# Patient Record
Sex: Female | Born: 1980 | Race: White | Hispanic: No | Marital: Married | Smoking: Former smoker
Health system: Southern US, Community
[De-identification: ages and names within clinical notes are randomized; demographics above are authoritative.]

## PROBLEM LIST (undated history)

## (undated) ENCOUNTER — Inpatient Hospital Stay (HOSPITAL_COMMUNITY): Payer: Self-pay

## (undated) DIAGNOSIS — R079 Chest pain, unspecified: Secondary | ICD-10-CM

## (undated) DIAGNOSIS — G8929 Other chronic pain: Secondary | ICD-10-CM

## (undated) DIAGNOSIS — F909 Attention-deficit hyperactivity disorder, unspecified type: Secondary | ICD-10-CM

## (undated) DIAGNOSIS — B019 Varicella without complication: Secondary | ICD-10-CM

## (undated) DIAGNOSIS — Z62810 Personal history of physical and sexual abuse in childhood: Secondary | ICD-10-CM

## (undated) DIAGNOSIS — D649 Anemia, unspecified: Secondary | ICD-10-CM

## (undated) DIAGNOSIS — R519 Headache, unspecified: Secondary | ICD-10-CM

## (undated) DIAGNOSIS — F419 Anxiety disorder, unspecified: Secondary | ICD-10-CM

## (undated) DIAGNOSIS — F411 Generalized anxiety disorder: Secondary | ICD-10-CM

## (undated) DIAGNOSIS — F329 Major depressive disorder, single episode, unspecified: Secondary | ICD-10-CM

## (undated) DIAGNOSIS — N390 Urinary tract infection, site not specified: Secondary | ICD-10-CM

## (undated) DIAGNOSIS — Z720 Tobacco use: Secondary | ICD-10-CM

## (undated) DIAGNOSIS — R202 Paresthesia of skin: Secondary | ICD-10-CM

## (undated) DIAGNOSIS — Z8489 Family history of other specified conditions: Secondary | ICD-10-CM

## (undated) HISTORY — DX: Headache, unspecified: R51.9

## (undated) HISTORY — DX: Attention-deficit hyperactivity disorder, unspecified type: F90.9

## (undated) HISTORY — PX: HERNIA REPAIR: SHX51

## (undated) HISTORY — DX: Personal history of physical and sexual abuse in childhood: Z62.810

## (undated) HISTORY — DX: Major depressive disorder, single episode, unspecified: F32.9

## (undated) HISTORY — DX: Urinary tract infection, site not specified: N39.0

## (undated) HISTORY — DX: Chest pain, unspecified: R07.9

## (undated) HISTORY — DX: Headache, unspecified: G89.29

## (undated) HISTORY — DX: Varicella without complication: B01.9

## (undated) HISTORY — DX: Generalized anxiety disorder: F41.1

## (undated) HISTORY — PX: WISDOM TOOTH EXTRACTION: SHX21

---
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
## 2002-02-08 ENCOUNTER — Encounter: Admission: RE | Admit: 2002-02-08 | Discharge: 2002-03-10 | Payer: Self-pay | Admitting: Obstetrics and Gynecology

## 2002-04-10 ENCOUNTER — Encounter: Admission: RE | Admit: 2002-04-10 | Discharge: 2002-05-10 | Payer: Self-pay | Admitting: Obstetrics and Gynecology

## 2002-05-11 ENCOUNTER — Encounter: Admission: RE | Admit: 2002-05-11 | Discharge: 2002-06-10 | Payer: Self-pay | Admitting: Obstetrics and Gynecology

## 2002-07-11 ENCOUNTER — Encounter: Admission: RE | Admit: 2002-07-11 | Discharge: 2002-08-10 | Payer: Self-pay | Admitting: Obstetrics and Gynecology

## 2003-05-06 ENCOUNTER — Emergency Department (HOSPITAL_COMMUNITY): Admission: EM | Admit: 2003-05-06 | Discharge: 2003-05-06 | Payer: Self-pay | Admitting: *Deleted

## 2003-06-05 ENCOUNTER — Emergency Department (HOSPITAL_COMMUNITY): Admission: EM | Admit: 2003-06-05 | Discharge: 2003-06-05 | Payer: Self-pay | Admitting: Emergency Medicine

## 2003-07-05 ENCOUNTER — Emergency Department (HOSPITAL_COMMUNITY): Admission: EM | Admit: 2003-07-05 | Discharge: 2003-07-05 | Payer: Self-pay

## 2003-10-22 ENCOUNTER — Emergency Department (HOSPITAL_COMMUNITY): Admission: EM | Admit: 2003-10-22 | Discharge: 2003-10-22 | Payer: Self-pay | Admitting: Family Medicine

## 2004-11-16 ENCOUNTER — Other Ambulatory Visit: Admission: RE | Admit: 2004-11-16 | Discharge: 2004-11-16 | Payer: Self-pay | Admitting: Obstetrics and Gynecology

## 2005-05-19 ENCOUNTER — Inpatient Hospital Stay (HOSPITAL_COMMUNITY): Admission: AD | Admit: 2005-05-19 | Discharge: 2005-05-20 | Payer: Self-pay | Admitting: Obstetrics and Gynecology

## 2005-06-02 ENCOUNTER — Inpatient Hospital Stay (HOSPITAL_COMMUNITY): Admission: AD | Admit: 2005-06-02 | Discharge: 2005-06-03 | Payer: Self-pay | Admitting: Obstetrics and Gynecology

## 2005-12-21 ENCOUNTER — Other Ambulatory Visit: Admission: RE | Admit: 2005-12-21 | Discharge: 2005-12-21 | Payer: Self-pay | Admitting: Obstetrics and Gynecology

## 2012-01-06 ENCOUNTER — Encounter: Payer: Self-pay | Admitting: Obstetrics and Gynecology

## 2012-01-06 ENCOUNTER — Ambulatory Visit (INDEPENDENT_AMBULATORY_CARE_PROVIDER_SITE_OTHER): Payer: Self-pay | Admitting: Obstetrics and Gynecology

## 2012-01-06 VITALS — BP 102/70 | Resp 16 | Ht 63.0 in | Wt 134.0 lb

## 2012-01-06 DIAGNOSIS — Z124 Encounter for screening for malignant neoplasm of cervix: Secondary | ICD-10-CM

## 2012-01-06 DIAGNOSIS — F341 Dysthymic disorder: Secondary | ICD-10-CM

## 2012-01-06 DIAGNOSIS — F329 Major depressive disorder, single episode, unspecified: Secondary | ICD-10-CM | POA: Insufficient documentation

## 2012-01-06 DIAGNOSIS — F418 Other specified anxiety disorders: Secondary | ICD-10-CM

## 2012-01-06 DIAGNOSIS — G43909 Migraine, unspecified, not intractable, without status migrainosus: Secondary | ICD-10-CM

## 2012-01-06 HISTORY — DX: Migraine, unspecified, not intractable, without status migrainosus: G43.909

## 2012-01-06 MED ORDER — NORETHIN ACE-ETH ESTRAD-FE 1-20 MG-MCG(24) PO TABS: 1.0000 | ORAL_TABLET | Freq: Every day | ORAL | Status: DC

## 2012-01-06 MED ORDER — IBUPROFEN 800 MG PO TABS: 800.0000 mg | ORAL_TABLET | Freq: Three times a day (TID) | ORAL | Status: AC | PRN

## 2012-01-06 MED ORDER — CLONAZEPAM 0.5 MG PO TABS: 0.5000 mg | ORAL_TABLET | Freq: Two times a day (BID) | ORAL | Status: DC | PRN

## 2012-01-06 NOTE — Progress Notes (Signed)
Subjective:    Heather [name] is a 31 y.o. female, No obstetric history on file., who presents for an annual exam.  No problems gynecologically, but did experience loss of father to sudden death 2 months ago.  Autopsy results are pending. Wants to restart OCPs--husband wants another baby, patient does not at this time.  Previously used LoEstrin 24 with good results. Menstrual migraines well-controlled with Ibuprophen 800 mg.  No current insurance--requests I refill Klonpin as well as Rx Loestrin and Ibuprophen, On OTC weight loss product "Oxylite".  Still smoking--working on quitting.   History   Social History  . Marital Status: Married    Spouse Name: N/A    Number of Children: N/A  . Years of Education: N/A   Social History Main Topics  . Smoking status: Current Everyday Smoker -- 1.0 packs/day    Types: Cigarettes  . Smokeless tobacco: Never Used  . Alcohol Use: No  . Drug Use: No  . Sexually Active: Yes    Birth Control/ Protection: None   Other Topics Concern  . None   Social History Narrative  . None    Menstrual cycle:   LMP: [name] last menstrual period was 12/19/2011.           Cycle: flow is moderate and Regular, monthly with normal flow and no severe dysmenorrha  The following portions of the patient's history were reviewed and updated as appropriate: allergies, current medications, past family history, past medical history, past social history, past surgical history and problem list.  Review of Systems Pertinent items are noted in HPI. Breast:Negative for breast lump,nipple discharge or nipple retraction Gastrointestinal: Negative for abdominal pain, change in bowel habits or rectal bleeding Urinary:negative   Objective:    BP 102/70  Resp 16  Ht 5\' 3"  (1.6 m)  Wt 134 lb (60.782 kg)  BMI 23.74 kg/m2  LMP 12/19/2011    Weight:  Wt Readings from Last 1 Encounters:  01/06/12 134 lb (60.782 kg)          BMI: Body mass index is 23.74 kg/(m^2).  General  Appearance: Alert, appropriate appearance for age. No acute distress HEENT: Grossly normal Neck / Thyroid: Supple, no masses, nodes or enlargement Lungs: clear to auscultation bilaterally Back: No CVA tenderness Breast Exam: No dimpling, nipple retraction or discharge. No masses or nodes. and No masses or nodes.No dimpling, nipple retraction or discharge. Cardiovascular: Regular rate and rhythm. S1, S2, no murmur Gastrointestinal: Soft, non-tender, no masses or organomegaly Pelvic Exam: Vulva and vagina appear normal. Bimanual exam reveals normal uterus and adnexa. Rectovaginal: not indicated and normal rectal, no masses Lymphatic Exam: Non-palpable nodes in neck, clavicular, axillary, or inguinal regions Skin: no rash or abnormalities Neurologic: Normal gait and speech, no tremor  Psychiatric: Alert and oriented, appropriate affect.   Wet Prep:not applicable Urinalysis:not applicable UPT: Not done   Assessment:    Normal gyn exam  Anxiety/depression--stable on Klonipin Menstrual migraines--stable on Ibuprophen Wants to restart OCP (Loestrin 24)   Plan:    pap smear return annually or prn STD screening: declined Contraception:oral contraceptives (estrogen/progesterone)--Rx Loestrin 24 x 1 year Rx Klonipin 0.05 mg po BID, #60, with 5 refills Rx Ibuprophen 800 mg po TID prn dysmenorrhea. Encouraged smoking cesation Support to patient for loss of father.      Doralyn Kirkes, VICKIMD

## 2012-01-06 NOTE — Progress Notes (Addendum)
Allergies: NKDA LMP: 12/19/2011 Last Pap: 2011/Normal Contraception: None Gravida: 2 Para: 2 Primary Doctor: None  ANNUAL EXAM  Regular Periods yes   Monthly Breast Ex. no Tetanus<10 years ? NI. Bladder Function yes Daily BM's no Healthy Diet no Calcium/Multi-vitamin no Mammogram no Date: Smoker yes How much? 1 pack a day Alcohol Abuse no How much? Substance Abuse no How much? Exercise yes How much? 3-4 x's weekly Seatbelts yes Abuse at Home no Stressful Work yes Sigmoid/Colonoscopy in past 5 years no Medications: Oxy-Elite diet supplement  Medical problems this year: None  PMH: None  FMH: Mother has neuropathy; Father deceased ( cause unknown)  ABDOMINAL/PELVIC PAIN no  VAGINAL DISCHARGE no  IRREGULAR BLEEDING no  MENOPAUSAL SYMPTOMS no

## 2012-01-10 LAB — PAP IG W/ RFLX HPV ASCU

## 2012-02-09 ENCOUNTER — Telehealth: Payer: Self-pay | Admitting: Obstetrics and Gynecology

## 2012-02-09 NOTE — Telephone Encounter (Signed)
TC to pt.   States just started OCP 2 weeks ago after having being off x 2 years.  Has had bleeding on and off using at most 2 tampons/day. Neg UPT x 2.   Explained may have irregular bleeding x 3 months. Has not used back up contraception. Advised to do so. Advised to continue pills.   May have full period during week of inactive pills.  To repeat UPT prior to starting new pack.  Pt verbalizes comprehension.

## 2012-02-09 NOTE — Telephone Encounter (Signed)
Triage/cht received 

## 2012-05-15 ENCOUNTER — Other Ambulatory Visit: Payer: Self-pay | Admitting: Obstetrics and Gynecology

## 2012-05-15 ENCOUNTER — Telehealth: Payer: Self-pay | Admitting: Obstetrics and Gynecology

## 2012-05-15 MED ORDER — NORETHIN ACE-ETH ESTRAD-FE 1-20 MG-MCG(24) PO TABS: 1.0000 | ORAL_TABLET | Freq: Every day | ORAL | Status: DC

## 2012-05-15 MED ORDER — NORETHIN ACE-ETH ESTRAD-FE 1-20 MG-MCG(24) PO CHEW: 1.0000 | CHEWABLE_TABLET | Freq: Every day | ORAL | Status: DC

## 2012-05-15 NOTE — Telephone Encounter (Signed)
Pt called regarding msg, informed Loestrin 24 has changed its name to Minastrin 24 Fe, pt states has missed 2 days of pills and says the pharmacy was not aware that Loestrin 24 Fe was discontinued.  Informed pt reordered rx as Minastrin and will send discount through the mail.  Pt also advised to use BUM while getting back on track, pt states she does not use BUMs, pt warned will be at risk of getting pregnant and she is aware of that.  Pt says if she gets pregnant,"she will sue the pharmacy b/c they did not tell her about the changes in the contraception.

## 2012-05-15 NOTE — Telephone Encounter (Signed)
TRIAGE/BC [facility]

## 2013-05-26 ENCOUNTER — Inpatient Hospital Stay (HOSPITAL_COMMUNITY)
Admission: AD | Admit: 2013-05-26 | Discharge: 2013-05-26 | Disposition: A | Discharge status: Home / Self Care | Payer: Self-pay | Source: Ambulatory Visit | Attending: Obstetrics and Gynecology | Admitting: Obstetrics and Gynecology

## 2013-05-26 ENCOUNTER — Encounter (HOSPITAL_COMMUNITY): Payer: Self-pay | Admitting: *Deleted

## 2013-05-26 DIAGNOSIS — O26852 Spotting complicating pregnancy, second trimester: Secondary | ICD-10-CM

## 2013-05-26 DIAGNOSIS — O26859 Spotting complicating pregnancy, unspecified trimester: Secondary | ICD-10-CM | POA: Insufficient documentation

## 2013-05-26 HISTORY — DX: Anxiety disorder, unspecified: F41.9

## 2013-05-26 LAB — WET PREP, GENITAL
Clue Cells Wet Prep HPF POC: NONE SEEN
Trich, Wet Prep: NONE SEEN
Yeast Wet Prep HPF POC: NONE SEEN

## 2013-05-26 NOTE — Discharge Instructions (Signed)
Vaginal Bleeding During Pregnancy, Second Trimester  A small amount of bleeding (spotting) is relatively common in pregnancy. It usually stops on its own. There are many causes for bleeding or spotting in pregnancy. Some bleeding may be related to the pregnancy and some may not. Cramping with the bleeding is more serious and concerning. Tell your caregiver if you have any vaginal bleeding.   CAUSES    Infection, inflammation or growths on the cervix.   The placenta may partially or completely be covering the opening of the cervix inside the uterus.   The placenta may have separated from the uterus.   You may be having early/preterm labor.   The cervix is not strong enough to keep a baby inside the uterus (cervical insufficiency).   Many tiny cysts in the uterus instead of pregnancy tissue (molar pregnancy)  SYMPTOMS    Vaginal spotting or bleeding with or without cramps.   Uterine contractions.   Abnormal vaginal discharge.   You may have spotting or spotting after having sexual intercourse.  DIAGNOSIS   To evaluate the pregnancy, your caregiver may:   Do a pelvic exam.   Take blood tests.   Do an ultrasound.  It is very important to follow your caregiver's instructions.   TREATMENT    Evaluation of the pregnancy with blood tests and ultrasound.   Bed rest (getting up to use the bathroom only).   Rho-gam immunization if the mother is Rh negative and the father is Rh positive.   If you are having uterine contractions, you may be given medication to stop the contractions.   If you have cervical insufficiency, you may have a suture placed in the cervix to close it.  HOME CARE INSTRUCTIONS    If your caregiver orders bed rest, you may need to make arrangements for the care of other children and for any other responsibilities. However, your caregiver may allow you to continue light activity.   Keep track of the number of pads you use each day and how soaked (saturated) they are. Write this down.   Do  not use tampons. Do not douche.   Do not have sexual intercourse or orgasms until approved by your physician.   Save any tissue that you pass for your caregiver to see.   Take medicine for cramps only with your caregiver's permission.   Do not take aspirin because it can make you bleed.   Do not exercise, do any strenuous activities or heavy lifting without your caregiver's permission.  SEEK IMMEDIATE MEDICAL CARE IF:    You experience severe cramps in your stomach, back or belly (abdomen).   You have uterine contractions.   You have an oral temperature above 102 F (38.9 C), not controlled by medicine.   You develop chills.   You pass large clots or tissue.   Your bleeding increases or you become light-headed, weak or have fainting episodes.   You have leaking or a gush of fluid from your vagina.  Document Released: 06/01/2005 Document Revised: 11/14/2011 Document Reviewed: 12/11/2008  [employer] Patient Information 2014 [employer].

## 2013-05-26 NOTE — MAU Provider Note (Signed)
History   32yo, [id number] at [redacted]w[redacted]d presents with a small amount of dark spotting after intercourse.  Spotting was accompanied by a small dark clot.  By the time she arrived to MAU bleeding had stopped. Pt also reports baby had not moved as much since intercourse, however moving well in MAU.  Denies cramping, UCs, recent fever, resp or GI c/o's, UTI or PIH s/s.   Chief Complaint  Patient presents with  . Vaginal Bleeding    OB History   Grav Para Term Preterm Abortions TAB SAB Ect Mult Living   3 2 2       2       Past Medical History  Diagnosis Date  . Anxiety     stopped meds with pregnancy    Past Surgical History  Procedure Laterality Date  . Hernia repair      when 8 wks old  . Wisdom tooth extraction      History reviewed. No pertinent family history.  History  Substance Use Topics  . Smoking status: Current Every Day Smoker -- 1.00 packs/day    Types: Cigarettes  . Smokeless tobacco: Never Used  . Alcohol Use: No    Allergies: No Known Allergies  No prescriptions prior to admission    ROS: see HPI above, all other systems are negative   Physical Exam   Blood pressure 106/52, pulse 66, temperature 97.8 F (36.6 C), resp. rate 20, height 5\' 3"  (1.6 m), weight 146 lb 9.6 oz (66.497 kg).  Chest: Clear Heart: RRR Abdomen: gravid, NT Extremities: WNL  Pelvic exam: normal external genitalia, vulva, vagina, cervix, uterus and adnexa.  Small amount of brown discharge noted in vaginal vault.  FHT: 140 Doppler UCs: none  ED Course  IUP at [redacted]w[redacted]d Spotting after IC  Wet prep - neg  D/c home with precautions F/u at already scheduled appt on Thursday  Haroldine Laws CNM, MSN 05/26/2013 1:46 AM
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
## 2013-07-20 ENCOUNTER — Encounter (HOSPITAL_COMMUNITY): Payer: Self-pay

## 2013-07-20 ENCOUNTER — Inpatient Hospital Stay (HOSPITAL_COMMUNITY)
Admission: AD | Admit: 2013-07-20 | Discharge: 2013-07-20 | Disposition: A | Discharge status: Home / Self Care | Payer: Medicaid Other | Source: Ambulatory Visit | Attending: Obstetrics & Gynecology | Admitting: Obstetrics & Gynecology

## 2013-07-20 DIAGNOSIS — O4690 Antepartum hemorrhage, unspecified, unspecified trimester: Secondary | ICD-10-CM | POA: Insufficient documentation

## 2013-07-20 DIAGNOSIS — O99330 Smoking (tobacco) complicating pregnancy, unspecified trimester: Secondary | ICD-10-CM | POA: Insufficient documentation

## 2013-07-20 DIAGNOSIS — N930 Postcoital and contact bleeding: Secondary | ICD-10-CM

## 2013-07-20 NOTE — MAU Provider Note (Signed)
  History     CSN: [id number]  Arrival date and time: 07/20/13 7829   First Provider Initiated Contact with Patient 07/20/13 0555      Chief Complaint  Patient presents with  . Vaginal Bleeding   HPI Ms Heather [name] is a 32yo G3P2002 at 29.1wks who presents for eval of post coital vag bldg tonight. Denies leaking, ctx, or pain. Reports +FM. Her preg has been followed by [facility] until approx 2 mos ago when she lost her job/insurance; has applied for MCD. Denies any concerns up until that point with her preg. Bld type O+ per pt.  OB History   Grav Para Term Preterm Abortions TAB SAB Ect Mult Living   3 2 2       2       Past Medical History  Diagnosis Date  . Anxiety     stopped meds with pregnancy    Past Surgical History  Procedure Laterality Date  . Hernia repair      when 8 wks old  . Wisdom tooth extraction      History reviewed. No pertinent family history.  History  Substance Use Topics  . Smoking status: Current Every Day Smoker -- 1.00 packs/day    Types: Cigarettes  . Smokeless tobacco: Never Used  . Alcohol Use: No    Allergies: No Known Allergies  Prescriptions prior to admission  Medication Sig Dispense Refill  . acetaminophen (TYLENOL) 500 MG tablet Take 500 mg by mouth every 6 (six) hours as needed for pain.      . Prenatal Vit-Fe Fumarate-FA (PRENATAL MULTIVITAMIN) TABS tablet Take 1 tablet by mouth daily at 12 noon.        ROS Physical Exam   Blood pressure 116/59, pulse 91, temperature 98.3 F (36.8 C), temperature source Oral, resp. rate 16, height 5\' 3"  (1.6 m), weight 69.967 kg (154 lb 4 oz), SpO2 98.00%.  Physical Exam  Constitutional: She is oriented to person, place, and time. She appears well-developed.  HENT:  Head: Normocephalic.  Neck: Normal range of motion.  Cardiovascular: Normal rate.   Respiratory: Effort normal.  GI:  EFM 125-135 +accels, no decels No ctx per toco  Genitourinary:  SE: sm red bld in vault; no active bldg; cx  C/L  Musculoskeletal: Normal range of motion.  Neurological: She is alert and oriented to person, place, and time.  Skin: Skin is warm and dry.  Psychiatric: She has a normal mood and affect. Her behavior is normal. Thought content normal.    MAU Course  Procedures    Assessment and Plan  IUP at 29.1wks Post coital bldg  D/C home with pelvic rest x 7d Instructed on what to expect as far as seeing old blood x next few days- return for ctx or BRB Rec to F/U with [facility] to reestablish [facility], but given # for [facility] office (boyfriend lives near there) if she is in need of a provider  Cam Hai 07/20/2013, 6:08 AM

## 2013-07-20 NOTE — MAU Note (Signed)
Patient is in with c/o vaginal bleeding while having intercourse. She does not have a pad on now, unable to assess bleeding. A pad given to patient. Patient denies any pain. She reports good fetal movement. Patient states that she last went to [facility] 2 months ago. She was dismissed from that practice because she lost her insurance and is awaiting medicaid.

## 2013-08-22 ENCOUNTER — Encounter: Payer: Self-pay | Admitting: Obstetrics and Gynecology

## 2013-08-22 ENCOUNTER — Ambulatory Visit (INDEPENDENT_AMBULATORY_CARE_PROVIDER_SITE_OTHER): Payer: Medicaid Other | Admitting: Obstetrics and Gynecology

## 2013-08-22 VITALS — BP 121/69 | Wt 156.2 lb

## 2013-08-22 DIAGNOSIS — O99333 Smoking (tobacco) complicating pregnancy, third trimester: Secondary | ICD-10-CM

## 2013-08-22 DIAGNOSIS — Z87891 Personal history of nicotine dependence: Secondary | ICD-10-CM | POA: Insufficient documentation

## 2013-08-22 DIAGNOSIS — Z3480 Encounter for supervision of other normal pregnancy, unspecified trimester: Secondary | ICD-10-CM

## 2013-08-22 DIAGNOSIS — Z3483 Encounter for supervision of other normal pregnancy, third trimester: Secondary | ICD-10-CM

## 2013-08-22 DIAGNOSIS — Z720 Tobacco use: Secondary | ICD-10-CM

## 2013-08-22 DIAGNOSIS — O99334 Smoking (tobacco) complicating childbirth: Secondary | ICD-10-CM

## 2013-08-22 DIAGNOSIS — F17200 Nicotine dependence, unspecified, uncomplicated: Secondary | ICD-10-CM

## 2013-08-22 DIAGNOSIS — O99330 Smoking (tobacco) complicating pregnancy, unspecified trimester: Secondary | ICD-10-CM | POA: Insufficient documentation

## 2013-08-22 HISTORY — DX: Tobacco use: Z72.0

## 2013-08-22 NOTE — Progress Notes (Signed)
Patient transferred care from [facility] at 33w6. Patient doing well without complaints. Reports uncomplicated prenatal care. Some records available. Patient has not had prenatal care since late August. She reports an incomplete anatomy ultrasound as she was suppose to follow up with them to complete ultrasound but lost her insurance. Patient has not had 1 hr glucola test today and will return tomorrow to have it done. Patient reports some [facility] and would like to have her cervix checked today. She is very anxious as this is her first pregnancy with her current boyfriend and her last child is 21 years old. Reassurance provided. Anatomy ultrasound ordered

## 2013-08-22 NOTE — Progress Notes (Signed)
p-80 

## 2013-08-23 ENCOUNTER — Other Ambulatory Visit (INDEPENDENT_AMBULATORY_CARE_PROVIDER_SITE_OTHER): Payer: Medicaid Other | Admitting: *Deleted

## 2013-08-23 DIAGNOSIS — Z3480 Encounter for supervision of other normal pregnancy, unspecified trimester: Secondary | ICD-10-CM

## 2013-08-23 DIAGNOSIS — Z3403 Encounter for supervision of normal first pregnancy, third trimester: Secondary | ICD-10-CM

## 2013-08-23 NOTE — Progress Notes (Unsigned)
Pt here today for 1 hr GTT.

## 2013-08-24 LAB — CBC WITH DIFFERENTIAL/PLATELET
Basophils Relative: 0 % (ref 0–1)
Eosinophils Absolute: 0.2 10*3/uL (ref 0.0–0.7)
Hemoglobin: 11 g/dL — ABNORMAL LOW (ref 12.0–15.0)
Lymphs Abs: 2.2 10*3/uL (ref 0.7–4.0)
MCH: 30.3 pg (ref 26.0–34.0)
MCV: 88.4 fL (ref 78.0–100.0)
Monocytes Absolute: 1.1 10*3/uL — ABNORMAL HIGH (ref 0.1–1.0)
Monocytes Relative: 11 % (ref 3–12)
Neutrophils Relative %: 65 % (ref 43–77)
RBC: 3.63 MIL/uL — ABNORMAL LOW (ref 3.87–5.11)

## 2013-08-24 LAB — GLUCOSE TOLERANCE, 1 HOUR (50G) W/O FASTING: Glucose, 1 Hour GTT: 106 mg/dL (ref 70–140)

## 2013-08-24 LAB — RPR

## 2013-08-27 ENCOUNTER — Encounter: Payer: Self-pay | Admitting: Obstetrics and Gynecology

## 2013-08-27 ENCOUNTER — Ambulatory Visit (HOSPITAL_COMMUNITY)
Admission: RE | Admit: 2013-08-27 | Discharge: 2013-08-27 | Disposition: A | Discharge status: Home / Self Care | Payer: Medicaid Other | Source: Ambulatory Visit | Attending: Obstetrics and Gynecology | Admitting: Obstetrics and Gynecology

## 2013-08-27 DIAGNOSIS — Z3483 Encounter for supervision of other normal pregnancy, third trimester: Secondary | ICD-10-CM

## 2013-08-27 DIAGNOSIS — O0930 Supervision of pregnancy with insufficient antenatal care, unspecified trimester: Secondary | ICD-10-CM | POA: Insufficient documentation

## 2013-08-27 DIAGNOSIS — Z3689 Encounter for other specified antenatal screening: Secondary | ICD-10-CM | POA: Insufficient documentation

## 2013-09-03 ENCOUNTER — Encounter: Payer: Self-pay | Admitting: Family Medicine

## 2013-09-03 ENCOUNTER — Ambulatory Visit (INDEPENDENT_AMBULATORY_CARE_PROVIDER_SITE_OTHER): Payer: Medicaid Other | Admitting: Family Medicine

## 2013-09-03 VITALS — BP 106/56 | Wt 156.0 lb

## 2013-09-03 DIAGNOSIS — Z20828 Contact with and (suspected) exposure to other viral communicable diseases: Secondary | ICD-10-CM

## 2013-09-03 DIAGNOSIS — Z3483 Encounter for supervision of other normal pregnancy, third trimester: Secondary | ICD-10-CM

## 2013-09-03 DIAGNOSIS — Z23 Encounter for immunization: Secondary | ICD-10-CM

## 2013-09-03 DIAGNOSIS — Z3480 Encounter for supervision of other normal pregnancy, unspecified trimester: Secondary | ICD-10-CM

## 2013-09-03 MED ORDER — OSELTAMIVIR PHOSPHATE 75 MG PO CAPS: 75.0000 mg | ORAL_CAPSULE | Freq: Two times a day (BID) | ORAL | Status: DC

## 2013-09-03 NOTE — Progress Notes (Signed)
TDaP and Flu shot today Labor precautions 28 wks labs and sonogram reviewed and WNL. Cultures next visit. Tamiflu sent over for pt, as her son is being treated for this.

## 2013-09-03 NOTE — Patient Instructions (Signed)
Third Trimester of Pregnancy The third trimester is from week 29 through week 42, months 7 through 9. The third trimester is a time when the fetus is growing rapidly. At the end of the ninth month, the fetus is about 20 inches in length and weighs 6 10 pounds.  BODY CHANGES Your body goes through many changes during pregnancy. The changes vary from woman to woman.   Your weight will continue to increase. You can expect to gain 25 35 pounds (11 16 kg) by the end of the pregnancy.  You may begin to get stretch marks on your hips, abdomen, and breasts.  You may urinate more often because the fetus is moving lower into your pelvis and pressing on your bladder.  You may develop or continue to have heartburn as a result of your pregnancy.  You may develop constipation because certain hormones are causing the muscles that push waste through your intestines to slow down.  You may develop hemorrhoids or swollen, bulging veins (varicose veins).  You may have pelvic pain because of the weight gain and pregnancy hormones relaxing your joints between the bones in your pelvis. Back aches may result from over exertion of the muscles supporting your posture.  Your breasts will continue to grow and be tender. A yellow discharge may leak from your breasts called colostrum.  Your belly button may stick out.  You may feel short of breath because of your expanding uterus.  You may notice the fetus "dropping," or moving lower in your abdomen.  You may have a bloody mucus discharge. This usually occurs a few days to a week before labor begins.  Your cervix becomes thin and soft (effaced) near your due date. WHAT TO EXPECT AT YOUR PRENATAL EXAMS  You will have prenatal exams every 2 weeks until week 36. Then, you will have weekly prenatal exams. During a routine prenatal visit:  You will be weighed to make sure you and the fetus are growing normally.  Your blood pressure is taken.  Your abdomen will  be measured to track your baby's growth.  The fetal heartbeat will be listened to.  Any test results from the previous visit will be discussed.  You may have a cervical check near your due date to see if you have effaced. At around 36 weeks, your caregiver will check your cervix. At the same time, your caregiver will also perform a test on the secretions of the vaginal tissue. This test is to determine if a type of bacteria, Group B streptococcus, is present. Your caregiver will explain this further. Your caregiver may ask you:  What your birth plan is.  How you are feeling.  If you are feeling the baby move.  If you have had any abnormal symptoms, such as leaking fluid, bleeding, severe headaches, or abdominal cramping.  If you have any questions. Other tests or screenings that may be performed during your third trimester include:  Blood tests that check for low iron levels (anemia).  Fetal testing to check the health, activity level, and growth of the fetus. Testing is done if you have certain medical conditions or if there are problems during the pregnancy. FALSE LABOR You may feel small, irregular contractions that eventually go away. These are called Braxton Hicks contractions, or false labor. Contractions may last for hours, days, or even weeks before true labor sets in. If contractions come at regular intervals, intensify, or become painful, it is best to be seen by your caregiver.    SIGNS OF LABOR   Menstrual-like cramps.  Contractions that are 5 minutes apart or less.  Contractions that start on the top of the uterus and spread down to the lower abdomen and back.  A sense of increased pelvic pressure or back pain.  A watery or bloody mucus discharge that comes from the vagina. If you have any of these signs before the 37th week of pregnancy, call your caregiver right away. You need to go to the hospital to get checked immediately. HOME CARE INSTRUCTIONS   Avoid all  smoking, herbs, alcohol, and unprescribed drugs. These chemicals affect the formation and growth of the baby.  Follow your caregiver's instructions regarding medicine use. There are medicines that are either safe or unsafe to take during pregnancy.  Exercise only as directed by your caregiver. Experiencing uterine cramps is a good sign to stop exercising.  Continue to eat regular, healthy meals.  Wear a good support bra for breast tenderness.  Do not use hot tubs, steam rooms, or saunas.  Wear your seat belt at all times when driving.  Avoid raw meat, uncooked cheese, cat litter boxes, and soil used by cats. These carry germs that can cause birth defects in the baby.  Take your prenatal vitamins.  Try taking a stool softener (if your caregiver approves) if you develop constipation. Eat more high-fiber foods, such as fresh vegetables or fruit and whole grains. Drink plenty of fluids to keep your urine clear or pale yellow.  Take warm sitz baths to soothe any pain or discomfort caused by hemorrhoids. Use hemorrhoid cream if your caregiver approves.  If you develop varicose veins, wear support hose. Elevate your feet for 15 minutes, 3 4 times a day. Limit salt in your diet.  Avoid heavy lifting, wear low heal shoes, and practice good posture.  Rest a lot with your legs elevated if you have leg cramps or low back pain.  Visit your dentist if you have not gone during your pregnancy. Use a soft toothbrush to brush your teeth and be gentle when you floss.  A sexual relationship may be continued unless your caregiver directs you otherwise.  Do not travel far distances unless it is absolutely necessary and only with the approval of your caregiver.  Take prenatal classes to understand, practice, and ask questions about the labor and delivery.  Make a trial run to the hospital.  Pack your hospital bag.  Prepare the baby's nursery.  Continue to go to all your prenatal visits as directed  by your caregiver. SEEK MEDICAL CARE IF:  You are unsure if you are in labor or if your water has broken.  You have dizziness.  You have mild pelvic cramps, pelvic pressure, or nagging pain in your abdominal area.  You have persistent nausea, vomiting, or diarrhea.  You have a bad smelling vaginal discharge.  You have pain with urination. SEEK IMMEDIATE MEDICAL CARE IF:   You have a fever.  You are leaking fluid from your vagina.  You have spotting or bleeding from your vagina.  You have severe abdominal cramping or pain.  You have rapid weight loss or gain.  You have shortness of breath with chest pain.  You notice sudden or extreme swelling of your face, hands, ankles, feet, or legs.  You have not felt your baby move in over an hour.  You have severe headaches that do not go away with medicine.  You have vision changes. Document Released: 08/16/2001 Document Revised: 04/24/2013 Document Reviewed:   10/23/2012 [employer] Patient Information 2014 [employer].  Breastfeeding Deciding to breastfeed is one of the best choices you can make for you and your baby. A change in hormones during pregnancy causes your breast tissue to grow and increases the number and size of your milk ducts. These hormones also allow proteins, sugars, and fats from your blood supply to make breast milk in your milk-producing glands. Hormones prevent breast milk from being released before your baby is born as well as prompt milk flow after birth. Once breastfeeding has begun, thoughts of your baby, as well as his or her sucking or crying, can stimulate the release of milk from your milk-producing glands.  BENEFITS OF BREASTFEEDING For Your Baby  Your first milk (colostrum) helps your baby's digestive system function better.   There are antibodies in your milk that help your baby fight off infections.   Your baby has a lower incidence of asthma, allergies, and sudden infant death syndrome.    The nutrients in breast milk are better for your baby than infant formulas and are designed uniquely for your baby's needs.   Breast milk improves your baby's brain development.   Your baby is less likely to develop other conditions, such as childhood obesity, asthma, or type 2 diabetes mellitus.  For You   Breastfeeding helps to create a very special bond between you and your baby.   Breastfeeding is convenient. Breast milk is always available at the correct temperature and costs nothing.   Breastfeeding helps to burn calories and helps you lose the weight gained during pregnancy.   Breastfeeding makes your uterus contract to its prepregnancy size faster and slows bleeding (lochia) after you give birth.   Breastfeeding helps to lower your risk of developing type 2 diabetes mellitus, osteoporosis, and breast or ovarian cancer later in life. SIGNS THAT YOUR BABY IS HUNGRY Early Signs of Hunger  Increased alertness or activity.  Stretching.  Movement of the head from side to side.  Movement of the head and opening of the mouth when the corner of the mouth or cheek is stroked (rooting).  Increased sucking sounds, smacking lips, cooing, sighing, or squeaking.  Hand-to-mouth movements.  Increased sucking of fingers or hands. Late Signs of Hunger  Fussing.  Intermittent crying. Extreme Signs of Hunger Signs of extreme hunger will require calming and consoling before your baby will be able to breastfeed successfully. Do not wait for the following signs of extreme hunger to occur before you initiate breastfeeding:   Restlessness.  A loud, strong cry.   Screaming. BREASTFEEDING BASICS Breastfeeding Initiation  Find a comfortable place to sit or lie down, with your neck and back well supported.  Place a pillow or rolled up blanket under your baby to bring him or her to the level of your breast (if you are seated). Nursing pillows are specially designed to help  support your arms and your baby while you breastfeed.  Make sure that your baby's abdomen is facing your abdomen.   Gently massage your breast. With your fingertips, massage from your chest wall toward your nipple in a circular motion. This encourages milk flow. You may need to continue this action during the feeding if your milk flows slowly.  Support your breast with 4 fingers underneath and your thumb above your nipple. Make sure your fingers are well away from your nipple and your baby's mouth.   Stroke your baby's lips gently with your finger or nipple.   When your baby's mouth is   open wide enough, quickly bring your baby to your breast, placing your entire nipple and as much of the colored area around your nipple (areola) as possible into your baby's mouth.   More areola should be visible above your baby's upper lip than below the lower lip.   Your baby's tongue should be between his or her lower gum and your breast.   Ensure that your baby's mouth is correctly positioned around your nipple (latched). Your baby's lips should create a seal on your breast and be turned out (everted).  It is common for your baby to suck about 2 3 minutes in order to start the flow of breast milk. Latching Teaching your baby how to latch on to your breast properly is very important. An improper latch can cause nipple pain and decreased milk supply for you and poor weight gain in your baby. Also, if your baby is not latched onto your nipple properly, he or she may swallow some air during feeding. This can make your baby fussy. Burping your baby when you switch breasts during the feeding can help to get rid of the air. However, teaching your baby to latch on properly is still the best way to prevent fussiness from swallowing air while breastfeeding. Signs that your baby has successfully latched on to your nipple:    Silent tugging or silent sucking, without causing you pain.   Swallowing heard  between every 3 4 sucks.    Muscle movement above and in front of his or her ears while sucking.  Signs that your baby has not successfully latched on to nipple:   Sucking sounds or smacking sounds from your baby while breastfeeding.  Nipple pain. If you think your baby has not latched on correctly, slip your finger into the corner of your baby's mouth to break the suction and place it between your baby's gums. Attempt breastfeeding initiation again. Signs of Successful Breastfeeding Signs from your baby:   A gradual decrease in the number of sucks or complete cessation of sucking.   Falling asleep.   Relaxation of his or her body.   Retention of a small amount of milk in his or her mouth.   Letting go of your breast by himself or herself. Signs from you:  Breasts that have increased in firmness, weight, and size 1 3 hours after feeding.   Breasts that are softer immediately after breastfeeding.  Increased milk volume, as well as a change in milk consistency and color by the 5th day of breastfeeding.   Nipples that are not sore, cracked, or bleeding. Signs That Your Baby is Getting Enough Milk  Wetting at least 3 diapers in a 24-hour period. The urine should be clear and pale yellow by age 5 days.  At least 3 stools in a 24-hour period by age 5 days. The stool should be soft and yellow.  At least 3 stools in a 24-hour period by age 7 days. The stool should be seedy and yellow.  No loss of weight greater than 10% of birth weight during the first 3 days of age.  Average weight gain of 4 7 ounces (120 210 mL) per week after age 4 days.  Consistent daily weight gain by age 5 days, without weight loss after the age of 2 weeks. After a feeding, your baby may spit up a small amount. This is common. BREASTFEEDING FREQUENCY AND DURATION Frequent feeding will help you make more milk and can prevent sore nipples and breast engorgement.   Breastfeed when you feel the need to  reduce the fullness of your breasts or when your baby shows signs of hunger. This is called "breastfeeding on demand." Avoid introducing a pacifier to your baby while you are working to establish breastfeeding (the first 4 6 weeks after your baby is born). After this time you may choose to use a pacifier. Research has shown that pacifier use during the first year of a baby's life decreases the risk of sudden infant death syndrome (SIDS). Allow your baby to feed on each breast as long as he or she wants. Breastfeed until your baby is finished feeding. When your baby unlatches or falls asleep while feeding from the first breast, offer the second breast. Because newborns are often sleepy in the first few weeks of life, you may need to awaken your baby to get him or her to feed. Breastfeeding times will vary from baby to baby. However, the following rules can serve as a guide to help you ensure that your baby is properly fed:  Newborns (babies 4 weeks of age or younger) may breastfeed every 1 3 hours.  Newborns should not go longer than 3 hours during the day or 5 hours during the night without breastfeeding.  You should breastfeed your baby a minimum of 8 times in a 24-hour period until you begin to introduce solid foods to your baby at around 6 months of age. BREAST MILK PUMPING Pumping and storing breast milk allows you to ensure that your baby is exclusively fed your breast milk, even at times when you are unable to breastfeed. This is especially important if you are going back to work while you are still breastfeeding or when you are not able to be present during feedings. Your lactation consultant can give you guidelines on how long it is safe to store breast milk.  A breast pump is a machine that allows you to pump milk from your breast into a sterile bottle. The pumped breast milk can then be stored in a refrigerator or freezer. Some breast pumps are operated by hand, while others use electricity. Ask  your lactation consultant which type will work best for you. Breast pumps can be purchased, but some hospitals and breastfeeding support groups lease breast pumps on a monthly basis. A lactation consultant can teach you how to hand express breast milk, if you prefer not to use a pump.  CARING FOR YOUR BREASTS WHILE YOU BREASTFEED Nipples can become dry, cracked, and sore while breastfeeding. The following recommendations can help keep your breasts moisturized and healthy:  Avoid using soap on your nipples.   Wear a supportive bra. Although not required, special nursing bras and tank tops are designed to allow access to your breasts for breastfeeding without taking off your entire bra or top. Avoid wearing underwire style bras or extremely tight bras.  Air dry your nipples for 3 4minutes after each feeding.   Use only cotton bra pads to absorb leaked breast milk. Leaking of breast milk between feedings is normal.   Use lanolin on your nipples after breastfeeding. Lanolin helps to maintain your skin's normal moisture barrier. If you use pure lanolin you do not need to wash it off before feeding your baby again. Pure lanolin is not toxic to your baby. You may also hand express a few drops of breast milk and gently massage that milk into your nipples and allow the milk to air dry. In the first few weeks after giving birth, some women   experience extremely full breasts (engorgement). Engorgement can make your breasts feel heavy, warm, and tender to the touch. Engorgement peaks within 3 5 days after you give birth. The following recommendations can help ease engorgement:  Completely empty your breasts while breastfeeding or pumping. You may want to start by applying warm, moist heat (in the shower or with warm water-soaked hand towels) just before feeding or pumping. This increases circulation and helps the milk flow. If your baby does not completely empty your breasts while breastfeeding, pump any extra  milk after he or she is finished.  Wear a snug bra (nursing or regular) or tank top for 1 2 days to signal your body to slightly decrease milk production.  Apply ice packs to your breasts, unless this is too uncomfortable for you.  Make sure that your baby is latched on and positioned properly while breastfeeding. If engorgement persists after 48 hours of following these recommendations, contact your health care provider or a lactation consultant. OVERALL HEALTH CARE RECOMMENDATIONS WHILE BREASTFEEDING  Eat healthy foods. Alternate between meals and snacks, eating 3 of each per day. Because what you eat affects your breast milk, some of the foods may make your baby more irritable than usual. Avoid eating these foods if you are sure that they are negatively affecting your baby.  Drink milk, fruit juice, and water to satisfy your thirst (about 10 glasses a day).   Rest often, relax, and continue to take your prenatal vitamins to prevent fatigue, stress, and anemia.  Continue breast self-awareness checks.  Avoid chewing and smoking tobacco.  Avoid alcohol and drug use. Some medicines that may be harmful to your baby can pass through breast milk. It is important to ask your health care provider before taking any medicine, including all over-the-counter and prescription medicine as well as vitamin and herbal supplements. It is possible to become pregnant while breastfeeding. If birth control is desired, ask your health care provider about options that will be safe for your baby. SEEK MEDICAL CARE IF:   You feel like you want to stop breastfeeding or have become frustrated with breastfeeding.  You have painful breasts or nipples.  Your nipples are cracked or bleeding.  Your breasts are red, tender, or warm.  You have a swollen area on either breast.  You have a fever or chills.  You have nausea or vomiting.  You have drainage other than breast milk from your nipples.  Your breasts  do not become full before feedings by the 5th day after you give birth.  You feel sad and depressed.  Your baby is too sleepy to eat well.  Your baby is having trouble sleeping.   Your baby is wetting less than 3 diapers in a 24-hour period.  Your baby has less than 3 stools in a 24-hour period.  Your baby's skin or the white part of his or her eyes becomes yellow.   Your baby is not gaining weight by 5 days of age. SEEK IMMEDIATE MEDICAL CARE IF:   Your baby is overly tired (lethargic) and does not want to wake up and feed.  Your baby develops an unexplained fever. Document Released: 08/22/2005 Document Revised: 04/24/2013 Document Reviewed: 02/13/2013 [employer] Patient Information 2014 [employer].  

## 2013-09-03 NOTE — Progress Notes (Signed)
P = 76 

## 2013-09-05 NOTE — L&D Delivery Note (Signed)
Delivery Note At 7:45 AM a viable and healthy female was delivered via Vaginal, Spontaneous Delivery (Presentation: ; Occiput Anterior).  APGAR: 9, 9; weight .   Placenta status: Intact, Spontaneous.  Cord: 3 vessels with the following complications: None.   Anesthesia: Epidural  Episiotomy: None Lacerations: 1st degree;Labial Suture Repair: 3.0 vicryl rapide Est. Blood Loss (mL): 200  Normal delivery over intact peritoneum. Minor lower labial tear was hemostatic but repaired for cosmesis. Mom to postpartum.  Baby to Couplet care / Skin to Skin. Placenta to birthing suites.  Beverely Lowdamo, [name] 10/02/2013, 8:12 AM   I was present for and supervised the delivery of this newborn. I agree with above.   Lam Bjorklund, Redmond BasemanKELI L, MD

## 2013-09-10 ENCOUNTER — Encounter: Payer: Self-pay | Admitting: Family Medicine

## 2013-09-10 ENCOUNTER — Encounter: Payer: Medicaid Other | Admitting: Family Medicine

## 2013-09-10 ENCOUNTER — Ambulatory Visit (INDEPENDENT_AMBULATORY_CARE_PROVIDER_SITE_OTHER): Payer: Medicaid Other | Admitting: Family Medicine

## 2013-09-10 VITALS — BP 115/65 | Wt 157.8 lb

## 2013-09-10 DIAGNOSIS — Z3480 Encounter for supervision of other normal pregnancy, unspecified trimester: Secondary | ICD-10-CM

## 2013-09-10 DIAGNOSIS — Z3483 Encounter for supervision of other normal pregnancy, third trimester: Secondary | ICD-10-CM

## 2013-09-10 LAB — OB RESULTS CONSOLE GC/CHLAMYDIA
CHLAMYDIA, DNA PROBE: NEGATIVE
GC PROBE AMP, GENITAL: NEGATIVE

## 2013-09-10 LAB — OB RESULTS CONSOLE GBS: STREP GROUP B AG: NEGATIVE

## 2013-09-10 NOTE — Patient Instructions (Signed)
Third Trimester of Pregnancy The third trimester is from week 29 through week 42, months 7 through 9. The third trimester is a time when the fetus is growing rapidly. At the end of the ninth month, the fetus is about 20 inches in length and weighs 6 10 pounds.  BODY CHANGES Your body goes through many changes during pregnancy. The changes vary from woman to woman.   Your weight will continue to increase. You can expect to gain 25 35 pounds (11 16 kg) by the end of the pregnancy.  You may begin to get stretch marks on your hips, abdomen, and breasts.  You may urinate more often because the fetus is moving lower into your pelvis and pressing on your bladder.  You may develop or continue to have heartburn as a result of your pregnancy.  You may develop constipation because certain hormones are causing the muscles that push waste through your intestines to slow down.  You may develop hemorrhoids or swollen, bulging veins (varicose veins).  You may have pelvic pain because of the weight gain and pregnancy hormones relaxing your joints between the bones in your pelvis. Back aches may result from over exertion of the muscles supporting your posture.  Your breasts will continue to grow and be tender. A yellow discharge may leak from your breasts called colostrum.  Your belly button may stick out.  You may feel short of breath because of your expanding uterus.  You may notice the fetus "dropping," or moving lower in your abdomen.  You may have a bloody mucus discharge. This usually occurs a few days to a week before labor begins.  Your cervix becomes thin and soft (effaced) near your due date. WHAT TO EXPECT AT YOUR PRENATAL EXAMS  You will have prenatal exams every 2 weeks until week 36. Then, you will have weekly prenatal exams. During a routine prenatal visit:  You will be weighed to make sure you and the fetus are growing normally.  Your blood pressure is taken.  Your abdomen will  be measured to track your baby's growth.  The fetal heartbeat will be listened to.  Any test results from the previous visit will be discussed.  You may have a cervical check near your due date to see if you have effaced. At around 36 weeks, your caregiver will check your cervix. At the same time, your caregiver will also perform a test on the secretions of the vaginal tissue. This test is to determine if a type of bacteria, Group B streptococcus, is present. Your caregiver will explain this further. Your caregiver may ask you:  What your birth plan is.  How you are feeling.  If you are feeling the baby move.  If you have had any abnormal symptoms, such as leaking fluid, bleeding, severe headaches, or abdominal cramping.  If you have any questions. Other tests or screenings that may be performed during your third trimester include:  Blood tests that check for low iron levels (anemia).  Fetal testing to check the health, activity level, and growth of the fetus. Testing is done if you have certain medical conditions or if there are problems during the pregnancy. FALSE LABOR You may feel small, irregular contractions that eventually go away. These are called Braxton Hicks contractions, or false labor. Contractions may last for hours, days, or even weeks before true labor sets in. If contractions come at regular intervals, intensify, or become painful, it is best to be seen by your caregiver.    SIGNS OF LABOR   Menstrual-like cramps.  Contractions that are 5 minutes apart or less.  Contractions that start on the top of the uterus and spread down to the lower abdomen and back.  A sense of increased pelvic pressure or back pain.  A watery or bloody mucus discharge that comes from the vagina. If you have any of these signs before the 37th week of pregnancy, call your caregiver right away. You need to go to the hospital to get checked immediately. HOME CARE INSTRUCTIONS   Avoid all  smoking, herbs, alcohol, and unprescribed drugs. These chemicals affect the formation and growth of the baby.  Follow your caregiver's instructions regarding medicine use. There are medicines that are either safe or unsafe to take during pregnancy.  Exercise only as directed by your caregiver. Experiencing uterine cramps is a good sign to stop exercising.  Continue to eat regular, healthy meals.  Wear a good support bra for breast tenderness.  Do not use hot tubs, steam rooms, or saunas.  Wear your seat belt at all times when driving.  Avoid raw meat, uncooked cheese, cat litter boxes, and soil used by cats. These carry germs that can cause birth defects in the baby.  Take your prenatal vitamins.  Try taking a stool softener (if your caregiver approves) if you develop constipation. Eat more high-fiber foods, such as fresh vegetables or fruit and whole grains. Drink plenty of fluids to keep your urine clear or pale yellow.  Take warm sitz baths to soothe any pain or discomfort caused by hemorrhoids. Use hemorrhoid cream if your caregiver approves.  If you develop varicose veins, wear support hose. Elevate your feet for 15 minutes, 3 4 times a day. Limit salt in your diet.  Avoid heavy lifting, wear low heal shoes, and practice good posture.  Rest a lot with your legs elevated if you have leg cramps or low back pain.  Visit your dentist if you have not gone during your pregnancy. Use a soft toothbrush to brush your teeth and be gentle when you floss.  A sexual relationship may be continued unless your caregiver directs you otherwise.  Do not travel far distances unless it is absolutely necessary and only with the approval of your caregiver.  Take prenatal classes to understand, practice, and ask questions about the labor and delivery.  Make a trial run to the hospital.  Pack your hospital bag.  Prepare the baby's nursery.  Continue to go to all your prenatal visits as directed  by your caregiver. SEEK MEDICAL CARE IF:  You are unsure if you are in labor or if your water has broken.  You have dizziness.  You have mild pelvic cramps, pelvic pressure, or nagging pain in your abdominal area.  You have persistent nausea, vomiting, or diarrhea.  You have a bad smelling vaginal discharge.  You have pain with urination. SEEK IMMEDIATE MEDICAL CARE IF:   You have a fever.  You are leaking fluid from your vagina.  You have spotting or bleeding from your vagina.  You have severe abdominal cramping or pain.  You have rapid weight loss or gain.  You have shortness of breath with chest pain.  You notice sudden or extreme swelling of your face, hands, ankles, feet, or legs.  You have not felt your baby move in over an hour.  You have severe headaches that do not go away with medicine.  You have vision changes. Document Released: 08/16/2001 Document Revised: 04/24/2013 Document Reviewed:   10/23/2012 [employer] Patient Information 2014 [employer].  Breastfeeding Deciding to breastfeed is one of the best choices you can make for you and your baby. A change in hormones during pregnancy causes your breast tissue to grow and increases the number and size of your milk ducts. These hormones also allow proteins, sugars, and fats from your blood supply to make breast milk in your milk-producing glands. Hormones prevent breast milk from being released before your baby is born as well as prompt milk flow after birth. Once breastfeeding has begun, thoughts of your baby, as well as his or her sucking or crying, can stimulate the release of milk from your milk-producing glands.  BENEFITS OF BREASTFEEDING For Your Baby  Your first milk (colostrum) helps your baby's digestive system function better.   There are antibodies in your milk that help your baby fight off infections.   Your baby has a lower incidence of asthma, allergies, and sudden infant death syndrome.    The nutrients in breast milk are better for your baby than infant formulas and are designed uniquely for your baby's needs.   Breast milk improves your baby's brain development.   Your baby is less likely to develop other conditions, such as childhood obesity, asthma, or type 2 diabetes mellitus.  For You   Breastfeeding helps to create a very special bond between you and your baby.   Breastfeeding is convenient. Breast milk is always available at the correct temperature and costs nothing.   Breastfeeding helps to burn calories and helps you lose the weight gained during pregnancy.   Breastfeeding makes your uterus contract to its prepregnancy size faster and slows bleeding (lochia) after you give birth.   Breastfeeding helps to lower your risk of developing type 2 diabetes mellitus, osteoporosis, and breast or ovarian cancer later in life. SIGNS THAT YOUR BABY IS HUNGRY Early Signs of Hunger  Increased alertness or activity.  Stretching.  Movement of the head from side to side.  Movement of the head and opening of the mouth when the corner of the mouth or cheek is stroked (rooting).  Increased sucking sounds, smacking lips, cooing, sighing, or squeaking.  Hand-to-mouth movements.  Increased sucking of fingers or hands. Late Signs of Hunger  Fussing.  Intermittent crying. Extreme Signs of Hunger Signs of extreme hunger will require calming and consoling before your baby will be able to breastfeed successfully. Do not wait for the following signs of extreme hunger to occur before you initiate breastfeeding:   Restlessness.  A loud, strong cry.   Screaming. BREASTFEEDING BASICS Breastfeeding Initiation  Find a comfortable place to sit or lie down, with your neck and back well supported.  Place a pillow or rolled up blanket under your baby to bring him or her to the level of your breast (if you are seated). Nursing pillows are specially designed to help  support your arms and your baby while you breastfeed.  Make sure that your baby's abdomen is facing your abdomen.   Gently massage your breast. With your fingertips, massage from your chest wall toward your nipple in a circular motion. This encourages milk flow. You may need to continue this action during the feeding if your milk flows slowly.  Support your breast with 4 fingers underneath and your thumb above your nipple. Make sure your fingers are well away from your nipple and your baby's mouth.   Stroke your baby's lips gently with your finger or nipple.   When your baby's mouth is   open wide enough, quickly bring your baby to your breast, placing your entire nipple and as much of the colored area around your nipple (areola) as possible into your baby's mouth.   More areola should be visible above your baby's upper lip than below the lower lip.   Your baby's tongue should be between his or her lower gum and your breast.   Ensure that your baby's mouth is correctly positioned around your nipple (latched). Your baby's lips should create a seal on your breast and be turned out (everted).  It is common for your baby to suck about 2 3 minutes in order to start the flow of breast milk. Latching Teaching your baby how to latch on to your breast properly is very important. An improper latch can cause nipple pain and decreased milk supply for you and poor weight gain in your baby. Also, if your baby is not latched onto your nipple properly, he or she may swallow some air during feeding. This can make your baby fussy. Burping your baby when you switch breasts during the feeding can help to get rid of the air. However, teaching your baby to latch on properly is still the best way to prevent fussiness from swallowing air while breastfeeding. Signs that your baby has successfully latched on to your nipple:    Silent tugging or silent sucking, without causing you pain.   Swallowing heard  between every 3 4 sucks.    Muscle movement above and in front of his or her ears while sucking.  Signs that your baby has not successfully latched on to nipple:   Sucking sounds or smacking sounds from your baby while breastfeeding.  Nipple pain. If you think your baby has not latched on correctly, slip your finger into the corner of your baby's mouth to break the suction and place it between your baby's gums. Attempt breastfeeding initiation again. Signs of Successful Breastfeeding Signs from your baby:   A gradual decrease in the number of sucks or complete cessation of sucking.   Falling asleep.   Relaxation of his or her body.   Retention of a small amount of milk in his or her mouth.   Letting go of your breast by himself or herself. Signs from you:  Breasts that have increased in firmness, weight, and size 1 3 hours after feeding.   Breasts that are softer immediately after breastfeeding.  Increased milk volume, as well as a change in milk consistency and color by the 5th day of breastfeeding.   Nipples that are not sore, cracked, or bleeding. Signs That Your Baby is Getting Enough Milk  Wetting at least 3 diapers in a 24-hour period. The urine should be clear and pale yellow by age 5 days.  At least 3 stools in a 24-hour period by age 5 days. The stool should be soft and yellow.  At least 3 stools in a 24-hour period by age 7 days. The stool should be seedy and yellow.  No loss of weight greater than 10% of birth weight during the first 3 days of age.  Average weight gain of 4 7 ounces (120 210 mL) per week after age 4 days.  Consistent daily weight gain by age 5 days, without weight loss after the age of 2 weeks. After a feeding, your baby may spit up a small amount. This is common. BREASTFEEDING FREQUENCY AND DURATION Frequent feeding will help you make more milk and can prevent sore nipples and breast engorgement.   Breastfeed when you feel the need to  reduce the fullness of your breasts or when your baby shows signs of hunger. This is called "breastfeeding on demand." Avoid introducing a pacifier to your baby while you are working to establish breastfeeding (the first 4 6 weeks after your baby is born). After this time you may choose to use a pacifier. Research has shown that pacifier use during the first year of a baby's life decreases the risk of sudden infant death syndrome (SIDS). Allow your baby to feed on each breast as long as he or she wants. Breastfeed until your baby is finished feeding. When your baby unlatches or falls asleep while feeding from the first breast, offer the second breast. Because newborns are often sleepy in the first few weeks of life, you may need to awaken your baby to get him or her to feed. Breastfeeding times will vary from baby to baby. However, the following rules can serve as a guide to help you ensure that your baby is properly fed:  Newborns (babies 4 weeks of age or younger) may breastfeed every 1 3 hours.  Newborns should not go longer than 3 hours during the day or 5 hours during the night without breastfeeding.  You should breastfeed your baby a minimum of 8 times in a 24-hour period until you begin to introduce solid foods to your baby at around 6 months of age. BREAST MILK PUMPING Pumping and storing breast milk allows you to ensure that your baby is exclusively fed your breast milk, even at times when you are unable to breastfeed. This is especially important if you are going back to work while you are still breastfeeding or when you are not able to be present during feedings. Your lactation consultant can give you guidelines on how long it is safe to store breast milk.  A breast pump is a machine that allows you to pump milk from your breast into a sterile bottle. The pumped breast milk can then be stored in a refrigerator or freezer. Some breast pumps are operated by hand, while others use electricity. Ask  your lactation consultant which type will work best for you. Breast pumps can be purchased, but some hospitals and breastfeeding support groups lease breast pumps on a monthly basis. A lactation consultant can teach you how to hand express breast milk, if you prefer not to use a pump.  CARING FOR YOUR BREASTS WHILE YOU BREASTFEED Nipples can become dry, cracked, and sore while breastfeeding. The following recommendations can help keep your breasts moisturized and healthy:  Avoid using soap on your nipples.   Wear a supportive bra. Although not required, special nursing bras and tank tops are designed to allow access to your breasts for breastfeeding without taking off your entire bra or top. Avoid wearing underwire style bras or extremely tight bras.  Air dry your nipples for 3 4minutes after each feeding.   Use only cotton bra pads to absorb leaked breast milk. Leaking of breast milk between feedings is normal.   Use lanolin on your nipples after breastfeeding. Lanolin helps to maintain your skin's normal moisture barrier. If you use pure lanolin you do not need to wash it off before feeding your baby again. Pure lanolin is not toxic to your baby. You may also hand express a few drops of breast milk and gently massage that milk into your nipples and allow the milk to air dry. In the first few weeks after giving birth, some women   experience extremely full breasts (engorgement). Engorgement can make your breasts feel heavy, warm, and tender to the touch. Engorgement peaks within 3 5 days after you give birth. The following recommendations can help ease engorgement:  Completely empty your breasts while breastfeeding or pumping. You may want to start by applying warm, moist heat (in the shower or with warm water-soaked hand towels) just before feeding or pumping. This increases circulation and helps the milk flow. If your baby does not completely empty your breasts while breastfeeding, pump any extra  milk after he or she is finished.  Wear a snug bra (nursing or regular) or tank top for 1 2 days to signal your body to slightly decrease milk production.  Apply ice packs to your breasts, unless this is too uncomfortable for you.  Make sure that your baby is latched on and positioned properly while breastfeeding. If engorgement persists after 48 hours of following these recommendations, contact your health care provider or a lactation consultant. OVERALL HEALTH CARE RECOMMENDATIONS WHILE BREASTFEEDING  Eat healthy foods. Alternate between meals and snacks, eating 3 of each per day. Because what you eat affects your breast milk, some of the foods may make your baby more irritable than usual. Avoid eating these foods if you are sure that they are negatively affecting your baby.  Drink milk, fruit juice, and water to satisfy your thirst (about 10 glasses a day).   Rest often, relax, and continue to take your prenatal vitamins to prevent fatigue, stress, and anemia.  Continue breast self-awareness checks.  Avoid chewing and smoking tobacco.  Avoid alcohol and drug use. Some medicines that may be harmful to your baby can pass through breast milk. It is important to ask your health care provider before taking any medicine, including all over-the-counter and prescription medicine as well as vitamin and herbal supplements. It is possible to become pregnant while breastfeeding. If birth control is desired, ask your health care provider about options that will be safe for your baby. SEEK MEDICAL CARE IF:   You feel like you want to stop breastfeeding or have become frustrated with breastfeeding.  You have painful breasts or nipples.  Your nipples are cracked or bleeding.  Your breasts are red, tender, or warm.  You have a swollen area on either breast.  You have a fever or chills.  You have nausea or vomiting.  You have drainage other than breast milk from your nipples.  Your breasts  do not become full before feedings by the 5th day after you give birth.  You feel sad and depressed.  Your baby is too sleepy to eat well.  Your baby is having trouble sleeping.   Your baby is wetting less than 3 diapers in a 24-hour period.  Your baby has less than 3 stools in a 24-hour period.  Your baby's skin or the white part of his or her eyes becomes yellow.   Your baby is not gaining weight by 5 days of age. SEEK IMMEDIATE MEDICAL CARE IF:   Your baby is overly tired (lethargic) and does not want to wake up and feed.  Your baby develops an unexplained fever. Document Released: 08/22/2005 Document Revised: 04/24/2013 Document Reviewed: 02/13/2013 [employer] Patient Information 2014 [employer].  

## 2013-09-10 NOTE — Progress Notes (Signed)
P= 86  

## 2013-09-10 NOTE — Progress Notes (Signed)
Cultures today Doing well

## 2013-09-11 LAB — GC/CHLAMYDIA PROBE AMP
CT PROBE, AMP APTIMA: NEGATIVE
GC Probe RNA: NEGATIVE

## 2013-09-12 LAB — CULTURE, BETA STREP (GROUP B ONLY)

## 2013-09-13 ENCOUNTER — Encounter: Payer: Self-pay | Admitting: Family Medicine

## 2013-09-17 ENCOUNTER — Ambulatory Visit (INDEPENDENT_AMBULATORY_CARE_PROVIDER_SITE_OTHER): Payer: Medicaid Other | Admitting: Obstetrics & Gynecology

## 2013-09-17 VITALS — BP 127/72 | Wt 160.0 lb

## 2013-09-17 DIAGNOSIS — Z3480 Encounter for supervision of other normal pregnancy, unspecified trimester: Secondary | ICD-10-CM

## 2013-09-17 NOTE — Progress Notes (Signed)
GBS neg.  No other complaints or concerns.  Fetal movement and labor precautions reviewed.

## 2013-09-17 NOTE — Progress Notes (Signed)
P=79 

## 2013-09-17 NOTE — Patient Instructions (Signed)
Return to clinic for any obstetric concerns or go to [facility] for evaluation  

## 2013-09-24 ENCOUNTER — Encounter: Payer: Self-pay | Admitting: Obstetrics & Gynecology

## 2013-09-24 ENCOUNTER — Ambulatory Visit (INDEPENDENT_AMBULATORY_CARE_PROVIDER_SITE_OTHER): Payer: Medicaid Other | Admitting: Obstetrics & Gynecology

## 2013-09-24 VITALS — BP 110/65 | Wt 161.0 lb

## 2013-09-24 DIAGNOSIS — Z3480 Encounter for supervision of other normal pregnancy, unspecified trimester: Secondary | ICD-10-CM

## 2013-09-24 NOTE — Progress Notes (Signed)
Routine visit. Good FM. Labor precautions reviewed. She describes her pelvic pressure as unchanged from last month.

## 2013-09-24 NOTE — Progress Notes (Signed)
P-72

## 2013-09-26 ENCOUNTER — Encounter (HOSPITAL_COMMUNITY): Payer: Self-pay | Admitting: General Practice

## 2013-09-26 ENCOUNTER — Inpatient Hospital Stay (HOSPITAL_COMMUNITY)
Admission: AD | Admit: 2013-09-26 | Discharge: 2013-09-26 | Disposition: A | Discharge status: Home / Self Care | Payer: Medicaid Other | Source: Ambulatory Visit | Attending: Obstetrics & Gynecology | Admitting: Obstetrics & Gynecology

## 2013-09-26 DIAGNOSIS — O479 False labor, unspecified: Secondary | ICD-10-CM | POA: Insufficient documentation

## 2013-09-26 NOTE — [facility] Note (Signed)
Pt in for routine labor check.

## 2013-09-26 NOTE — Discharge Instructions (Signed)

## 2013-10-01 ENCOUNTER — Encounter: Payer: Self-pay | Admitting: Obstetrics and Gynecology

## 2013-10-01 ENCOUNTER — Ambulatory Visit (INDEPENDENT_AMBULATORY_CARE_PROVIDER_SITE_OTHER): Payer: Medicaid Other | Admitting: Obstetrics and Gynecology

## 2013-10-01 VITALS — BP 110/71 | Wt 161.0 lb

## 2013-10-01 DIAGNOSIS — F17200 Nicotine dependence, unspecified, uncomplicated: Secondary | ICD-10-CM

## 2013-10-01 DIAGNOSIS — Z3480 Encounter for supervision of other normal pregnancy, unspecified trimester: Secondary | ICD-10-CM

## 2013-10-01 NOTE — Progress Notes (Signed)
P - 81 

## 2013-10-01 NOTE — Progress Notes (Signed)
Patient doing well with same pelvic pressure and occasional contractions. Cervical exam unchanged. Membrane stripped. Will start fetal testing next week if not delivered. FM/labor precautions reviewed

## 2013-10-02 ENCOUNTER — Inpatient Hospital Stay (HOSPITAL_COMMUNITY)
Admission: AD | Admit: 2013-10-02 | Discharge: 2013-10-03 | DRG: 775 | Disposition: A | Discharge status: Home / Self Care | Payer: Medicaid Other | Source: Ambulatory Visit | Attending: Obstetrics & Gynecology | Admitting: Obstetrics & Gynecology

## 2013-10-02 ENCOUNTER — Encounter (HOSPITAL_COMMUNITY): Payer: Medicaid Other | Admitting: Anesthesiology

## 2013-10-02 ENCOUNTER — Encounter (HOSPITAL_COMMUNITY): Payer: Self-pay | Admitting: *Deleted

## 2013-10-02 ENCOUNTER — Inpatient Hospital Stay (HOSPITAL_COMMUNITY): Payer: Medicaid Other | Admitting: Anesthesiology

## 2013-10-02 DIAGNOSIS — F411 Generalized anxiety disorder: Secondary | ICD-10-CM | POA: Diagnosis present

## 2013-10-02 DIAGNOSIS — O368190 Decreased fetal movements, unspecified trimester, not applicable or unspecified: Secondary | ICD-10-CM

## 2013-10-02 DIAGNOSIS — O99330 Smoking (tobacco) complicating pregnancy, unspecified trimester: Secondary | ICD-10-CM

## 2013-10-02 DIAGNOSIS — IMO0001 Reserved for inherently not codable concepts without codable children: Secondary | ICD-10-CM

## 2013-10-02 DIAGNOSIS — O99334 Smoking (tobacco) complicating childbirth: Secondary | ICD-10-CM | POA: Diagnosis present

## 2013-10-02 DIAGNOSIS — O99344 Other mental disorders complicating childbirth: Secondary | ICD-10-CM

## 2013-10-02 DIAGNOSIS — Z3480 Encounter for supervision of other normal pregnancy, unspecified trimester: Secondary | ICD-10-CM

## 2013-10-02 LAB — TYPE AND SCREEN
ABO/RH(D): O POS
Antibody Screen: NEGATIVE

## 2013-10-02 LAB — RPR: RPR Ser Ql: NONREACTIVE

## 2013-10-02 LAB — ABO/RH: ABO/RH(D): O POS

## 2013-10-02 LAB — CBC
HCT: 33 % — ABNORMAL LOW (ref 36.0–46.0)
HEMOGLOBIN: 11.3 g/dL — AB (ref 12.0–15.0)
MCH: 29.5 pg (ref 26.0–34.0)
MCHC: 34.2 g/dL (ref 30.0–36.0)
MCV: 86.2 fL (ref 78.0–100.0)
Platelets: 367 10*3/uL (ref 150–400)
RBC: 3.83 MIL/uL — ABNORMAL LOW (ref 3.87–5.11)
RDW: 13.5 % (ref 11.5–15.5)
WBC: 15.3 10*3/uL — AB (ref 4.0–10.5)

## 2013-10-02 LAB — RUBELLA SCREEN: RUBELLA: 1.03 {index} — AB (ref <0.90)

## 2013-10-02 LAB — HEPATITIS B SURFACE ANTIGEN: Hepatitis B Surface Ag: NEGATIVE

## 2013-10-02 MED ORDER — LANOLIN HYDROUS EX OINT: TOPICAL_OINTMENT | CUTANEOUS | Status: DC | PRN

## 2013-10-02 MED ORDER — ONDANSETRON HCL 4 MG/2ML IJ SOLN: 4.0000 mg | INTRAMUSCULAR | Status: DC | PRN

## 2013-10-02 MED ORDER — PHENYLEPHRINE 40 MCG/ML (10ML) SYRINGE FOR IV PUSH (FOR BLOOD PRESSURE SUPPORT)
80.0000 ug | PREFILLED_SYRINGE | INTRAVENOUS | Status: DC | PRN
  Filled 2013-10-02: qty 10
  Filled 2013-10-02: qty 2

## 2013-10-02 MED ORDER — ACETAMINOPHEN 325 MG PO TABS: 650.0000 mg | ORAL_TABLET | ORAL | Status: DC | PRN

## 2013-10-02 MED ORDER — BENZOCAINE-MENTHOL 20-0.5 % EX AERO
1.0000 "application " | INHALATION_SPRAY | CUTANEOUS | Status: DC | PRN
  Administered 2013-10-02: 1 via TOPICAL
  Filled 2013-10-02: qty 56

## 2013-10-02 MED ORDER — OXYCODONE-ACETAMINOPHEN 5-325 MG PO TABS
1.0000 | ORAL_TABLET | ORAL | Status: DC | PRN
  Administered 2013-10-02: 1 via ORAL
  Administered 2013-10-03 (×3): 2 via ORAL
  Filled 2013-10-02 (×2): qty 1
  Filled 2013-10-02 (×2): qty 2
  Filled 2013-10-02: qty 1

## 2013-10-02 MED ORDER — WITCH HAZEL-GLYCERIN EX PADS: 1.0000 "application " | MEDICATED_PAD | CUTANEOUS | Status: DC | PRN

## 2013-10-02 MED ORDER — LIDOCAINE HCL (PF) 1 % IJ SOLN
INTRAMUSCULAR | Status: DC | PRN
  Administered 2013-10-02 (×2): 9 mL

## 2013-10-02 MED ORDER — LACTATED RINGERS IV SOLN
500.0000 mL | Freq: Once | INTRAVENOUS | Status: AC
Start: 2013-10-02 — End: 2013-10-02
  Administered 2013-10-02: 500 mL via INTRAVENOUS

## 2013-10-02 MED ORDER — FENTANYL 2.5 MCG/ML BUPIVACAINE 1/10 % EPIDURAL INFUSION (WH - ANES)
14.0000 mL/h | INTRAMUSCULAR | Status: DC | PRN
  Filled 2013-10-02: qty 125

## 2013-10-02 MED ORDER — OXYCODONE-ACETAMINOPHEN 5-325 MG PO TABS: 1.0000 | ORAL_TABLET | ORAL | Status: DC | PRN

## 2013-10-02 MED ORDER — DIPHENHYDRAMINE HCL 25 MG PO CAPS: 25.0000 mg | ORAL_CAPSULE | Freq: Four times a day (QID) | ORAL | Status: DC | PRN

## 2013-10-02 MED ORDER — ZOLPIDEM TARTRATE 5 MG PO TABS: 5.0000 mg | ORAL_TABLET | Freq: Every evening | ORAL | Status: DC | PRN

## 2013-10-02 MED ORDER — OXYCODONE-ACETAMINOPHEN 5-325 MG PO TABS
1.0000 | ORAL_TABLET | ORAL | Status: DC | PRN
  Administered 2013-10-02: 1 via ORAL
  Filled 2013-10-02: qty 1

## 2013-10-02 MED ORDER — PHENYLEPHRINE 40 MCG/ML (10ML) SYRINGE FOR IV PUSH (FOR BLOOD PRESSURE SUPPORT)
80.0000 ug | PREFILLED_SYRINGE | INTRAVENOUS | Status: DC | PRN
  Filled 2013-10-02: qty 2

## 2013-10-02 MED ORDER — TETANUS-DIPHTH-ACELL PERTUSSIS 5-2.5-18.5 LF-MCG/0.5 IM SUSP: 0.5000 mL | Freq: Once | INTRAMUSCULAR | Status: DC

## 2013-10-02 MED ORDER — DIPHENHYDRAMINE HCL 50 MG/ML IJ SOLN: 12.5000 mg | INTRAMUSCULAR | Status: DC | PRN

## 2013-10-02 MED ORDER — EPHEDRINE 5 MG/ML INJ
10.0000 mg | INTRAVENOUS | Status: DC | PRN
  Filled 2013-10-02: qty 2

## 2013-10-02 MED ORDER — ONDANSETRON HCL 4 MG/2ML IJ SOLN: 4.0000 mg | Freq: Four times a day (QID) | INTRAMUSCULAR | Status: DC | PRN

## 2013-10-02 MED ORDER — LACTATED RINGERS IV SOLN
INTRAVENOUS | Status: DC
  Administered 2013-10-02 (×2): via INTRAVENOUS

## 2013-10-02 MED ORDER — FENTANYL 2.5 MCG/ML BUPIVACAINE 1/10 % EPIDURAL INFUSION (WH - ANES)
INTRAMUSCULAR | Status: DC | PRN
  Administered 2013-10-02: 14 mL/h via EPIDURAL

## 2013-10-02 MED ORDER — DIBUCAINE 1 % RE OINT: 1.0000 "application " | TOPICAL_OINTMENT | RECTAL | Status: DC | PRN

## 2013-10-02 MED ORDER — IBUPROFEN 600 MG PO TABS
600.0000 mg | ORAL_TABLET | Freq: Four times a day (QID) | ORAL | Status: DC | PRN
  Administered 2013-10-02: 600 mg via ORAL
  Filled 2013-10-02: qty 1

## 2013-10-02 MED ORDER — LACTATED RINGERS IV SOLN: 500.0000 mL | INTRAVENOUS | Status: DC | PRN

## 2013-10-02 MED ORDER — CITRIC ACID-SODIUM CITRATE 334-500 MG/5ML PO SOLN
30.0000 mL | ORAL | Status: DC | PRN
  Administered 2013-10-02: 30 mL via ORAL
  Filled 2013-10-02: qty 15

## 2013-10-02 MED ORDER — SENNOSIDES-DOCUSATE SODIUM 8.6-50 MG PO TABS
2.0000 | ORAL_TABLET | ORAL | Status: DC
  Administered 2013-10-03: 2 via ORAL
  Filled 2013-10-02: qty 2

## 2013-10-02 MED ORDER — TETANUS-DIPHTH-ACELL PERTUSSIS 5-2.5-18.5 LF-MCG/0.5 IM SUSP
0.5000 mL | Freq: Once | INTRAMUSCULAR | Status: DC
Start: 2013-10-03 — End: 2013-10-03

## 2013-10-02 MED ORDER — SIMETHICONE 80 MG PO CHEW: 80.0000 mg | CHEWABLE_TABLET | ORAL | Status: DC | PRN

## 2013-10-02 MED ORDER — PRENATAL MULTIVITAMIN CH
1.0000 | ORAL_TABLET | Freq: Every day | ORAL | Status: DC
  Administered 2013-10-03: 1 via ORAL
  Filled 2013-10-02: qty 1

## 2013-10-02 MED ORDER — OXYTOCIN BOLUS FROM INFUSION: 500.0000 mL | INTRAVENOUS | Status: DC

## 2013-10-02 MED ORDER — OXYTOCIN 40 UNITS IN LACTATED RINGERS INFUSION - SIMPLE MED
62.5000 mL/h | INTRAVENOUS | Status: DC
  Filled 2013-10-02: qty 1000

## 2013-10-02 MED ORDER — BENZOCAINE-MENTHOL 20-0.5 % EX AERO: 1.0000 "application " | INHALATION_SPRAY | CUTANEOUS | Status: DC | PRN

## 2013-10-02 MED ORDER — IBUPROFEN 600 MG PO TABS
600.0000 mg | ORAL_TABLET | Freq: Four times a day (QID) | ORAL | Status: DC
  Administered 2013-10-02: 600 mg via ORAL
  Filled 2013-10-02: qty 1

## 2013-10-02 MED ORDER — EPHEDRINE 5 MG/ML INJ
10.0000 mg | INTRAVENOUS | Status: DC | PRN
  Filled 2013-10-02: qty 2
  Filled 2013-10-02: qty 4

## 2013-10-02 MED ORDER — IBUPROFEN 600 MG PO TABS
600.0000 mg | ORAL_TABLET | Freq: Four times a day (QID) | ORAL | Status: DC
  Administered 2013-10-02 – 2013-10-03 (×4): 600 mg via ORAL
  Filled 2013-10-02 (×4): qty 1

## 2013-10-02 MED ORDER — DIBUCAINE 1 % RE OINT
1.0000 | TOPICAL_OINTMENT | RECTAL | Status: DC | PRN
Start: 2013-10-02 — End: 2013-10-03

## 2013-10-02 MED ORDER — ONDANSETRON HCL 4 MG PO TABS: 4.0000 mg | ORAL_TABLET | ORAL | Status: DC | PRN

## 2013-10-02 MED ORDER — PRENATAL MULTIVITAMIN CH
1.0000 | ORAL_TABLET | Freq: Every day | ORAL | Status: DC
  Administered 2013-10-02: 1 via ORAL
  Filled 2013-10-02: qty 1

## 2013-10-02 MED ORDER — LIDOCAINE HCL (PF) 1 % IJ SOLN
30.0000 mL | INTRAMUSCULAR | Status: DC | PRN
  Filled 2013-10-02 (×2): qty 30

## 2013-10-02 MED ORDER — SENNOSIDES-DOCUSATE SODIUM 8.6-50 MG PO TABS: 2.0000 | ORAL_TABLET | ORAL | Status: DC

## 2013-10-02 NOTE — Plan of Care (Signed)
Problem: Consults Goal: Birthing Suites Patient Information Press F2 to bring up selections list Outcome: Completed/Met Date Met:  10/02/13  Pt 37-[redacted] weeks EGA

## 2013-10-02 NOTE — Anesthesia Preprocedure Evaluation (Signed)
Anesthesia Evaluation  Patient identified by MRN, date of birth, ID band Patient awake    Reviewed: Allergy & Precautions, H&P , NPO status , [name] Chart, lab work & pertinent test results  Airway Mallampati: II TM Distance: >3 FB Neck ROM: full    Dental no notable dental hx.    Pulmonary Current Smoker,    Pulmonary exam normal       Cardiovascular negative cardio ROS      Neuro/Psych    GI/Hepatic negative GI ROS, Neg liver ROS,   Endo/Other  negative endocrine ROS  Renal/GU negative Renal ROS     Musculoskeletal   Abdominal Normal abdominal exam  (+)   Peds  Hematology negative hematology ROS (+)   Anesthesia Other Findings   Reproductive/Obstetrics (+) Pregnancy                           Anesthesia Physical Anesthesia Plan  ASA: II  Anesthesia Plan: Epidural   Post-op Pain Management:    Induction:   Airway Management Planned:   Additional Equipment:   Intra-op Plan:   Post-operative Plan:   Informed Consent: I have reviewed the patients History and Physical, chart, labs and discussed the procedure including the risks, benefits and alternatives for the proposed anesthesia with the patient or authorized representative who has indicated his/her understanding and acceptance.     Plan Discussed with:   Anesthesia Plan Comments:         Anesthesia Quick Evaluation  

## 2013-10-02 NOTE — [facility] Note (Signed)
PT SAYS SHE HAS BEN HURTING  ALL DAY -   GETS [facility] AT [place]  [address]  TODAY-  5 CM- HAD MEMBRANES STRIPPED TODAY  DENIES HSV AND MRSA.   GBS - NEG.

## 2013-10-02 NOTE — Progress Notes (Signed)
Heather CousinsCara [name] is a 33 y.o. [id number] at 5320w5d admitted for active labor  Subjective: Worsening pain but contractions still irregular. Would like epidural. Reports fast deliveries with prior babies.  Objective: BP 126/79  Pulse 91  Temp(Src) 97.9 F (36.6 C) (Oral)  Resp 18  Ht 5\' 3"  (1.6 m)  Wt 73.029 kg (161 lb)  BMI 28.53 kg/m2      FHT:  FHR: 125 bpm, variability: moderate,  accelerations:  Present,  decelerations:  Absent UC:   irregular, every 4-7 minutes SVE:   Dilation: 6 Effacement (%): 70 Station: -1 Exam by:: DCALLAWAY, RN  Labs: Lab Results  Component Value Date   WBC 15.3* 10/02/2013   HGB 11.3* 10/02/2013   HCT 33.0* 10/02/2013   MCV 86.2 10/02/2013   PLT 367 10/02/2013    Assessment / Plan: Slow progression likely secondary to infrequent contractions.  Labor: No further dilation since last check. AROM at 4am, recheck and augment with pitocin if needed in 2 hours Preeclampsia:  no signs or symptoms of toxicity Fetal Wellbeing:  Category I Pain Control:  Epidural to be placed now I/D:  n/a Anticipated MOD:  NSVD  Beverely Lowdamo, [name] 10/02/2013, 4:22 AM  I have seen and examined this patient and agree with above documentation in the resident's note. AROM performed with return of moderate amount of clear fluid.  Rulon AbideKeli Trinisha Paget, M.D. [facility] 10/02/2013 4:43 AM

## 2013-10-02 NOTE — Progress Notes (Signed)
Patient was referred for history of depression/anxiety. * Referral screened out by Clinical Social Worker because none of the following criteria appear to apply: ~ History of anxiety/depression during this pregnancy, or of post-partum depression. ~ Diagnosis of anxiety and/or depression within last 3 years ~ History of depression due to pregnancy loss/loss of child OR * [name] symptoms currently being treated with medication and/or therapy. Please contact the Clinical Social Worker if needs arise, or if patient requests.  MOB's PNR states dx was 2009.  MOB on Klonopin prior to pregnancy.   

## 2013-10-02 NOTE — Anesthesia Procedure Notes (Signed)
Epidural Patient location during procedure: OB Start time: 10/02/2013 4:42 AM End time: 10/02/2013 4:46 AM  Staffing Anesthesiologist: Leilani AbleHATCHETT, Alwin Lanigan Performed by: anesthesiologist   Preanesthetic Checklist Completed: patient identified, surgical consent, pre-op evaluation, timeout performed, IV checked, risks and benefits discussed and monitors and equipment checked  Epidural Patient position: sitting Prep: site prepped and draped and DuraPrep Patient monitoring: continuous pulse ox and blood pressure Approach: midline Injection technique: LOR air  Needle:  Needle type: Tuohy  Needle gauge: 17 G Needle length: 9 cm and 9 Needle insertion depth: 5 cm cm Catheter type: closed end flexible Catheter size: 19 Gauge Catheter at skin depth: 10 cm Test dose: negative and Other  Assessment Sensory level: T9 Events: blood not aspirated, injection not painful, no injection resistance, negative IV test and no paresthesia  Additional Notes Reason for block:procedure for pain

## 2013-10-02 NOTE — Anesthesia Postprocedure Evaluation (Signed)
  Anesthesia Post-op Note  Patient: Heather CousinsCara [name]  Procedure(s) Performed: * No procedures listed *  Patient Location: Mother/Baby  Anesthesia Type:Epidural  Level of Consciousness: awake  Airway and Oxygen Therapy: Patient Spontanous Breathing  Post-op Pain: mild  Post-op Assessment: [name] Cardiovascular Status Stable and Respiratory Function Stable  Post-op Vital Signs: stable  Complications: No apparent anesthesia complications

## 2013-10-02 NOTE — H&P (Signed)
  Heather CousinsCara [name] is a 33 y.o. female G3P2002 at 1240w5d who presented with progressively worsening uterine contractions. She is being admitted for active labor Maternal Medical History:  Reason for admission: Nausea.  Prenatal complications: no prenatal complications   Pt has been having contractions for a few days now, she reports that at about midnight she noticed an increase in the frequency and severity of her contractions. They are regular contractions apprx every 6-8 mins, lasting 60 secs. She also passed bloody show at 6pm yesterday and complains of pelvic pressure. Denies vaginal bleeding, + decreased fetal mov't OB History   Grav Para Term Preterm Abortions TAB SAB Ect Mult Living   3 2 2       2      Past Medical History  Diagnosis Date  . Anxiety     stopped meds with pregnancy   Past Surgical History  Procedure Laterality Date  . Hernia repair      when 6 wks old  . Wisdom tooth extraction     Family History: family history is not on file. Social History:  reports that she has been smoking Cigarettes.  She has been smoking about 1.00 pack per day. She has never used smokeless tobacco. She reports that she does not drink alcohol or use illicit drugs.   Prenatal Transfer Tool  Maternal Diabetes: No Genetic Screening: Normal Maternal Ultrasounds/Referrals: Normal Fetal Ultrasounds or other Referrals:  None Maternal Substance Abuse:  Yes:  Type: Smoker Significant Maternal Medications:  None Significant Maternal Lab Results:  None Other Comments:  None  Review of Systems  Constitutional: Negative for fever.  HENT: Negative.   Eyes: Negative.   Respiratory: Negative.   Cardiovascular: Negative.   Gastrointestinal: Positive for abdominal pain. Negative for nausea, vomiting, diarrhea and constipation.  Genitourinary: Negative.     Dilation: 6 Effacement (%): 70 Station: -1 Exam by:: DCALLAWAY, RN Blood pressure 126/79, pulse 91, temperature 97.9 F (36.6 C),  temperature source Oral, resp. rate 18, height 5\' 3"  (1.6 m), weight 73.029 kg (161 lb). Maternal Exam:  Uterine Assessment: Contraction strength is moderate.  Contraction duration is 1 minute. Contraction frequency is regular.   Abdomen: Patient reports no abdominal tenderness.   Physical Exam  Constitutional: She appears well-developed and well-nourished.  Cardiovascular: Normal heart sounds.   Respiratory: Breath sounds normal.    Prenatal labs: ABO, Rh:   Antibody:   Rubella:   RPR: NON REAC (12/19 1120)  HBsAg:    HIV: NON REACTIVE (12/19 1120)  GBS: Negative (01/06 0000)   Assessment/Plan: Heather CousinsCara [name] is a 33 y.o. [id number] at 7240w5d here for uterine contractions #Labor: Active, progressing normally #Pain: Epidural #FWB: Category I #MOF: Breast + Bottle #MOC:OCP's #Circ:  Yes, plans outpatient :Sallyanne HaversMuazu, Aisha 10/02/2013, 2:56 AM    I have seen and examined this patient and agree with above documentation in the resident's note.  EFW 6.5#  Rulon AbideKeli Bach Rocchi, M.D. [facility] Fellow 10/02/2013 4:24 AM

## 2013-10-03 MED ORDER — PRENATAL MULTIVITAMIN CH: 1.0000 | ORAL_TABLET | Freq: Every day | ORAL | Status: DC

## 2013-10-03 MED ORDER — IBUPROFEN 600 MG PO TABS: 600.0000 mg | ORAL_TABLET | Freq: Four times a day (QID) | ORAL | Status: DC

## 2013-10-03 MED ORDER — NORETHINDRONE 0.35 MG PO TABS
1.0000 | ORAL_TABLET | Freq: Every day | ORAL | Status: DC
Start: 2013-10-03 — End: 2014-10-06

## 2013-10-03 NOTE — Discharge Summary (Signed)
Obstetric Discharge Summary Reason for Admission: onset of labor Prenatal Procedures: none Intrapartum Procedures: spontaneous vaginal delivery Postpartum Procedures: none Complications-Operative and Postpartum: vaginal laceration Hemoglobin  Date Value Range Status  10/02/2013 11.3* 12.0 - 15.0 g/dL Final     HCT  Date Value Range Status  10/02/2013 33.0* 36.0 - 46.0 % Final    Physical Exam:  General: alert, cooperative and no distress Lochia: appropriate Uterine Fundus: firm Incision: na DVT Evaluation: No evidence of DVT seen on physical exam. No cords or calf tenderness. No significant calf/ankle edema.  Discharge Diagnoses: Term Pregnancy-delivered  Discharge Information: Date: 10/03/2013 Activity: pelvic rest Diet: routine Medications: PNV, Ibuprofen and micronor Condition: stable Instructions: refer to practice specific booklet Discharge to: home Contraception: Given script for progesterone only pills on discharge. Follow-up Information   [facility] Schedule an appointment as soon as possible for a visit in 4 weeks.   Specialty:  Obstetrics and Gynecology   Contact information:   [address] [place] [id number] [phone]      Newborn Data: Live born female  Birth Weight: 7 lb 8.1 oz (3405 g) APGAR: 9, 9  Home with mother. Breastfeeding.  Heather [name], Heather [name] 10/03/2013, 7:44 AM  I have seen and examined this patient and I agree with the above. Heather [name], [name] 8:59 AM 10/03/2013

## 2013-10-03 NOTE — Progress Notes (Signed)
UR chart review completed.  

## 2013-10-03 NOTE — Lactation Note (Signed)
This note was copied from the chart of Boy Lawanda CousinsCara Fields. Lactation Consultation Note Mom states she will be giving both breastmilk and formula; states she is concerned that she is not getting enough breast milk when she pumps. States she is concerned b/c she lives separate from FOB and can't pump enough for FOB to take with him, so he will be feeding formula. Mom [facility]. Discussed volume requirements, discussed the baby is more efficient at getting milk out than a pump, enc mom to continue latching directly as much as possible and to limit bottles of formula if possible.  Enc mom to call lactation office if she has any concerns, and to attend the [facility].   Patient Name: Boy Lawanda CousinsCara Fields ZOXWR'UToday's Date: 10/03/2013     Maternal Data    Feeding    LATCH Score/Interventions                      Lactation Tools Discussed/Used     Consult Status      Lenard ForthSanders, Isis Costanza Fulmer 10/03/2013, 12:24 PM

## 2013-10-03 NOTE — Discharge Instructions (Signed)

## 2013-10-07 ENCOUNTER — Encounter: Payer: Self-pay | Admitting: Obstetrics and Gynecology

## 2013-10-07 ENCOUNTER — Ambulatory Visit (INDEPENDENT_AMBULATORY_CARE_PROVIDER_SITE_OTHER): Payer: Medicaid Other | Admitting: Obstetrics and Gynecology

## 2013-10-07 VITALS — BP 123/74 | HR 54 | Ht 62.0 in | Wt 154.0 lb

## 2013-10-07 DIAGNOSIS — O9089 Other complications of the puerperium, not elsewhere classified: Secondary | ICD-10-CM

## 2013-10-07 DIAGNOSIS — R102 Pelvic and perineal pain: Principal | ICD-10-CM

## 2013-10-07 DIAGNOSIS — N899 Noninflammatory disorder of vagina, unspecified: Secondary | ICD-10-CM

## 2013-10-07 MED ORDER — HYDROCHLOROTHIAZIDE 25 MG PO TABS: 25.0000 mg | ORAL_TABLET | Freq: Every day | ORAL | Status: DC

## 2013-10-07 NOTE — Progress Notes (Signed)
Patient ID: Heather [name], female   DOB: [birth year], 33 y.o.   MRN: [id number] 33 yo s/p vaginal delivery on 1/28 presenting today with vaginal pain. Patient had a first degree laceration repaired. Patient presents today reporting vaginal pain and vulva "not looking right". Patient also complaining of bilateral lower extremity edema.  GENERAL: Well-developed, well-nourished female in no acute distress.  PELVIC: Normal external female genitalia. No visible suture. First degree laceration not re approximated with some vaginal mucosa extending past the introitus.  EXTREMITIES: No cyanosis, clubbing. 2+ pitting edema up to ankles, 2+ distal pulses.   A/P 33 yo with vaginal pain s/p vaginal delivery - After injection of 1 cc lidocaine, first degree laceration repaired using 3.0 Vicryl with excellent hemostasis and restoration of the anatomy. Patient tolerated the procedure well - Rx HCTZ given - RTC for postpartum visit

## 2013-10-07 NOTE — Progress Notes (Signed)
Pain in vagina where stitches are and increased swelling in feet and ankles.

## 2013-10-08 ENCOUNTER — Encounter: Payer: Medicaid Other | Admitting: Obstetrics & Gynecology

## 2013-10-21 ENCOUNTER — Ambulatory Visit (INDEPENDENT_AMBULATORY_CARE_PROVIDER_SITE_OTHER): Payer: Medicaid Other | Admitting: Obstetrics & Gynecology

## 2013-10-21 ENCOUNTER — Encounter: Payer: Self-pay | Admitting: Obstetrics & Gynecology

## 2013-10-21 VITALS — BP 121/68 | HR 74 | Ht 64.0 in | Wt 148.0 lb

## 2013-10-21 DIAGNOSIS — F53 Postpartum depression: Secondary | ICD-10-CM

## 2013-10-21 DIAGNOSIS — O99345 Other mental disorders complicating the puerperium: Secondary | ICD-10-CM

## 2013-10-21 DIAGNOSIS — F329 Major depressive disorder, single episode, unspecified: Secondary | ICD-10-CM

## 2013-10-21 DIAGNOSIS — F341 Dysthymic disorder: Secondary | ICD-10-CM

## 2013-10-21 DIAGNOSIS — N898 Other specified noninflammatory disorders of vagina: Secondary | ICD-10-CM

## 2013-10-21 DIAGNOSIS — F3289 Other specified depressive episodes: Secondary | ICD-10-CM

## 2013-10-21 MED ORDER — CITALOPRAM HYDROBROMIDE 20 MG PO TABS: 20.0000 mg | ORAL_TABLET | Freq: Every day | ORAL | Status: DC

## 2013-10-21 MED ORDER — NORETHIN ACE-ETH ESTRAD-FE 1-20 MG-MCG(24) PO TABS: 1.0000 | ORAL_TABLET | Freq: Every day | ORAL | Status: DC

## 2013-10-21 NOTE — Addendum Note (Signed)
Addended by: Barbara CowerNOGUES, Constance Whittle L on: 10/21/2013 04:10 PM   Modules accepted: Orders

## 2013-10-21 NOTE — Progress Notes (Signed)
Patient is here today because she is increasingly sad and teary.  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   She has taken Lexapro in the past which they had to take her off of due to headache side effects and switched her to klonipin for anxiety which she quit taking once she became pregnant this time.  She has two other children and has not ever suffered from post partum depression.

## 2013-10-21 NOTE — Progress Notes (Signed)
   Subjective:    Patient ID: Heather [name], female    DOB: 1980-12-26, 33 y.o.   MRN: [id number]  HPI  32 separated W P3 here 3 weeks pp s/p NSVD for the complaint of PP depression. She has crying spells. She denies HI or SI. Feels very anxious and was treated for anxiety in the past. Requesting Klonapin. Says that Lexapro caused headaches. She is no longer breastfeeding and would like to return to her previous OCP ([facility]). She is aware that birth control pills/patch/ring can worsen depression. She declines Mirena, condoms, or diaphragm. She reports normal bowel function. Says that she is only voiding minimally.  Review of Systems     Objective:   Physical Exam        Assessment & Plan:

## 2013-10-21 NOTE — Patient Instructions (Signed)

## 2013-10-24 LAB — WET PREP, GENITAL
CLUE CELLS WET PREP: NONE SEEN
TRICH WET PREP: NONE SEEN
Yeast Wet Prep HPF POC: NONE SEEN

## 2013-10-30 NOTE — Progress Notes (Signed)
   Subjective:    Patient ID: Heather [name], female    DOB: 1980/11/03, 33 y.o.   MRN: [id number]  HPI  She feels like she is depressed but is requesting antianxiety meds. She denies HI and SI.   Review of Systems     Objective:   Physical Exam        Assessment & Plan:  I strongly encouraged to see a psychiatrist, but she adamently refuses to do this. I have told her that I will not prescribe addictive antianxiety meds. I did offer to prescribe a SSRI which she did agree to try.

## 2013-11-06 ENCOUNTER — Ambulatory Visit: Payer: Medicaid Other | Admitting: Obstetrics & Gynecology

## 2013-11-14 ENCOUNTER — Ambulatory Visit: Payer: Medicaid Other | Admitting: Obstetrics and Gynecology

## 2014-06-19 ENCOUNTER — Other Ambulatory Visit (INDEPENDENT_AMBULATORY_CARE_PROVIDER_SITE_OTHER): Payer: Medicaid Other | Admitting: *Deleted

## 2014-06-19 DIAGNOSIS — R309 Painful micturition, unspecified: Secondary | ICD-10-CM

## 2014-06-19 LAB — POCT URINALYSIS DIPSTICK
Bilirubin, UA: NEGATIVE
Ketones, UA: NEGATIVE
NITRITE UA: POSITIVE
PROTEIN UA: NEGATIVE
Urobilinogen, UA: NEGATIVE
pH, UA: 6

## 2014-06-19 MED ORDER — SULFAMETHOXAZOLE-TMP DS 800-160 MG PO TABS: 1.0000 | ORAL_TABLET | Freq: Two times a day (BID) | ORAL | Status: DC

## 2014-06-19 NOTE — Progress Notes (Signed)
Patient is having persistent increase in urination and foul smelling urine.  Urine dip is positive for leukocytes, blood and nitrates.  I have sent in a prescription for Bactrim and patient wishes to not have culture sent at this time as she does not currently have any insurance and would not be able to afford the bill.  She will call us back if her symptoms persist and come in to have the culture done.

## 2014-07-07 ENCOUNTER — Encounter: Payer: Self-pay | Admitting: Obstetrics & Gynecology

## 2014-09-05 NOTE — L&D Delivery Note (Signed)
Delivery Note At 1:30 AM a viable female was delivered via  (Presentation: cephalic, LOA).  APGAR: 9, 9; weight pending.   Placenta status: spontaneous, intact.  Cord: 3vc  with the following complications: none.  Cord pH: none  Anesthesia:  epidural Episiotomy:  none Lacerations:  none Est. Blood Loss (mL): 157   Mom to postpartum.  Baby to Couplet care / Skin to Skin.  Heather [name] 05/05/2015, 1:42 AM

## 2014-10-06 ENCOUNTER — Encounter: Payer: Self-pay | Admitting: Family Medicine

## 2014-10-06 ENCOUNTER — Ambulatory Visit (INDEPENDENT_AMBULATORY_CARE_PROVIDER_SITE_OTHER): Payer: Medicaid Other | Admitting: Family Medicine

## 2014-10-06 ENCOUNTER — Other Ambulatory Visit: Payer: Self-pay | Admitting: Family Medicine

## 2014-10-06 ENCOUNTER — Encounter: Payer: Self-pay | Admitting: *Deleted

## 2014-10-06 VITALS — BP 121/67 | HR 87 | Wt 139.0 lb

## 2014-10-06 DIAGNOSIS — Z23 Encounter for immunization: Secondary | ICD-10-CM

## 2014-10-06 DIAGNOSIS — Z113 Encounter for screening for infections with a predominantly sexual mode of transmission: Secondary | ICD-10-CM

## 2014-10-06 DIAGNOSIS — Z3682 Encounter for antenatal screening for nuchal translucency: Secondary | ICD-10-CM

## 2014-10-06 DIAGNOSIS — Z3481 Encounter for supervision of other normal pregnancy, first trimester: Secondary | ICD-10-CM

## 2014-10-06 DIAGNOSIS — Z124 Encounter for screening for malignant neoplasm of cervix: Secondary | ICD-10-CM | POA: Diagnosis not present

## 2014-10-06 DIAGNOSIS — Z1151 Encounter for screening for human papillomavirus (HPV): Secondary | ICD-10-CM | POA: Diagnosis not present

## 2014-10-06 MED ORDER — CONCEPT OB 130-92.4-1 MG PO CAPS: 1.0000 | ORAL_CAPSULE | Freq: Every day | ORAL | Status: DC

## 2014-10-06 MED ORDER — TETANUS-DIPHTH-ACELL PERTUSSIS 5-2.5-18.5 LF-MCG/0.5 IM SUSP: 0.5000 mL | Freq: Once | INTRAMUSCULAR | Status: DC

## 2014-10-06 NOTE — Progress Notes (Signed)
Ultrasound in office today measures 5278w2d fetus with positive heart rate.

## 2014-10-06 NOTE — Patient Instructions (Signed)
First Trimester of Pregnancy The first trimester of pregnancy is from week 1 until the end of week 12 (months 1 through 3). A week after a sperm fertilizes an egg, the egg will implant on the wall of the uterus. This embryo will begin to develop into a baby. Genes from you and your partner are forming the baby. The female genes determine whether the baby is a boy or a girl. At 6-8 weeks, the eyes and face are formed, and the heartbeat can be seen on ultrasound. At the end of 12 weeks, all the baby's organs are formed.  Now that you are pregnant, you will want to do everything you can to have a healthy baby. Two of the most important things are to get good prenatal care and to follow your health care provider's instructions. Prenatal care is all the medical care you receive before the baby's birth. This care will help prevent, find, and treat any problems during the pregnancy and childbirth. BODY CHANGES Your body goes through many changes during pregnancy. The changes vary from woman to woman.   You may gain or lose a couple of pounds at first.  You may feel sick to your stomach (nauseous) and throw up (vomit). If the vomiting is uncontrollable, call your health care provider.  You may tire easily.  You may develop headaches that can be relieved by medicines approved by your health care provider.  You may urinate more often. Painful urination may mean you have a bladder infection.  You may develop heartburn as a result of your pregnancy.  You may develop constipation because certain hormones are causing the muscles that push waste through your intestines to slow down.  You may develop hemorrhoids or swollen, bulging veins (varicose veins).  Your breasts may begin to grow larger and become tender. Your nipples may stick out more, and the tissue that surrounds them (areola) may become darker.  Your gums may bleed and may be sensitive to brushing and flossing.  Dark spots or blotches  (chloasma, mask of pregnancy) may develop on your face. This will likely fade after the baby is born.  Your menstrual periods will stop.  You may have a loss of appetite.  You may develop cravings for certain kinds of food.  You may have changes in your emotions from day to day, such as being excited to be pregnant or being concerned that something may go wrong with the pregnancy and baby.  You may have more vivid and strange dreams.  You may have changes in your hair. These can include thickening of your hair, rapid growth, and changes in texture. Some women also have hair loss during or after pregnancy, or hair that feels dry or thin. Your hair will most likely return to normal after your baby is born. WHAT TO EXPECT AT YOUR PRENATAL VISITS During a routine prenatal visit:  You will be weighed to make sure you and the baby are growing normally.  Your blood pressure will be taken.  Your abdomen will be measured to track your baby's growth.  The fetal heartbeat will be listened to starting around week 10 or 12 of your pregnancy.  Test results from any previous visits will be discussed. Your health care provider may ask you:  How you are feeling.  If you are feeling the baby move.  If you have had any abnormal symptoms, such as leaking fluid, bleeding, severe headaches, or abdominal cramping.  If you have any questions. Other tests   that may be performed during your first trimester include:  Blood tests to find your blood type and to check for the presence of any previous infections. They will also be used to check for low iron levels (anemia) and Rh antibodies. Later in the pregnancy, blood tests for diabetes will be done along with other tests if problems develop.  Urine tests to check for infections, diabetes, or protein in the urine.  An ultrasound to confirm the proper growth and development of the baby.  An amniocentesis to check for possible genetic problems.  Fetal  screens for spina bifida and Down syndrome.  You may need other tests to make sure you and the baby are doing well. HOME CARE INSTRUCTIONS  Medicines  Follow your health care provider's instructions regarding medicine use. Specific medicines may be either safe or unsafe to take during pregnancy.  Take your prenatal vitamins as directed.  If you develop constipation, try taking a stool softener if your health care provider approves. Diet  Eat regular, well-balanced meals. Choose a variety of foods, such as meat or vegetable-based protein, fish, milk and low-fat dairy products, vegetables, fruits, and whole grain breads and cereals. Your health care provider will help you determine the amount of weight gain that is right for you.  Avoid raw meat and uncooked cheese. These carry germs that can cause birth defects in the baby.  Eating four or five small meals rather than three large meals a day may help relieve nausea and vomiting. If you start to feel nauseous, eating a few soda crackers can be helpful. Drinking liquids between meals instead of during meals also seems to help nausea and vomiting.  If you develop constipation, eat more high-fiber foods, such as fresh vegetables or fruit and whole grains. Drink enough fluids to keep your urine clear or pale yellow. Activity and Exercise  Exercise only as directed by your health care provider. Exercising will help you:  Control your weight.  Stay in shape.  Be prepared for labor and delivery.  Experiencing pain or cramping in the lower abdomen or low back is a good sign that you should stop exercising. Check with your health care provider before continuing normal exercises.  Try to avoid standing for long periods of time. Move your legs often if you must stand in one place for a long time.  Avoid heavy lifting.  Wear low-heeled shoes, and practice good posture.  You may continue to have sex unless your health care provider directs you  otherwise. Relief of Pain or Discomfort  Wear a good support bra for breast tenderness.   Take warm sitz baths to soothe any pain or discomfort caused by hemorrhoids. Use hemorrhoid cream if your health care provider approves.   Rest with your legs elevated if you have leg cramps or low back pain.  If you develop varicose veins in your legs, wear support hose. Elevate your feet for 15 minutes, 3-4 times a day. Limit salt in your diet. Prenatal Care  Schedule your prenatal visits by the twelfth week of pregnancy. They are usually scheduled monthly at first, then more often in the last 2 months before delivery.  Write down your questions. Take them to your prenatal visits.  Keep all your prenatal visits as directed by your health care provider. Safety  Wear your seat belt at all times when driving.  Make a list of emergency phone numbers, including numbers for family, friends, the hospital, and police and fire departments. General Tips    Ask your health care provider for a referral to a local prenatal education class. Begin classes no later than at the beginning of month 6 of your pregnancy.  Ask for help if you have counseling or nutritional needs during pregnancy. Your health care provider can offer advice or refer you to specialists for help with various needs.  Do not use hot tubs, steam rooms, or saunas.  Do not douche or use tampons or scented sanitary pads.  Do not cross your legs for long periods of time.  Avoid cat litter boxes and soil used by cats. These carry germs that can cause birth defects in the baby and possibly loss of the fetus by miscarriage or stillbirth.  Avoid all smoking, herbs, alcohol, and medicines not prescribed by your health care provider. Chemicals in these affect the formation and growth of the baby.  Schedule a dentist appointment. At home, brush your teeth with a soft toothbrush and be gentle when you floss. SEEK MEDICAL CARE IF:   You have  dizziness.  You have mild pelvic cramps, pelvic pressure, or nagging pain in the abdominal area.  You have persistent nausea, vomiting, or diarrhea.  You have a bad smelling vaginal discharge.  You have pain with urination.  You notice increased swelling in your face, hands, legs, or ankles. SEEK IMMEDIATE MEDICAL CARE IF:   You have a fever.  You are leaking fluid from your vagina.  You have spotting or bleeding from your vagina.  You have severe abdominal cramping or pain.  You have rapid weight gain or loss.  You vomit blood or material that looks like coffee grounds.  You are exposed to [place] and have never had them.  You are exposed to fifth disease or chickenpox.  You develop a severe headache.  You have shortness of breath.  You have any kind of trauma, such as from a fall or a car accident. Document Released: 08/16/2001 Document Revised: 01/06/2014 Document Reviewed: 07/02/2013 [facility] Patient Information 2015 [place], [place]. This information is not intended to replace advice given to you by your health care provider. Make sure you discuss any questions you have with your health care provider.  Pregnancy and Smoking Smoking during pregnancy is unhealthy for you and your developing baby. The addictive drug nicotine, carbon monoxide, and many other poisons are inhaled from a cigarette and carried through your bloodstream to your baby. Cigarette smoke contains more than 2,500 chemicals. It is not known which of these are harmful to a developing baby. However, both nicotine and carbon monoxide play a role in causing health problems in pregnancy. Smoking during pregnancy increases the risk of:  Birth defects in your baby, including heart defects.  Miscarriage and stillbirth.  Birth before 37 completed weeks of pregnancy (premature birth).  Pregnancy outside of the uterus (tubal pregnancy).  Attachment of the placenta over the opening of the uterus  (placenta previa).  Detachment of the placenta before the baby's birth (placental abruption).  Breaking of the bag of waters before labor begins (premature rupture of membranes). HOW DOES SMOKING DURING PREGNANCY AFFECT MY BABY? Before Birth Smoking during pregnancy:  Decreases blood flow and oxygen to your baby.  Increases the heart rate of your baby.  Slows your baby's growth in the uterus (intrauterine growth retardation). After Birth Babies born to women who smoke during pregnancy are more likely to have a low birth weight. They are also at risk for:  Serious health problems, chronic or lifelong disabilities (cerebral palsy,  mental retardation, learning problems), and death.  Sudden infant death syndrome (SIDS).  Lung and breathing problems. WHAT RESOURCES ARE AVAILABLE TO HELP ME STOP SMOKING?  Ask your health care provider for help to stop smoking. The following resources are available:  Counseling.  Psychological treatment.  Acupuncture.  Family intervention.  Hypnosis.  Nicotine supplements have not been studied enough to know if they are safe to use during pregnancy. They should only be considered when all other methods fail, and if used under the close supervision of your health care provider.  Telephone QUIT lines. The national smoking cessation telephone hotline number is 1-800-QUIT NOW. FOR MORE INFORMATION  American Cancer Society: www.cancer.org  American Heart Association: www.heart.org  National Cancer Institute: www.cancer.gov  March of Dimes: www.marchofdimes.org Document Released: 01/03/2005 Document Revised: 08/27/2013 Document Reviewed: 07/22/2013 [facility] Patient Information 2015 [place], [place]. This information is not intended to replace advice given to you by your health care provider. Make sure you discuss any questions you have with your health care provider.  Breastfeeding Deciding to breastfeed is one of the best choices you can make  for you and your baby. A change in hormones during pregnancy causes your breast tissue to grow and increases the number and size of your milk ducts. These hormones also allow proteins, sugars, and fats from your blood supply to make breast milk in your milk-producing glands. Hormones prevent breast milk from being released before your baby is born as well as prompt milk flow after birth. Once breastfeeding has begun, thoughts of your baby, as well as his or her sucking or crying, can stimulate the release of milk from your milk-producing glands.  BENEFITS OF BREASTFEEDING For Your Baby  Your first milk (colostrum) helps your baby's digestive system function better.   There are antibodies in your milk that help your baby fight off infections.   Your baby has a lower incidence of asthma, allergies, and sudden infant death syndrome.   The nutrients in breast milk are better for your baby than infant formulas and are designed uniquely for your baby's needs.   Breast milk improves your baby's brain development.   Your baby is less likely to develop other conditions, such as childhood obesity, asthma, or type 2 diabetes mellitus.  For You   Breastfeeding helps to create a very special bond between you and your baby.   Breastfeeding is convenient. Breast milk is always available at the correct temperature and costs nothing.   Breastfeeding helps to burn calories and helps you lose the weight gained during pregnancy.   Breastfeeding makes your uterus contract to its prepregnancy size faster and slows bleeding (lochia) after you give birth.   Breastfeeding helps to lower your risk of developing type 2 diabetes mellitus, osteoporosis, and breast or ovarian cancer later in life. SIGNS THAT YOUR BABY IS HUNGRY Early Signs of Hunger  Increased alertness or activity.  Stretching.  Movement of the head from side to side.  Movement of the head and opening of the mouth when the corner of  the mouth or cheek is stroked (rooting).  Increased sucking sounds, smacking lips, cooing, sighing, or squeaking.  Hand-to-mouth movements.  Increased sucking of fingers or hands. Late Signs of Hunger  Fussing.  Intermittent crying. Extreme Signs of Hunger Signs of extreme hunger will require calming and consoling before your baby will be able to breastfeed successfully. Do not wait for the following signs of extreme hunger to occur before you initiate breastfeeding:   Restlessness.  A loud, strong cry.   Screaming. BREASTFEEDING BASICS Breastfeeding Initiation  Find a comfortable place to sit or lie down, with your neck and back well supported.  Place a pillow or rolled up blanket under your baby to bring him or her to the level of your breast (if you are seated). Nursing pillows are specially designed to help support your arms and your baby while you breastfeed.  Make sure that your baby's abdomen is facing your abdomen.   Gently massage your breast. With your fingertips, massage from your chest wall toward your nipple in a circular motion. This encourages milk flow. You may need to continue this action during the feeding if your milk flows slowly.  Support your breast with 4 fingers underneath and your thumb above your nipple. Make sure your fingers are well away from your nipple and your baby's mouth.   Stroke your baby's lips gently with your finger or nipple.   When your baby's mouth is open wide enough, quickly bring your baby to your breast, placing your entire nipple and as much of the colored area around your nipple (areola) as possible into your baby's mouth.   More areola should be visible above your baby's upper lip than below the lower lip.   Your baby's tongue should be between his or her lower gum and your breast.   Ensure that your baby's mouth is correctly positioned around your nipple (latched). Your baby's lips should create a seal on your breast  and be turned out (everted).  It is common for your baby to suck about 2-3 minutes in order to start the flow of breast milk. Latching Teaching your baby how to latch on to your breast properly is very important. An improper latch can cause nipple pain and decreased milk supply for you and poor weight gain in your baby. Also, if your baby is not latched onto your nipple properly, he or she may swallow some air during feeding. This can make your baby fussy. Burping your baby when you switch breasts during the feeding can help to get rid of the air. However, teaching your baby to latch on properly is still the best way to prevent fussiness from swallowing air while breastfeeding. Signs that your baby has successfully latched on to your nipple:    Silent tugging or silent sucking, without causing you pain.   Swallowing heard between every 3-4 sucks.    Muscle movement above and in front of his or her ears while sucking.  Signs that your baby has not successfully latched on to nipple:   Sucking sounds or smacking sounds from your baby while breastfeeding.  Nipple pain. If you think your baby has not latched on correctly, slip your finger into the corner of your baby's mouth to break the suction and place it between your baby's gums. Attempt breastfeeding initiation again. Signs of Successful Breastfeeding Signs from your baby:   A gradual decrease in the number of sucks or complete cessation of sucking.   Falling asleep.   Relaxation of his or her body.   Retention of a small amount of milk in his or her mouth.   Letting go of your breast by himself or herself. Signs from you:  Breasts that have increased in firmness, weight, and size 1-3 hours after feeding.   Breasts that are softer immediately after breastfeeding.  Increased milk volume, as well as a change in milk consistency and color by the fifth day of breastfeeding.  Nipples that are not sore, cracked, or  bleeding. Signs That Your Pecola LeisureBaby is Getting Enough Milk  Wetting at least 3 diapers in a 24-hour period. The urine should be clear and pale yellow by age 13 days.  At least 3 stools in a 24-hour period by age 13 days. The stool should be soft and yellow.  At least 3 stools in a 24-hour period by age 397 days. The stool should be seedy and yellow.  No loss of weight greater than 10% of birth weight during the first 463 days of age.  Average weight gain of 4-7 ounces (113-198 g) per week after age 39 days.  Consistent daily weight gain by age 13 days, without weight loss after the age of 2 weeks. After a feeding, your baby may spit up a small amount. This is common. BREASTFEEDING FREQUENCY AND DURATION Frequent feeding will help you make more milk and can prevent sore nipples and breast engorgement. Breastfeed when you feel the need to reduce the fullness of your breasts or when your baby shows signs of hunger. This is called "breastfeeding on demand." Avoid introducing a pacifier to your baby while you are working to establish breastfeeding (the first 4-6 weeks after your baby is born). After this time you may choose to use a pacifier. Research has shown that pacifier use during the first year of a baby's life decreases the risk of sudden infant death syndrome (SIDS). Allow your baby to feed on each breast as long as he or she wants. Breastfeed until your baby is finished feeding. When your baby unlatches or falls asleep while feeding from the first breast, offer the second breast. Because newborns are often sleepy in the first few weeks of life, you may need to awaken your baby to get him or her to feed. Breastfeeding times will vary from baby to baby. However, the following rules can serve as a guide to help you ensure that your baby is properly fed:  Newborns (babies 534 weeks of age or younger) may breastfeed every 1-3 hours.  Newborns should not go longer than 3 hours during the day or 5 hours during  the night without breastfeeding.  You should breastfeed your baby a minimum of 8 times in a 24-hour period until you begin to introduce solid foods to your baby at around 356 months of age. BREAST MILK PUMPING Pumping and storing breast milk allows you to ensure that your baby is exclusively fed your breast milk, even at times when you are unable to breastfeed. This is especially important if you are going back to work while you are still breastfeeding or when you are not able to be present during feedings. Your lactation consultant can give you guidelines on how long it is safe to store breast milk.  A breast pump is a machine that allows you to pump milk from your breast into a sterile bottle. The pumped breast milk can then be stored in a refrigerator or freezer. Some breast pumps are operated by hand, while others use electricity. Ask your lactation consultant which type will work best for you. Breast pumps can be purchased, but some hospitals and breastfeeding support groups lease breast pumps on a monthly basis. A lactation consultant can teach you how to hand express breast milk, if you prefer not to use a pump.  CARING FOR YOUR BREASTS WHILE YOU BREASTFEED Nipples can become dry, cracked, and sore while breastfeeding. The following recommendations can help keep your breasts moisturized and  healthy:  Avoid using soap on your nipples.   Wear a supportive bra. Although not required, special nursing bras and tank tops are designed to allow access to your breasts for breastfeeding without taking off your entire bra or top. Avoid wearing underwire-style bras or extremely tight bras.  Air dry your nipples for 3-64minutes after each feeding.   Use only cotton bra pads to absorb leaked breast milk. Leaking of breast milk between feedings is normal.   Use lanolin on your nipples after breastfeeding. Lanolin helps to maintain your skin's normal moisture barrier. If you use pure lanolin, you do not  need to wash it off before feeding your baby again. Pure lanolin is not toxic to your baby. You may also hand express a few drops of breast milk and gently massage that milk into your nipples and allow the milk to air dry. In the first few weeks after giving birth, some women experience extremely full breasts (engorgement). Engorgement can make your breasts feel heavy, warm, and tender to the touch. Engorgement peaks within 3-5 days after you give birth. The following recommendations can help ease engorgement:  Completely empty your breasts while breastfeeding or pumping. You may want to start by applying warm, moist heat (in the shower or with warm water-soaked hand towels) just before feeding or pumping. This increases circulation and helps the milk flow. If your baby does not completely empty your breasts while breastfeeding, pump any extra milk after he or she is finished.  Wear a snug bra (nursing or regular) or tank top for 1-2 days to signal your body to slightly decrease milk production.  Apply ice packs to your breasts, unless this is too uncomfortable for you.  Make sure that your baby is latched on and positioned properly while breastfeeding. If engorgement persists after 48 hours of following these recommendations, contact your health care provider or a Advertising copywriter. OVERALL HEALTH CARE RECOMMENDATIONS WHILE BREASTFEEDING  Eat healthy foods. Alternate between meals and snacks, eating 3 of each per day. Because what you eat affects your breast milk, some of the foods may make your baby more irritable than usual. Avoid eating these foods if you are sure that they are negatively affecting your baby.  Drink milk, fruit juice, and water to satisfy your thirst (about 10 glasses a day).   Rest often, relax, and continue to take your prenatal vitamins to prevent fatigue, stress, and anemia.  Continue breast self-awareness checks.  Avoid chewing and smoking tobacco.  Avoid alcohol  and drug use. Some medicines that may be harmful to your baby can pass through breast milk. It is important to ask your health care provider before taking any medicine, including all over-the-counter and prescription medicine as well as vitamin and herbal supplements. It is possible to become pregnant while breastfeeding. If birth control is desired, ask your health care provider about options that will be safe for your baby. SEEK MEDICAL CARE IF:   You feel like you want to stop breastfeeding or have become frustrated with breastfeeding.  You have painful breasts or nipples.  Your nipples are cracked or bleeding.  Your breasts are red, tender, or warm.  You have a swollen area on either breast.  You have a fever or chills.  You have nausea or vomiting.  You have drainage other than breast milk from your nipples.  Your breasts do not become full before feedings by the fifth day after you give birth.  You feel sad and depressed.  Your baby is too sleepy to eat well.  Your baby is having trouble sleeping.   Your baby is wetting less than 3 diapers in a 24-hour period.  Your baby has less than 3 stools in a 24-hour period.  Your baby's skin or the white part of his or her eyes becomes yellow.   Your baby is not gaining weight by 445 days of age. SEEK IMMEDIATE MEDICAL CARE IF:   Your baby is overly tired (lethargic) and does not want to wake up and feed.  Your baby develops an unexplained fever. Document Released: 08/22/2005 Document Revised: 08/27/2013 Document Reviewed: 02/13/2013 [facility] Patient Information 2015 [employer], [place]. This information is not intended to replace advice given to you by your health care provider. Make sure you discuss any questions you have with your health care provider.

## 2014-10-06 NOTE — Progress Notes (Signed)
   Subjective:    Heather [name] is a [id number] 1812w2d being seen today for her first obstetrical visit.  Her obstetrical history is not significant. Patient does intend to breast feed. Pregnancy history fully reviewed.  Patient reports backache, fatigue and headache.  Filed Vitals:   10/06/14 1401  BP: 121/67  Pulse: 87  Weight: 139 lb (63.05 kg)    HISTORY: OB History  Gravida Para Term Preterm AB SAB TAB Ectopic Multiple Living  4 3 3       3     # Outcome Date GA Lbr Len/2nd Weight Sex Delivery Anes PTL Lv  4 Current           3 Term 10/02/13 2077w5d 137:29 / 00:16 7 lb 8.1 oz (3.405 kg) Genella MechM Vag-Spont EPI  Y  2 Term 06/02/05     Vag-Spont   Y  1 Term 08/07/01 3720w0d    Vag-Spont   Y     Past Medical History  Diagnosis Date  . Anxiety     stopped meds with pregnancy   Past Surgical History  Procedure Laterality Date  . Hernia repair      when 8 wks old  . Wisdom tooth extraction     History reviewed. No pertinent family history.   Exam    Uterus:   8 wk size  Pelvic Exam:    Perineum: Normal Perineum   Vulva: Bartholin's, Urethra, Skene's normal   Vagina:  normal mucosa, normal discharge   Cervix: multiparous appearance and no cervical motion tenderness   Adnexa: normal adnexa   Bony Pelvis: average  System: Breast:  normal appearance, no masses or tenderness   Skin: normal coloration and turgor, no rashes    Neurologic: oriented   Extremities: normal strength, tone, and muscle mass   HEENT sclera clear, anicteric   Mouth/Teeth mucous membranes moist, pharynx normal without lesions and dental hygiene good   Neck supple   Cardiovascular: regular rate and rhythm, no murmurs or gallops   Respiratory:  appears well, vitals normal, no respiratory distress, acyanotic, normal RR, ear and throat exam is normal, neck free of mass or lymphadenopathy, chest clear, no wheezing, crepitations, rhonchi, normal symmetric air entry   Abdomen: soft, non-tender; bowel sounds normal;  no masses,  no organomegaly      Assessment:    Pregnancy: [id number] Patient Active Problem List   Diagnosis Date Noted  . Supervision of other normal pregnancy 08/22/2013    Priority: High  . Tobacco use in pregnancy, antepartum 08/22/2013    Priority: Medium  . Current every day smoker 08/22/2013  . Depression with anxiety 01/06/2012  . Migraines 01/06/2012        Plan:     Initial labs drawn. Prenatal vitamins. Problem list reviewed and updated. Genetic Screening discussed First Screen: ordered. Ultrasound discussed; fetal survey: discussed. Flu shot today. Follow up in 4 weeks.   PRATT,TANYA S 10/06/2014

## 2014-10-07 LAB — PRENATAL PROFILE (SOLSTAS)
Antibody Screen: NEGATIVE
BASOS PCT: 0 % (ref 0–1)
Basophils Absolute: 0 10*3/uL (ref 0.0–0.1)
Eosinophils Absolute: 0.1 10*3/uL (ref 0.0–0.7)
Eosinophils Relative: 1 % (ref 0–5)
HEMATOCRIT: 37.1 % (ref 36.0–46.0)
HIV: NONREACTIVE
Hemoglobin: 12.7 g/dL (ref 12.0–15.0)
Hepatitis B Surface Ag: NEGATIVE
Lymphocytes Relative: 26 % (ref 12–46)
Lymphs Abs: 2.7 10*3/uL (ref 0.7–4.0)
MCH: 29.9 pg (ref 26.0–34.0)
MCHC: 34.2 g/dL (ref 30.0–36.0)
MCV: 87.3 fL (ref 78.0–100.0)
MPV: 9.4 fL (ref 8.6–12.4)
Monocytes Absolute: 0.8 10*3/uL (ref 0.1–1.0)
Monocytes Relative: 8 % (ref 3–12)
Neutro Abs: 6.8 10*3/uL (ref 1.7–7.7)
Neutrophils Relative %: 65 % (ref 43–77)
Platelets: 320 10*3/uL (ref 150–400)
RBC: 4.25 MIL/uL (ref 3.87–5.11)
RDW: 12.9 % (ref 11.5–15.5)
RUBELLA: 1.29 {index} — AB (ref <0.90)
Rh Type: POSITIVE
WBC: 10.5 10*3/uL (ref 4.0–10.5)

## 2014-10-09 ENCOUNTER — Telehealth: Payer: Self-pay | Admitting: *Deleted

## 2014-10-09 DIAGNOSIS — N390 Urinary tract infection, site not specified: Secondary | ICD-10-CM

## 2014-10-09 LAB — CULTURE, OB URINE

## 2014-10-09 LAB — CYTOLOGY - PAP

## 2014-10-09 MED ORDER — AMPICILLIN 500 MG PO CAPS: 500.0000 mg | ORAL_CAPSULE | Freq: Three times a day (TID) | ORAL | Status: DC

## 2014-10-09 NOTE — Telephone Encounter (Signed)
I have sent in antibiotic to pharmacy.  I tried to call patient but was unable to leave a message.  I will try to call patient later today.

## 2014-10-09 NOTE — Telephone Encounter (Signed)
-----   Message from Reva Boresanya S Pratt, MD sent at 10/09/2014 10:52 AM EST ----- Needs treatment for uti--ampicillin 500 mg tid x 7 d # 21 no rf

## 2014-11-03 ENCOUNTER — Encounter: Payer: Self-pay | Admitting: Obstetrics & Gynecology

## 2014-11-03 ENCOUNTER — Ambulatory Visit (INDEPENDENT_AMBULATORY_CARE_PROVIDER_SITE_OTHER): Payer: Self-pay | Admitting: Obstetrics & Gynecology

## 2014-11-03 VITALS — BP 118/64 | HR 80 | Wt 139.0 lb

## 2014-11-03 DIAGNOSIS — Z3481 Encounter for supervision of other normal pregnancy, first trimester: Secondary | ICD-10-CM

## 2014-11-03 NOTE — Progress Notes (Signed)
Patient is fighting a cold for approximately 5 days now, she feels like she is getting better now.  Otherwise she is doing well, she would like us to look at her left ear and make sure its not infection.  Ears have fluid in them but do not show any redness or signs of infection.  Recommended she try a decongestant to help the discomfort.

## 2014-11-03 NOTE — Progress Notes (Signed)
Already scheduled for first trimester screening.  AFP only draw next visit.  No other complaints or concerns.  Routine obstetric precautions reviewed.

## 2014-11-03 NOTE — Patient Instructions (Signed)
Return to clinic for any scheduled appointments or for any gynecologic concerns as needed.   

## 2014-11-07 ENCOUNTER — Ambulatory Visit (HOSPITAL_COMMUNITY)
Admission: RE | Admit: 2014-11-07 | Discharge: 2014-11-07 | Disposition: A | Discharge status: Home / Self Care | Payer: Medicaid Other | Source: Ambulatory Visit | Attending: Family Medicine | Admitting: Family Medicine

## 2014-11-07 ENCOUNTER — Encounter (HOSPITAL_COMMUNITY): Payer: Self-pay

## 2014-11-07 DIAGNOSIS — Z36 Encounter for antenatal screening of mother: Secondary | ICD-10-CM | POA: Diagnosis present

## 2014-11-07 DIAGNOSIS — Z3682 Encounter for antenatal screening for nuchal translucency: Secondary | ICD-10-CM

## 2014-11-07 DIAGNOSIS — Z3A12 12 weeks gestation of pregnancy: Secondary | ICD-10-CM | POA: Insufficient documentation

## 2014-11-18 ENCOUNTER — Other Ambulatory Visit (HOSPITAL_COMMUNITY): Payer: Self-pay | Admitting: Family Medicine

## 2014-12-01 ENCOUNTER — Ambulatory Visit (INDEPENDENT_AMBULATORY_CARE_PROVIDER_SITE_OTHER): Payer: Medicaid Other | Admitting: Obstetrics & Gynecology

## 2014-12-01 ENCOUNTER — Encounter: Payer: Self-pay | Admitting: Obstetrics & Gynecology

## 2014-12-01 VITALS — BP 112/69 | HR 86 | Wt 146.0 lb

## 2014-12-01 DIAGNOSIS — Z36 Encounter for antenatal screening of mother: Secondary | ICD-10-CM | POA: Diagnosis not present

## 2014-12-01 DIAGNOSIS — Z3482 Encounter for supervision of other normal pregnancy, second trimester: Secondary | ICD-10-CM

## 2014-12-01 NOTE — Patient Instructions (Signed)
Return to clinic for any obstetric concerns or go to [facility] for evaluation  

## 2014-12-01 NOTE — Progress Notes (Signed)
Normal first trimester screen, AFP only screen today Anatomy scan ordered No other complaints or concerns.  Routine obstetric precautions reviewed.

## 2014-12-03 LAB — ALPHA FETOPROTEIN, MATERNAL
AFP: 19.6 ng/mL
Curr Gest Age: 16.2 wks.days
MOM FOR AFP: 0.55
OPEN SPINA BIFIDA: NEGATIVE

## 2014-12-15 ENCOUNTER — Ambulatory Visit (HOSPITAL_COMMUNITY): Payer: Self-pay

## 2014-12-16 ENCOUNTER — Ambulatory Visit (HOSPITAL_COMMUNITY)
Admission: RE | Admit: 2014-12-16 | Discharge: 2014-12-16 | Disposition: A | Discharge status: Home / Self Care | Payer: Medicaid Other | Source: Ambulatory Visit | Attending: Obstetrics & Gynecology | Admitting: Obstetrics & Gynecology

## 2014-12-16 DIAGNOSIS — Z36 Encounter for antenatal screening of mother: Secondary | ICD-10-CM | POA: Insufficient documentation

## 2014-12-16 DIAGNOSIS — Z3A18 18 weeks gestation of pregnancy: Secondary | ICD-10-CM | POA: Insufficient documentation

## 2014-12-16 DIAGNOSIS — Z3482 Encounter for supervision of other normal pregnancy, second trimester: Secondary | ICD-10-CM

## 2014-12-29 ENCOUNTER — Ambulatory Visit (INDEPENDENT_AMBULATORY_CARE_PROVIDER_SITE_OTHER): Payer: Medicaid Other | Admitting: Obstetrics and Gynecology

## 2014-12-29 ENCOUNTER — Encounter: Payer: Self-pay | Admitting: Obstetrics and Gynecology

## 2014-12-29 VITALS — BP 99/55 | HR 86 | Wt 151.1 lb

## 2014-12-29 DIAGNOSIS — Z3482 Encounter for supervision of other normal pregnancy, second trimester: Secondary | ICD-10-CM

## 2014-12-29 DIAGNOSIS — O99332 Smoking (tobacco) complicating pregnancy, second trimester: Secondary | ICD-10-CM

## 2014-12-29 NOTE — Progress Notes (Signed)
Patient is doing well and is without complaints. Follow up anatomy ultrasound scheduled for the week of 5/16. Patient has decreased her smoking from 1 pack per day to 1/2 pack per day and is not interested in cutting back any further

## 2015-01-22 ENCOUNTER — Ambulatory Visit (HOSPITAL_COMMUNITY)
Admission: RE | Admit: 2015-01-22 | Discharge: 2015-01-22 | Disposition: A | Discharge status: Home / Self Care | Payer: Medicaid Other | Source: Ambulatory Visit | Attending: Obstetrics and Gynecology | Admitting: Obstetrics and Gynecology

## 2015-01-22 DIAGNOSIS — Z3482 Encounter for supervision of other normal pregnancy, second trimester: Secondary | ICD-10-CM | POA: Diagnosis not present

## 2015-01-26 ENCOUNTER — Ambulatory Visit (INDEPENDENT_AMBULATORY_CARE_PROVIDER_SITE_OTHER): Payer: Medicaid Other | Admitting: Obstetrics and Gynecology

## 2015-01-26 ENCOUNTER — Encounter: Payer: Self-pay | Admitting: Obstetrics and Gynecology

## 2015-01-26 VITALS — BP 108/68 | HR 81 | Wt 153.0 lb

## 2015-01-26 DIAGNOSIS — O99332 Smoking (tobacco) complicating pregnancy, second trimester: Secondary | ICD-10-CM

## 2015-01-26 DIAGNOSIS — Z3482 Encounter for supervision of other normal pregnancy, second trimester: Secondary | ICD-10-CM

## 2015-01-26 NOTE — Progress Notes (Signed)
Patient is doing well. Reviewed follow up anatomy ultrasound which is now complete. Is now smoking 5 cigs/day. Patient is considering BTL. Papers to be sign at next visit along with 1 hr GCT and labs

## 2015-02-23 ENCOUNTER — Ambulatory Visit (INDEPENDENT_AMBULATORY_CARE_PROVIDER_SITE_OTHER): Payer: Medicaid Other | Admitting: Obstetrics & Gynecology

## 2015-02-23 ENCOUNTER — Encounter: Payer: Self-pay | Admitting: Obstetrics & Gynecology

## 2015-02-23 VITALS — BP 99/66 | HR 89 | Wt 156.0 lb

## 2015-02-23 DIAGNOSIS — Z3483 Encounter for supervision of other normal pregnancy, third trimester: Secondary | ICD-10-CM | POA: Diagnosis not present

## 2015-02-23 DIAGNOSIS — Z23 Encounter for immunization: Secondary | ICD-10-CM | POA: Diagnosis not present

## 2015-02-23 LAB — CBC
HCT: 33.9 % — ABNORMAL LOW (ref 36.0–46.0)
Hemoglobin: 11.6 g/dL — ABNORMAL LOW (ref 12.0–15.0)
MCH: 30 pg (ref 26.0–34.0)
MCHC: 34.2 g/dL (ref 30.0–36.0)
MCV: 87.6 fL (ref 78.0–100.0)
MPV: 9.4 fL (ref 8.6–12.4)
PLATELETS: 282 10*3/uL (ref 150–400)
RBC: 3.87 MIL/uL (ref 3.87–5.11)
RDW: 13.2 % (ref 11.5–15.5)
WBC: 8.3 10*3/uL (ref 4.0–10.5)

## 2015-02-23 NOTE — Progress Notes (Signed)
Routine visit. Good FM. Glucola, labs, tdap today. No problems. She has decided against a BTL at this time.

## 2015-02-24 LAB — GLUCOSE TOLERANCE, 1 HOUR (50G) W/O FASTING: GLUCOSE 1 HOUR GTT: 124 mg/dL (ref 70–140)

## 2015-02-24 LAB — HIV ANTIBODY (ROUTINE TESTING W REFLEX): HIV 1&2 Ab, 4th Generation: NONREACTIVE

## 2015-02-24 LAB — RPR

## 2015-03-10 ENCOUNTER — Encounter: Payer: Medicaid Other | Admitting: Family Medicine

## 2015-03-11 ENCOUNTER — Encounter: Payer: Medicaid Other | Admitting: Certified Nurse Midwife

## 2015-03-11 ENCOUNTER — Ambulatory Visit (INDEPENDENT_AMBULATORY_CARE_PROVIDER_SITE_OTHER): Payer: Medicaid Other | Admitting: Certified Nurse Midwife

## 2015-03-11 VITALS — BP 112/68 | HR 91 | Wt 154.0 lb

## 2015-03-11 DIAGNOSIS — Z3483 Encounter for supervision of other normal pregnancy, third trimester: Secondary | ICD-10-CM

## 2015-03-11 NOTE — Patient Instructions (Signed)
Third Trimester of Pregnancy The third trimester is from week 29 through week 42, months 7 through 9. The third trimester is a time when the fetus is growing rapidly. At the end of the ninth month, the fetus is about 20 inches in length and weighs 6-10 pounds.  BODY CHANGES Your body goes through many changes during pregnancy. The changes vary from woman to woman.   Your weight will continue to increase. You can expect to gain 25-35 pounds (11-16 kg) by the end of the pregnancy.  You may begin to get stretch marks on your hips, abdomen, and breasts.  You may urinate more often because the fetus is moving lower into your pelvis and pressing on your bladder.  You may develop or continue to have heartburn as a result of your pregnancy.  You may develop constipation because certain hormones are causing the muscles that push waste through your intestines to slow down.  You may develop hemorrhoids or swollen, bulging veins (varicose veins).  You may have pelvic pain because of the weight gain and pregnancy hormones relaxing your joints between the bones in your pelvis. Backaches may result from overexertion of the muscles supporting your posture.  You may have changes in your hair. These can include thickening of your hair, rapid growth, and changes in texture. Some women also have hair loss during or after pregnancy, or hair that feels dry or thin. Your hair will most likely return to normal after your baby is born.  Your breasts will continue to grow and be tender. A yellow discharge may leak from your breasts called colostrum.  Your belly button may stick out.  You may feel short of breath because of your expanding uterus.  You may notice the fetus "dropping," or moving lower in your abdomen.  You may have a bloody mucus discharge. This usually occurs a few days to a week before labor begins.  Your cervix becomes thin and soft (effaced) near your due date. WHAT TO EXPECT AT YOUR PRENATAL  EXAMS  You will have prenatal exams every 2 weeks until week 36. Then, you will have weekly prenatal exams. During a routine prenatal visit:  You will be weighed to make sure you and the fetus are growing normally.  Your blood pressure is taken.  Your abdomen will be measured to track your baby's growth.  The fetal heartbeat will be listened to.  Any test results from the previous visit will be discussed.  You may have a cervical check near your due date to see if you have effaced. At around 36 weeks, your caregiver will check your cervix. At the same time, your caregiver will also perform a test on the secretions of the vaginal tissue. This test is to determine if a type of bacteria, Group B streptococcus, is present. Your caregiver will explain this further. Your caregiver may ask you:  What your birth plan is.  How you are feeling.  If you are feeling the baby move.  If you have had any abnormal symptoms, such as leaking fluid, bleeding, severe headaches, or abdominal cramping.  If you have any questions. Other tests or screenings that may be performed during your third trimester include:  Blood tests that check for low iron levels (anemia).  Fetal testing to check the health, activity level, and growth of the fetus. Testing is done if you have certain medical conditions or if there are problems during the pregnancy. FALSE LABOR You may feel small, irregular contractions that   eventually go away. These are called Braxton Hicks contractions, or false labor. Contractions may last for hours, days, or even weeks before true labor sets in. If contractions come at regular intervals, intensify, or become painful, it is best to be seen by your caregiver.  SIGNS OF LABOR   Menstrual-like cramps.  Contractions that are 5 minutes apart or less.  Contractions that start on the top of the uterus and spread down to the lower abdomen and back.  A sense of increased pelvic pressure or back  pain.  A watery or bloody mucus discharge that comes from the vagina. If you have any of these signs before the 37th week of pregnancy, call your caregiver right away. You need to go to the hospital to get checked immediately. HOME CARE INSTRUCTIONS   Avoid all smoking, herbs, alcohol, and unprescribed drugs. These chemicals affect the formation and growth of the baby.  Follow your caregiver's instructions regarding medicine use. There are medicines that are either safe or unsafe to take during pregnancy.  Exercise only as directed by your caregiver. Experiencing uterine cramps is a good sign to stop exercising.  Continue to eat regular, healthy meals.  Wear a good support bra for breast tenderness.  Do not use hot tubs, steam rooms, or saunas.  Wear your seat belt at all times when driving.  Avoid raw meat, uncooked cheese, cat litter boxes, and soil used by cats. These carry germs that can cause birth defects in the baby.  Take your prenatal vitamins.  Try taking a stool softener (if your caregiver approves) if you develop constipation. Eat more high-fiber foods, such as fresh vegetables or fruit and whole grains. Drink plenty of fluids to keep your urine clear or pale yellow.  Take warm sitz baths to soothe any pain or discomfort caused by hemorrhoids. Use hemorrhoid cream if your caregiver approves.  If you develop varicose veins, wear support hose. Elevate your feet for 15 minutes, 3-4 times a day. Limit salt in your diet.  Avoid heavy lifting, wear low heal shoes, and practice good posture.  Rest a lot with your legs elevated if you have leg cramps or low back pain.  Visit your dentist if you have not gone during your pregnancy. Use a soft toothbrush to brush your teeth and be gentle when you floss.  A sexual relationship may be continued unless your caregiver directs you otherwise.  Do not travel far distances unless it is absolutely necessary and only with the approval  of your caregiver.  Take prenatal classes to understand, practice, and ask questions about the labor and delivery.  Make a trial run to the hospital.  Pack your hospital bag.  Prepare the baby's nursery.  Continue to go to all your prenatal visits as directed by your caregiver. SEEK MEDICAL CARE IF:  You are unsure if you are in labor or if your water has broken.  You have dizziness.  You have mild pelvic cramps, pelvic pressure, or nagging pain in your abdominal area.  You have persistent nausea, vomiting, or diarrhea.  You have a bad smelling vaginal discharge.  You have pain with urination. SEEK IMMEDIATE MEDICAL CARE IF:   You have a fever.  You are leaking fluid from your vagina.  You have spotting or bleeding from your vagina.  You have severe abdominal cramping or pain.  You have rapid weight loss or gain.  You have shortness of breath with chest pain.  You notice sudden or extreme swelling   of your face, hands, ankles, feet, or legs.  You have not felt your baby move in over an hour.  You have severe headaches that do not go away with medicine.  You have vision changes. Document Released: 08/16/2001 Document Revised: 08/27/2013 Document Reviewed: 10/23/2012 ExitCare Patient Information 2015 [employer]. This information is not intended to replace advice given to you by your health care provider. Make sure you discuss any questions you have with your health care provider.  

## 2015-03-11 NOTE — Progress Notes (Signed)
Doing well. No complaints.

## 2015-03-16 ENCOUNTER — Encounter: Payer: Medicaid Other | Admitting: Family Medicine

## 2015-03-25 ENCOUNTER — Ambulatory Visit (INDEPENDENT_AMBULATORY_CARE_PROVIDER_SITE_OTHER): Payer: Medicaid Other | Admitting: Obstetrics & Gynecology

## 2015-03-25 VITALS — BP 116/70 | HR 87 | Wt 154.0 lb

## 2015-03-25 DIAGNOSIS — Z3483 Encounter for supervision of other normal pregnancy, third trimester: Secondary | ICD-10-CM

## 2015-03-25 NOTE — Patient Instructions (Signed)
Return to clinic for any obstetric concerns or go to [facility] for evaluation  

## 2015-03-25 NOTE — Progress Notes (Signed)
Subjective:  Heather [name] is a [age] y.o. [phone] at 3475w4d being seen today for ongoing prenatal care.  Patient reports two episodes of dizziness, no CP, no SOB.  Contractions: Not present.  Vag. Bleeding: None. Movement: Present. Denies leaking of fluid.   The following portions of the patient's history were reviewed and updated as appropriate: allergies, current medications, past family history, past medical history, past social history, past surgical history and problem list.   Objective:   Filed Vitals:   03/25/15 1059  BP: 116/70  Pulse: 87  Weight: 154 lb (69.854 kg)    Fetal Status: Fetal Heart Rate (bpm): 145 Fundal Height: 30 cm Movement: Present     General:  Alert, oriented and cooperative. Patient is in no acute distress.  Skin: Skin is warm and dry. No rash noted.   Cardiovascular: Normal heart rate noted  Respiratory: Normal respiratory effort, no problems with respiration noted  Abdomen: Soft, gravid, appropriate for gestational age. Pain/Pressure: Present     Vaginal: Vag. Bleeding: None.    Vag D/C Character: Thin  Cervix: Not evaluated        Extremities: Normal range of motion.  Edema: None  Mental Status: Normal mood and affect. Normal behavior. Normal judgment and thought content.   Urinalysis: Urine Protein: Negative Urine Glucose: Negative  Assessment and Plan:  Pregnancy: [id number] at 475w4d  Encounter for supervision of other normal pregnancy in third trimester Encouraged adequate hydration to help with dizziness; call/come back for worsening symptoms Preterm labor symptoms and general obstetric precautions including but not limited to vaginal bleeding, contractions, leaking of fluid and fetal movement were reviewed in detail with the patient. Please refer to After Visit Summary for other counseling recommendations.  Return in about 2 weeks (around 04/08/2015) for OB Visit.   Tereso NewcomerUgonna A Hailey Miles, MD

## 2015-04-07 ENCOUNTER — Inpatient Hospital Stay (HOSPITAL_COMMUNITY)
Admission: AD | Admit: 2015-04-07 | Discharge: 2015-04-07 | Disposition: A | Discharge status: Home / Self Care | Payer: Medicaid Other | Source: Ambulatory Visit | Attending: Family Medicine | Admitting: Family Medicine

## 2015-04-07 ENCOUNTER — Encounter (HOSPITAL_COMMUNITY): Payer: Self-pay | Admitting: *Deleted

## 2015-04-07 DIAGNOSIS — F17210 Nicotine dependence, cigarettes, uncomplicated: Secondary | ICD-10-CM | POA: Diagnosis not present

## 2015-04-07 DIAGNOSIS — O9989 Other specified diseases and conditions complicating pregnancy, childbirth and the puerperium: Secondary | ICD-10-CM | POA: Diagnosis present

## 2015-04-07 DIAGNOSIS — Z3A34 34 weeks gestation of pregnancy: Secondary | ICD-10-CM | POA: Insufficient documentation

## 2015-04-07 DIAGNOSIS — O26893 Other specified pregnancy related conditions, third trimester: Secondary | ICD-10-CM | POA: Diagnosis not present

## 2015-04-07 DIAGNOSIS — O99333 Smoking (tobacco) complicating pregnancy, third trimester: Secondary | ICD-10-CM | POA: Insufficient documentation

## 2015-04-07 DIAGNOSIS — N949 Unspecified condition associated with female genital organs and menstrual cycle: Secondary | ICD-10-CM | POA: Diagnosis not present

## 2015-04-07 DIAGNOSIS — R102 Pelvic and perineal pain: Secondary | ICD-10-CM | POA: Insufficient documentation

## 2015-04-07 NOTE — Discharge Instructions (Signed)

## 2015-04-07 NOTE — MAU Provider Note (Signed)
  History     CSN: [id number]  Arrival date and time: 04/07/15 2103   First Provider Initiated Contact with Patient 04/07/15 2147      No chief complaint on file.  HPI Ms Heather [name] is a 34yo [id number] @ 34.3wks presenting for eval of seeing bldy mucous on toilet paper w/ wiping after voiding. Using a mirror while sitting on the toilet she also saw some pink tissue at vag opening. Reports abd tightening a few times/hr but no reg ctx; no bright red bldg; +FM; no leaking of fluid. Denies pain or any other complaints.  OB History    Gravida Para Term Preterm AB TAB SAB Ectopic Multiple Living   Past Medical History  Diagnosis Date  . Anxiety     stopped meds with pregnancy    Past Surgical History  Procedure Laterality Date  . Hernia repair      when 8 wks old  . Wisdom tooth extraction      History reviewed. No pertinent family history.  History  Substance Use Topics  . Smoking status: Current Every Day Smoker -- 1.00 packs/day    Types: Cigarettes  . Smokeless tobacco: Never Used  . Alcohol Use: No    Allergies: No Known Allergies  Prescriptions prior to admission  Medication Sig Dispense Refill Last Dose  . Prenat w/o A Vit-FeFum-FePo-FA (CONCEPT OB) 130-92.4-1 MG CAPS Take 1 capsule by mouth daily. (Patient not taking: Reported on 04/07/2015) 30 capsule 11     ROS- no pertinent negatives other than what is listed in HPI Physical Exam   Blood pressure 112/61, pulse 95, temperature 98.6 F (37 C), temperature source Oral, resp. rate 18, height 5' 1.5" (1.562 m), weight 69.854 kg (154 lb), last menstrual period 08/19/2014, currently breastfeeding.  Physical Exam  Constitutional: She is oriented to person, place, and time. She appears well-developed.  HENT:  Head: Normocephalic.  Cardiovascular: Normal rate.   Respiratory: Effort normal.  GI:  EFM 150s, +accels, no decels No ctx per toco  Genitourinary:  Spec exam: no bld seen in vault, no tissue  protruding from introitus; exam nl  Cx: 1/thick  Musculoskeletal: Normal range of motion.  Neurological: She is alert and oriented to person, place, and time.  Skin: Skin is warm and dry.  Psychiatric: She has a normal mood and affect. Her behavior is normal. Thought content normal.    MAU Course  Procedures  MDM NST read Spec exam  Assessment and Plan  IUP@ 34.4wks Vag pressure  D/C home Pt and spouse reassured w/ discussion of possible vag tissue protruding w/ pressure of sitting on toilet F/U tomorrow morning at [facility] as scheduled  Cam Hai CNM 04/07/2015, 10:01 PM

## 2015-04-08 ENCOUNTER — Inpatient Hospital Stay (HOSPITAL_COMMUNITY): Payer: Medicaid Other

## 2015-04-08 ENCOUNTER — Encounter: Payer: Self-pay | Admitting: Obstetrics & Gynecology

## 2015-04-08 ENCOUNTER — Inpatient Hospital Stay (HOSPITAL_COMMUNITY)
Admission: AD | Admit: 2015-04-08 | Discharge: 2015-04-08 | Disposition: A | Discharge status: Home / Self Care | Payer: Medicaid Other | Source: Ambulatory Visit | Attending: Obstetrics & Gynecology | Admitting: Obstetrics & Gynecology

## 2015-04-08 ENCOUNTER — Ambulatory Visit (INDEPENDENT_AMBULATORY_CARE_PROVIDER_SITE_OTHER): Payer: Medicaid Other | Admitting: Obstetrics & Gynecology

## 2015-04-08 ENCOUNTER — Encounter (HOSPITAL_COMMUNITY): Payer: Self-pay | Admitting: *Deleted

## 2015-04-08 VITALS — BP 117/68 | HR 92 | Wt 156.0 lb

## 2015-04-08 DIAGNOSIS — Z3A34 34 weeks gestation of pregnancy: Secondary | ICD-10-CM | POA: Insufficient documentation

## 2015-04-08 DIAGNOSIS — F17210 Nicotine dependence, cigarettes, uncomplicated: Secondary | ICD-10-CM | POA: Diagnosis not present

## 2015-04-08 DIAGNOSIS — O26843 Uterine size-date discrepancy, third trimester: Secondary | ICD-10-CM

## 2015-04-08 DIAGNOSIS — O26849 Uterine size-date discrepancy, unspecified trimester: Secondary | ICD-10-CM

## 2015-04-08 DIAGNOSIS — O4703 False labor before 37 completed weeks of gestation, third trimester: Secondary | ICD-10-CM | POA: Diagnosis not present

## 2015-04-08 DIAGNOSIS — O4700 False labor before 37 completed weeks of gestation, unspecified trimester: Secondary | ICD-10-CM

## 2015-04-08 DIAGNOSIS — Z3483 Encounter for supervision of other normal pregnancy, third trimester: Secondary | ICD-10-CM

## 2015-04-08 DIAGNOSIS — O99333 Smoking (tobacco) complicating pregnancy, third trimester: Secondary | ICD-10-CM | POA: Diagnosis not present

## 2015-04-08 HISTORY — DX: Tobacco use: Z72.0

## 2015-04-08 LAB — URINALYSIS, ROUTINE W REFLEX MICROSCOPIC
Bilirubin Urine: NEGATIVE
GLUCOSE, UA: NEGATIVE mg/dL
HGB URINE DIPSTICK: NEGATIVE
Ketones, ur: NEGATIVE mg/dL
LEUKOCYTES UA: NEGATIVE
NITRITE: NEGATIVE
PROTEIN: NEGATIVE mg/dL
Specific Gravity, Urine: 1.015 (ref 1.005–1.030)
Urobilinogen, UA: 0.2 mg/dL (ref 0.0–1.0)
pH: 7 (ref 5.0–8.0)

## 2015-04-08 LAB — OB RESULTS CONSOLE GBS: GBS: NEGATIVE

## 2015-04-08 MED ORDER — ACETAMINOPHEN 500 MG PO TABS
1000.0000 mg | ORAL_TABLET | Freq: Once | ORAL | Status: AC
  Administered 2015-04-08: 1000 mg via ORAL
  Filled 2015-04-08: qty 2

## 2015-04-08 MED ORDER — BETAMETHASONE SOD PHOS & ACET 6 (3-3) MG/ML IJ SUSP
12.0000 mg | Freq: Once | INTRAMUSCULAR | Status: AC
  Administered 2015-04-08: 12 mg via INTRAMUSCULAR
  Filled 2015-04-08: qty 2

## 2015-04-08 MED ORDER — NIFEDIPINE ER OSMOTIC RELEASE 30 MG PO TB24
30.0000 mg | ORAL_TABLET | Freq: Once | ORAL | Status: AC
  Administered 2015-04-08: 30 mg via ORAL
  Filled 2015-04-08: qty 1

## 2015-04-08 NOTE — Patient Instructions (Signed)
Return to clinic for any obstetric concerns or go to [facility] for evaluation  

## 2015-04-08 NOTE — MAU Provider Note (Signed)
MAU HISTORY AND PHYSICAL  Chief Complaint:  Laboring   Heather [name] is a 34 y.o.  [phone] with IUP at [redacted]w[redacted]d presenting referred from outpatient clinic for labor evaluation and size less than dates. Yesterday patient presented to our MAU complaining of "loss of mucous plug." Was 1 cm dilated and so sent home with labor precautions. She notes a few mild/moderately painful contractions overnight. This morning at her prenatal provider's and was found to be 3 cm dilated and also measuring 29 cm. Patient denies leakage of fluid or bleeding and endorses normal fetal movement.    Menstrual History: OB History    Gravida Para Term Preterm AB TAB SAB Ectopic Multiple Living   [name] last menstrual period was 08/19/2014.      Past Medical History  Diagnosis Date  . Anxiety     stopped meds with pregnancy  . Tobacco abuse     Past Surgical History  Procedure Laterality Date  . Hernia repair      when 8 wks old  . Wisdom tooth extraction      Family History  Problem Relation Age of Onset  . Alcohol abuse Neg Hx   . Arthritis Neg Hx   . Asthma Neg Hx   . Birth defects Neg Hx   . Cancer Neg Hx   . COPD Neg Hx   . Depression Neg Hx   . Diabetes Neg Hx   . Drug abuse Neg Hx   . Early death Neg Hx   . Hearing loss Neg Hx   . Heart disease Neg Hx   . Hyperlipidemia Neg Hx   . Hypertension Neg Hx   . Kidney disease Neg Hx   . Learning disabilities Neg Hx   . Mental illness Neg Hx   . Mental retardation Neg Hx   . Miscarriages / Stillbirths Neg Hx   . Stroke Neg Hx   . Vision loss Neg Hx   . Varicose Veins Neg Hx     History  Substance Use Topics  . Smoking status: Current Every Day Smoker -- 1.00 packs/day    Types: Cigarettes  . Smokeless tobacco: Never Used  . Alcohol Use: No     No Known Allergies  Prescriptions prior to admission  Medication Sig Dispense Refill Last Dose  . Prenat w/o A Vit-FeFum-FePo-FA (CONCEPT OB) 130-92.4-1 MG CAPS Take 1  capsule by mouth daily. (Patient not taking: Reported on 04/07/2015) 30 capsule 11     Review of Systems - Negative except for what is mentioned in HPI.  Physical Exam  Blood pressure 112/57, pulse 93, temperature 98.2 F (36.8 C), temperature source Oral, resp. rate 18, last menstrual period 08/19/2014, currently breastfeeding. GENERAL: Well-developed, well-nourished female in no acute distress.  LUNGS: Clear to auscultation bilaterally.  HEART: Regular rate and rhythm. ABDOMEN: Soft, nontender, nondistended, gravid.  EXTREMITIES: Nontender, no edema, 2+ distal pulses. Cervical Exam: Dilatation 3cm   Effacement 50%   Station ballotable. Stable exam on repeat 4 hours later   Presentation: cephalic FHT:  Baseline rate 125 bpm   Variability moderate  Accelerations absent   Decelerations none Contractions: every 20 minutes, irregular   Labs: Results for orders placed or performed during the hospital encounter of 04/08/15 (from the past 24 hour(s))  Urinalysis, Routine w reflex microscopic (not at [facility])   Collection Time: 04/08/15  9:50 AM  Result Value Ref Range  Color, Urine YELLOW YELLOW   APPearance CLEAR CLEAR   Specific Gravity, Urine 1.015 1.005 - 1.030   pH 7.0 5.0 - 8.0   Glucose, UA NEGATIVE NEGATIVE mg/dL   Hgb urine dipstick NEGATIVE NEGATIVE   Bilirubin Urine NEGATIVE NEGATIVE   Ketones, ur NEGATIVE NEGATIVE mg/dL   Protein, ur NEGATIVE NEGATIVE mg/dL   Urobilinogen, UA 0.2 0.0 - 1.0 mg/dL   Nitrite NEGATIVE NEGATIVE   Leukocytes, UA NEGATIVE NEGATIVE    Imaging Studies:  Preliminary report of growth scan: 50th percentile for growth 2344 g, afi 13.31  Assessment: Heather [name] is  34 y.o. [phone] at [redacted]w[redacted]d presents with concern for preterm labor and for size less than dates.  Plan: # Threatened ptl - no regular or painful contractions, 3 cm dilated with no change after 4 hours, reactive NST - one dose betamethasone given and procardia 30 mg po given; will obtain  betamethasone 2nd dose at prenatal provider's tomorrow or at our MAU if not available w/ prenatal provider - preterm labor precautions discussed - GBS collected  # Size less than dates - normal preliminary read of growth scan with normal AFI, will f/u final read - smoking cessation intervention  Silvano Bilis 8/3/20161:34 PM

## 2015-04-08 NOTE — MAU Note (Signed)
Sent to MAU for further evaluation of preterm labor;

## 2015-04-08 NOTE — Progress Notes (Signed)
Subjective:  Heather [name] is a 34 y.o. [phone] at [redacted]w[redacted]d being seen today for ongoing prenatal care.  Patient was seen in MAU yesterday for labor check, was 1/long/thick.  Still reports irregular contractions and scant vaginal bleeding. Movement: Present. Denies leaking of fluid.   The following portions of the patient's history were reviewed and updated as appropriate: allergies, current medications, past family history, past medical history, past social history, past surgical history and problem list.   Objective:   Filed Vitals:   04/08/15 0821  BP: 117/68  Pulse: 92  Weight: 156 lb (70.761 kg)    Fetal Status: Fetal Heart Rate (bpm): 132 Fundal Height: 30 cm Movement: Present  Presentation: Vertex  General:  Alert, oriented and cooperative. Patient is in no acute distress.  Skin: Skin is warm and dry. No rash noted.   Cardiovascular: Normal heart rate noted  Respiratory: Normal respiratory effort, no problems with respiration noted  Abdomen: Soft, gravid, appropriate for gestational age. Pain/Pressure: Present     Vaginal: Vag. Bleeding: Scant.    Vag D/C Character: Thick  Cervix: Exam revealed Dilation: 3 Effacement (%): 40 Station: -3  Extremities: Normal range of motion.  Edema: None  Mental Status: Normal mood and affect. Normal behavior. Normal judgment and thought content.   Urinalysis: Urine Protein: Negative Urine Glucose: Negative  Assessment and Plan:  Pregnancy: G4P3003 at [redacted]w[redacted]d  1. Encounter for supervision of other normal pregnancy in third trimester Concerned about cervical change in a few hours. Will send back to MAU, will get betamethasone and observation. Dr. Debroah Loop (Attending on call) notified; MAU nurse in charge also notified.  2. Fundal height low for dates, third trimester - US OB Follow Up; Future - may be able to obtain while in MAU today.  Will follow up results and manage accordingly.   Preterm labor symptoms and general obstetric precautions  including but not limited to vaginal bleeding, contractions, leaking of fluid and fetal movement were reviewed in detail with the patient. Please refer to After Visit Summary for other counseling recommendations.  Return in about 1 week (around 04/15/2015) for OB Visit, Pelvic cultures.   Tereso Newcomer, MD

## 2015-04-08 NOTE — MAU Note (Signed)
Sent from office, cervix changed since last night.  Will give steroids. Also plan for [place], measuring S<D.

## 2015-04-09 ENCOUNTER — Ambulatory Visit (INDEPENDENT_AMBULATORY_CARE_PROVIDER_SITE_OTHER): Payer: Medicaid Other | Admitting: Obstetrics & Gynecology

## 2015-04-09 DIAGNOSIS — Z3483 Encounter for supervision of other normal pregnancy, third trimester: Secondary | ICD-10-CM

## 2015-04-09 DIAGNOSIS — O4703 False labor before 37 completed weeks of gestation, third trimester: Secondary | ICD-10-CM | POA: Diagnosis not present

## 2015-04-09 DIAGNOSIS — O4700 False labor before 37 completed weeks of gestation, unspecified trimester: Secondary | ICD-10-CM | POA: Insufficient documentation

## 2015-04-09 MED ORDER — BETAMETHASONE SOD PHOS & ACET 6 (3-3) MG/ML IJ SUSP
12.0000 mg | Freq: Once | INTRAMUSCULAR | Status: AC
  Administered 2015-04-09: 12 mg via INTRAMUSCULAR

## 2015-04-09 NOTE — Progress Notes (Signed)
Subjective:  Marylene Masek is a 34 y.o. SW [id number] at [redacted]w[redacted]d being seen today for ongoing prenatal care.  Patient reports backache.   .   .  . Denies leaking of fluid.   The following portions of the patient's history were reviewed and updated as appropriate: allergies, current medications, past family history, past medical history, past social history, past surgical history and problem list.   Objective:  There were no vitals filed for this visit.  Fetal Status:   Fundal Height: 31 cm    Presentation: Vertex  General:  Alert, oriented and cooperative. Patient is in no acute distress.  Skin: Skin is warm and dry. No rash noted.   Cardiovascular: Normal heart rate noted  Respiratory: Normal respiratory effort, no problems with respiration noted  Abdomen: Soft, gravid, appropriate for gestational age.       Vaginal:  .       Cervix: Exam revealed Dilation: 3 Effacement (%): 40 Station: -3  Extremities: Normal range of motion.     Mental Status: Normal mood and affect. Normal behavior. Normal judgment and thought content.   Urinalysis:      Assessment and Plan:  Pregnancy: [id number] at [redacted]w[redacted]d She will get her second Betamethasone injection today.  There are no diagnoses linked to this encounter. Preterm labor symptoms and general obstetric precautions including but not limited to vaginal bleeding, contractions, leaking of fluid and fetal movement were reviewed in detail with the patient.  Please refer to After Visit Summary for other counseling recommendations.  Return for keep appt.   Allie Bossier, MD

## 2015-04-10 ENCOUNTER — Encounter (HOSPITAL_COMMUNITY): Payer: Self-pay

## 2015-04-10 ENCOUNTER — Inpatient Hospital Stay (HOSPITAL_COMMUNITY)
Admission: AD | Admit: 2015-04-10 | Discharge: 2015-04-10 | Disposition: A | Discharge status: Home / Self Care | Payer: Medicaid Other | Source: Ambulatory Visit | Attending: Obstetrics & Gynecology | Admitting: Obstetrics & Gynecology

## 2015-04-10 DIAGNOSIS — F329 Major depressive disorder, single episode, unspecified: Secondary | ICD-10-CM | POA: Diagnosis present

## 2015-04-10 DIAGNOSIS — F17210 Nicotine dependence, cigarettes, uncomplicated: Secondary | ICD-10-CM | POA: Insufficient documentation

## 2015-04-10 DIAGNOSIS — O4703 False labor before 37 completed weeks of gestation, third trimester: Secondary | ICD-10-CM | POA: Insufficient documentation

## 2015-04-10 DIAGNOSIS — F418 Other specified anxiety disorders: Secondary | ICD-10-CM | POA: Diagnosis not present

## 2015-04-10 DIAGNOSIS — O99343 Other mental disorders complicating pregnancy, third trimester: Secondary | ICD-10-CM | POA: Insufficient documentation

## 2015-04-10 DIAGNOSIS — O99330 Smoking (tobacco) complicating pregnancy, unspecified trimester: Secondary | ICD-10-CM | POA: Diagnosis present

## 2015-04-10 DIAGNOSIS — O99333 Smoking (tobacco) complicating pregnancy, third trimester: Secondary | ICD-10-CM | POA: Diagnosis not present

## 2015-04-10 DIAGNOSIS — Z3A34 34 weeks gestation of pregnancy: Secondary | ICD-10-CM | POA: Diagnosis not present

## 2015-04-10 DIAGNOSIS — F419 Anxiety disorder, unspecified: Secondary | ICD-10-CM | POA: Diagnosis not present

## 2015-04-10 DIAGNOSIS — Z87891 Personal history of nicotine dependence: Secondary | ICD-10-CM | POA: Diagnosis present

## 2015-04-10 DIAGNOSIS — Z720 Tobacco use: Secondary | ICD-10-CM | POA: Diagnosis present

## 2015-04-10 DIAGNOSIS — O4700 False labor before 37 completed weeks of gestation, unspecified trimester: Secondary | ICD-10-CM | POA: Diagnosis present

## 2015-04-10 DIAGNOSIS — F17200 Nicotine dependence, unspecified, uncomplicated: Secondary | ICD-10-CM | POA: Diagnosis present

## 2015-04-10 LAB — WET PREP, GENITAL
CLUE CELLS WET PREP: NONE SEEN
TRICH WET PREP: NONE SEEN
Yeast Wet Prep HPF POC: NONE SEEN

## 2015-04-10 LAB — URINE MICROSCOPIC-ADD ON

## 2015-04-10 LAB — CULTURE, BETA STREP (GROUP B ONLY)

## 2015-04-10 LAB — URINALYSIS, ROUTINE W REFLEX MICROSCOPIC
Bilirubin Urine: NEGATIVE
Glucose, UA: 250 mg/dL — AB
Hgb urine dipstick: NEGATIVE
Ketones, ur: NEGATIVE mg/dL
Nitrite: NEGATIVE
PROTEIN: 30 mg/dL — AB
Specific Gravity, Urine: >1.03 — ABNORMAL HIGH (ref 1.005–1.030)
Urobilinogen, UA: 0.2 mg/dL (ref 0.0–1.0)
pH: 5.5 (ref 5.0–8.0)

## 2015-04-10 MED ORDER — HYDROXYZINE HCL 25 MG PO TABS: 25.0000 mg | ORAL_TABLET | Freq: Four times a day (QID) | ORAL | Status: DC | PRN

## 2015-04-10 MED ORDER — ZOLPIDEM TARTRATE 5 MG PO TABS: 2.5000 mg | ORAL_TABLET | Freq: Every evening | ORAL | Status: DC | PRN

## 2015-04-10 NOTE — MAU Note (Signed)
Urine in lab 

## 2015-04-10 NOTE — MAU Note (Signed)
Pt c/o ctx on and off all afternoon. Denies leaking or bleeding and reports fetal movement a little less than usual.

## 2015-04-10 NOTE — MAU Provider Note (Signed)
History     CSN: [id number]  Arrival date and time: 04/10/15 1733   None     Chief Complaint  Patient presents with  . Contractions   HPI Patient is 34 y.o. [id number] [redacted]w[redacted]d here with complaints of contractions. She has been seen for this complaint and presents for continued/persistent contractions. She reports "not being able to sleep all night" but her Ctx did not start until 0500. She reports they are intermittent and will not quantify the frequency and only answers "they happen all the time time"  She reports being 3 cm dilated in clinic. She endorses being "worried" throughout the pregnancy and this is worsening. She and her partner are bickering throughout the exam.   +FM, denies LOF, VB,  vaginal discharge.  OB History  Gravida Para Term Preterm AB SAB TAB Ectopic Multiple Living  4 3 3       3     # Outcome Date GA Lbr Len/2nd Weight Sex Delivery Anes PTL Lv  4 Current           3 Term 10/02/13 [redacted]w[redacted]d 137:29 / 00:16 7 lb 8.1 oz (3.405 kg) Genella Mech EPI  Y  2 Term 06/02/05     Vag-Spont   Y  1 Term 08/07/01 [redacted]w[redacted]d    Vag-Spont   Y       Past Medical History  Diagnosis Date  . Anxiety     stopped meds with pregnancy  . Tobacco abuse     Past Surgical History  Procedure Laterality Date  . Hernia repair      when 8 wks old  . Wisdom tooth extraction      Family History  Problem Relation Age of Onset  . Alcohol abuse Neg Hx   . Arthritis Neg Hx   . Asthma Neg Hx   . Birth defects Neg Hx   . Cancer Neg Hx   . COPD Neg Hx   . Depression Neg Hx   . Diabetes Neg Hx   . Drug abuse Neg Hx   . Early death Neg Hx   . Hearing loss Neg Hx   . Heart disease Neg Hx   . Hyperlipidemia Neg Hx   . Hypertension Neg Hx   . Kidney disease Neg Hx   . Learning disabilities Neg Hx   . Mental illness Neg Hx   . Mental retardation Neg Hx   . Miscarriages / Stillbirths Neg Hx   . Stroke Neg Hx   . Vision loss Neg Hx   . Varicose Veins Neg Hx     History  Substance Use  Topics  . Smoking status: Current Every Day Smoker -- 1.00 packs/day    Types: Cigarettes  . Smokeless tobacco: Never Used  . Alcohol Use: No    Allergies: No Known Allergies  No prescriptions prior to admission    Review of Systems  Constitutional: Negative for fever and chills.  Eyes: Negative for blurred vision and double vision.  Respiratory: Negative for cough and shortness of breath.   Cardiovascular: Negative for chest pain and orthopnea.  Gastrointestinal: Negative for nausea and vomiting.  Genitourinary: Negative for dysuria, frequency and flank pain.  Musculoskeletal: Negative for myalgias.  Skin: Negative for rash.  Neurological: Negative for dizziness, tingling, weakness and headaches.  Endo/Heme/Allergies: Does not bruise/bleed easily.  Psychiatric/Behavioral: Negative for depression and suicidal ideas. The patient is not nervous/anxious.    Physical Exam   Blood pressure 113/59, pulse 81, temperature 98.2 F (36.8  C), resp. rate 18, height 5' 1.5" (1.562 m), weight 156 lb 6.4 oz (70.943 kg), last menstrual period 08/19/2014, currently breastfeeding.  Physical Exam  Constitutional: She is oriented to person, place, and time. She appears well-developed and well-nourished. No distress.  Pregnant female  HENT:  Head: Normocephalic and atraumatic.  Eyes: Conjunctivae are normal. No scleral icterus.  Neck: Normal range of motion. Neck supple.  Cardiovascular: Normal rate and intact distal pulses.   Respiratory: Effort normal. She exhibits no tenderness.  GI: Soft. There is no tenderness. There is no rebound and no guarding.  Gravid  Genitourinary: Vagina normal.  Normal discharge. Appears closed but os is posterior.   Musculoskeletal: Normal range of motion. She exhibits no edema.  Neurological: She is alert and oriented to person, place, and time.  Skin: Skin is warm and dry. No rash noted.  Psychiatric: Her speech is normal and behavior is normal. Judgment and  thought content normal. Her mood appears anxious. Her affect is labile. Cognition and memory are normal.  Very anxious- worried about not sleeping but then reports she is worried about taking anything for sleep because "she might sleep through her 19mon old waking up" and when asked how often child wakes she says "never but he might" She is argumentative with provider and partner. She expresses anxiety about labor. She is tearful intermittently. She reports feeling safe.    Dilation: 3 Effacement (%): 50 Cervical Position: Posterior Station: -3 Presentation: Vertex Exam by:: Dr. Alvester Morin  MAU Course  Procedures  MDM NST 130/mod/+accels/no decels UA -negative Wet prep- +WBC, otherwise neg  Assessment and Plan  Marrian Bells is a 34 y.o. [id number] at [redacted]w[redacted]d presenting with contractions  #Threatened PTL: stable cervical exam. Not need for tocolysis at this point as she is >34 week. Return home with PTL precuations #FWB- s/p betamethasone on 8/3 and 8/4. NST reactive #Anxiety: Patient is tearful and very anxious. Previously on clonipin before pregnancy. Provided ambien for sleep and atarax for anxiety/sleep. She will need pp mood check as I feel she is at high risk for peripartum mood disturbance based on her exam today.   Isa Rankin [facility] 04/10/2015, 9:49 PM

## 2015-04-13 ENCOUNTER — Ambulatory Visit (INDEPENDENT_AMBULATORY_CARE_PROVIDER_SITE_OTHER): Payer: Medicaid Other | Admitting: Obstetrics & Gynecology

## 2015-04-13 ENCOUNTER — Encounter: Payer: Self-pay | Admitting: Obstetrics & Gynecology

## 2015-04-13 VITALS — BP 106/69 | HR 103 | Wt 159.0 lb

## 2015-04-13 DIAGNOSIS — O4703 False labor before 37 completed weeks of gestation, third trimester: Secondary | ICD-10-CM

## 2015-04-13 DIAGNOSIS — O99333 Smoking (tobacco) complicating pregnancy, third trimester: Secondary | ICD-10-CM

## 2015-04-13 DIAGNOSIS — Z3483 Encounter for supervision of other normal pregnancy, third trimester: Secondary | ICD-10-CM

## 2015-04-13 NOTE — Patient Instructions (Signed)

## 2015-04-13 NOTE — Progress Notes (Signed)
Subjective:  Heather [name] is a 34 y.o. [phone] at [redacted]w[redacted]d being seen today for ongoing prenatal care.  Patient reports freq contractions.  Seen in the hosp and received BMZ..  Contractions: Irregular.  Vag. Bleeding: None. Movement: Present. Denies leaking of fluid.   The following portions of the patient's history were reviewed and updated as appropriate: allergies, current medications, past family history, past medical history, past social history, past surgical history and problem list.   Objective:   Filed Vitals:   04/13/15 1345  BP: 106/69  Pulse: 103  Weight: 159 lb (72.122 kg)    Fetal Status: Fetal Heart Rate (bpm): 159 Fundal Height: 32 cm Movement: Present  Presentation: Vertex  General:  Alert, oriented and cooperative. Patient is in no acute distress.  Skin: Skin is warm and dry. No rash noted.   Cardiovascular: Normal heart rate noted  Respiratory: Normal respiratory effort, no problems with respiration noted  Abdomen: Soft, gravid, appropriate for gestational age. Pain/Pressure: Absent     Pelvic: Vag. Bleeding: None Vag D/C Character: Thick   Cervical exam performed Dilation: 4 Effacement (%): 40 Station: -3  Extremities: Normal range of motion.  Edema: None  Mental Status: Normal mood and affect. Normal behavior. Normal judgment and thought content.   Urinalysis: Urine Protein: 1+ Urine Glucose: 2+  Assessment and Plan:  Pregnancy: G4P3003 at [redacted]w[redacted]d  1. Encounter for supervision of other normal pregnancy in third trimester   2. Threatened preterm labor, third trimester Reviewed with pt reason for BMZ Reviewed PTL sx  3. Tobacco use in pregnancy, antepartum, third trimester   Preterm labor symptoms and general obstetric precautions including but not limited to vaginal bleeding, contractions, leaking of fluid and fetal movement were reviewed in detail with the patient. Please refer to After Visit Summary for other counseling recommendations.  Return in about 1  week (around 04/20/2015).   Willodean Rosenthal, MD

## 2015-04-20 ENCOUNTER — Inpatient Hospital Stay (HOSPITAL_COMMUNITY)
Admission: AD | Admit: 2015-04-20 | Discharge: 2015-04-20 | Disposition: A | Discharge status: Home / Self Care | Payer: Medicaid Other | Source: Ambulatory Visit | Attending: Obstetrics & Gynecology | Admitting: Obstetrics & Gynecology

## 2015-04-20 ENCOUNTER — Ambulatory Visit (INDEPENDENT_AMBULATORY_CARE_PROVIDER_SITE_OTHER): Payer: Medicaid Other | Admitting: Family Medicine

## 2015-04-20 ENCOUNTER — Encounter (HOSPITAL_COMMUNITY): Payer: Self-pay | Admitting: *Deleted

## 2015-04-20 VITALS — BP 110/68 | HR 103 | Wt 155.0 lb

## 2015-04-20 DIAGNOSIS — O99891 Other specified diseases and conditions complicating pregnancy: Secondary | ICD-10-CM

## 2015-04-20 DIAGNOSIS — O4703 False labor before 37 completed weeks of gestation, third trimester: Secondary | ICD-10-CM

## 2015-04-20 DIAGNOSIS — M549 Dorsalgia, unspecified: Secondary | ICD-10-CM | POA: Diagnosis present

## 2015-04-20 DIAGNOSIS — Z3A36 36 weeks gestation of pregnancy: Secondary | ICD-10-CM | POA: Insufficient documentation

## 2015-04-20 DIAGNOSIS — Z3483 Encounter for supervision of other normal pregnancy, third trimester: Secondary | ICD-10-CM

## 2015-04-20 DIAGNOSIS — O9989 Other specified diseases and conditions complicating pregnancy, childbirth and the puerperium: Secondary | ICD-10-CM

## 2015-04-20 MED ORDER — CYCLOBENZAPRINE HCL 7.5 MG PO TABS: 7.5000 mg | ORAL_TABLET | Freq: Once | ORAL | Status: DC

## 2015-04-20 MED ORDER — CYCLOBENZAPRINE HCL 5 MG PO TABS
7.5000 mg | ORAL_TABLET | Freq: Once | ORAL | Status: AC
  Administered 2015-04-20: 7.5 mg via ORAL
  Filled 2015-04-20: qty 2

## 2015-04-20 NOTE — Progress Notes (Signed)
Had pelvic exam last night at hospital, c/o scant bloody tinged d/c.

## 2015-04-20 NOTE — MAU Note (Signed)
Pt c/o lower back pain that began an hour ago-cannot tell if she is having contractions. Denies LOF or vag bleeding. Was 4cm on Monday.

## 2015-04-20 NOTE — Progress Notes (Signed)
Subjective:  Heather [name] is a 34 y.o. [phone] at [redacted]w[redacted]d being seen today for ongoing prenatal care.  Patient reports backache.  Contractions: Irregular.  Vag. Bleeding: Scant. Movement: Present. Denies leaking of fluid.   The following portions of the patient's history were reviewed and updated as appropriate: allergies, current medications, past family history, past medical history, past social history, past surgical history and problem list.   Objective:   Filed Vitals:   04/20/15 1610  BP: 110/68  Pulse: 103  Weight: 155 lb (70.308 kg)    Fetal Status:   Fundal Height: 31 cm Movement: Present  Presentation: Vertex  General:  Alert, oriented and cooperative. Patient is in no acute distress.  Skin: Skin is warm and dry. No rash noted.   Cardiovascular: Normal heart rate noted  Respiratory: Normal respiratory effort, no problems with respiration noted  Abdomen: Soft, gravid, appropriate for gestational age. Pain/Pressure: Present     Pelvic: Vag. Bleeding: Scant Vag D/C Character: Thick   Cervical exam performed Dilation: 4.5 Effacement (%): 40 Station: -3  Extremities: Normal range of motion.  Edema: Trace  Mental Status: Normal mood and affect. Normal behavior. Normal judgment and thought content.    Assessment and Plan:  Pregnancy: G4P3003 at [redacted]w[redacted]d  1. Encounter for supervision of other normal pregnancy in third trimester Continue routine prenatal care.   2. Threatened preterm labor, third trimester Not obviously in labor right now. S/p BMZ. Flexeril prn.  Term labor symptoms and general obstetric precautions including but not limited to vaginal bleeding, contractions, leaking of fluid and fetal movement were reviewed in detail with the patient. Please refer to After Visit Summary for other counseling recommendations.  Return in 1 week (on 04/27/2015).   Reva Bores, MD

## 2015-04-20 NOTE — Patient Instructions (Signed)
Third Trimester of Pregnancy The third trimester is from week 29 through week 42, months 7 through 9. The third trimester is a time when the fetus is growing rapidly. At the end of the ninth month, the fetus is about 20 inches in length and weighs 6-10 pounds.  BODY CHANGES Your body goes through many changes during pregnancy. The changes vary from woman to woman.   Your weight will continue to increase. You can expect to gain 25-35 pounds (11-16 kg) by the end of the pregnancy.  You may begin to get stretch marks on your hips, abdomen, and breasts.  You may urinate more often because the fetus is moving lower into your pelvis and pressing on your bladder.  You may develop or continue to have heartburn as a result of your pregnancy.  You may develop constipation because certain hormones are causing the muscles that push waste through your intestines to slow down.  You may develop hemorrhoids or swollen, bulging veins (varicose veins).  You may have pelvic pain because of the weight gain and pregnancy hormones relaxing your joints between the bones in your pelvis. Backaches may result from overexertion of the muscles supporting your posture.  You may have changes in your hair. These can include thickening of your hair, rapid growth, and changes in texture. Some women also have hair loss during or after pregnancy, or hair that feels dry or thin. Your hair will most likely return to normal after your baby is born.  Your breasts will continue to grow and be tender. A yellow discharge may leak from your breasts called colostrum.  Your belly button may stick out.  You may feel short of breath because of your expanding uterus.  You may notice the fetus "dropping," or moving lower in your abdomen.  You may have a bloody mucus discharge. This usually occurs a few days to a week before labor begins.  Your cervix becomes thin and soft (effaced) near your due date. WHAT TO EXPECT AT YOUR  PRENATAL EXAMS  You will have prenatal exams every 2 weeks until week 36. Then, you will have weekly prenatal exams. During a routine prenatal visit:  You will be weighed to make sure you and the fetus are growing normally.  Your blood pressure is taken.  Your abdomen will be measured to track your baby's growth.  The fetal heartbeat will be listened to.  Any test results from the previous visit will be discussed.  You may have a cervical check near your due date to see if you have effaced. At around 36 weeks, your caregiver will check your cervix. At the same time, your caregiver will also perform a test on the secretions of the vaginal tissue. This test is to determine if a type of bacteria, Group B streptococcus, is present. Your caregiver will explain this further. Your caregiver may ask you:  What your birth plan is.  How you are feeling.  If you are feeling the baby move.  If you have had any abnormal symptoms, such as leaking fluid, bleeding, severe headaches, or abdominal cramping.  If you have any questions. Other tests or screenings that may be performed during your third trimester include:  Blood tests that check for low iron levels (anemia).  Fetal testing to check the health, activity level, and growth of the fetus. Testing is done if you have certain medical conditions or if there are problems during the pregnancy. FALSE LABOR You may feel small, irregular contractions that   eventually go away. These are called Braxton Hicks contractions, or false labor. Contractions may last for hours, days, or even weeks before true labor sets in. If contractions come at regular intervals, intensify, or become painful, it is best to be seen by your caregiver.  SIGNS OF LABOR   Menstrual-like cramps.  Contractions that are 5 minutes apart or less.  Contractions that start on the top of the uterus and spread down to the lower abdomen and back.  A sense of increased pelvic  pressure or back pain.  A watery or bloody mucus discharge that comes from the vagina. If you have any of these signs before the 37th week of pregnancy, call your caregiver right away. You need to go to the hospital to get checked immediately. HOME CARE INSTRUCTIONS   Avoid all smoking, herbs, alcohol, and unprescribed drugs. These chemicals affect the formation and growth of the baby.  Follow your caregiver's instructions regarding medicine use. There are medicines that are either safe or unsafe to take during pregnancy.  Exercise only as directed by your caregiver. Experiencing uterine cramps is a good sign to stop exercising.  Continue to eat regular, healthy meals.  Wear a good support bra for breast tenderness.  Do not use hot tubs, steam rooms, or saunas.  Wear your seat belt at all times when driving.  Avoid raw meat, uncooked cheese, cat litter boxes, and soil used by cats. These carry germs that can cause birth defects in the baby.  Take your prenatal vitamins.  Try taking a stool softener (if your caregiver approves) if you develop constipation. Eat more high-fiber foods, such as fresh vegetables or fruit and whole grains. Drink plenty of fluids to keep your urine clear or pale yellow.  Take warm sitz baths to soothe any pain or discomfort caused by hemorrhoids. Use hemorrhoid cream if your caregiver approves.  If you develop varicose veins, wear support hose. Elevate your feet for 15 minutes, 3-4 times a day. Limit salt in your diet.  Avoid heavy lifting, wear low heal shoes, and practice good posture.  Rest a lot with your legs elevated if you have leg cramps or low back pain.  Visit your dentist if you have not gone during your pregnancy. Use a soft toothbrush to brush your teeth and be gentle when you floss.  A sexual relationship may be continued unless your caregiver directs you otherwise.  Do not travel far distances unless it is absolutely necessary and only  with the approval of your caregiver.  Take prenatal classes to understand, practice, and ask questions about the labor and delivery.  Make a trial run to the hospital.  Pack your hospital bag.  Prepare the baby's nursery.  Continue to go to all your prenatal visits as directed by your caregiver. SEEK MEDICAL CARE IF:  You are unsure if you are in labor or if your water has broken.  You have dizziness.  You have mild pelvic cramps, pelvic pressure, or nagging pain in your abdominal area.  You have persistent nausea, vomiting, or diarrhea.  You have a bad smelling vaginal discharge.  You have pain with urination. SEEK IMMEDIATE MEDICAL CARE IF:   You have a fever.  You are leaking fluid from your vagina.  You have spotting or bleeding from your vagina.  You have severe abdominal cramping or pain.  You have rapid weight loss or gain.  You have shortness of breath with chest pain.  You notice sudden or extreme swelling   of your face, hands, ankles, feet, or legs.  You have not felt your baby move in over an hour.  You have severe headaches that do not go away with medicine.  You have vision changes. Document Released: 08/16/2001 Document Revised: 08/27/2013 Document Reviewed: 10/23/2012 [employer] Patient Information 2015 [employer]. This information is not intended to replace advice given to you by your health care provider. Make sure you discuss any questions you have with your health care provider.  Breastfeeding Deciding to breastfeed is one of the best choices you can make for you and your baby. A change in hormones during pregnancy causes your breast tissue to grow and increases the number and size of your milk ducts. These hormones also allow proteins, sugars, and fats from your blood supply to make breast milk in your milk-producing glands. Hormones prevent breast milk from being released before your baby is born as well as prompt milk flow after birth. Once  breastfeeding has begun, thoughts of your baby, as well as his or her sucking or crying, can stimulate the release of milk from your milk-producing glands.  BENEFITS OF BREASTFEEDING For Your Baby  Your first milk (colostrum) helps your baby's digestive system function better.   There are antibodies in your milk that help your baby fight off infections.   Your baby has a lower incidence of asthma, allergies, and sudden infant death syndrome.   The nutrients in breast milk are better for your baby than infant formulas and are designed uniquely for your baby's needs.   Breast milk improves your baby's brain development.   Your baby is less likely to develop other conditions, such as childhood obesity, asthma, or type 2 diabetes mellitus.  For You   Breastfeeding helps to create a very special bond between you and your baby.   Breastfeeding is convenient. Breast milk is always available at the correct temperature and costs nothing.   Breastfeeding helps to burn calories and helps you lose the weight gained during pregnancy.   Breastfeeding makes your uterus contract to its prepregnancy size faster and slows bleeding (lochia) after you give birth.   Breastfeeding helps to lower your risk of developing type 2 diabetes mellitus, osteoporosis, and breast or ovarian cancer later in life. SIGNS THAT YOUR BABY IS HUNGRY Early Signs of Hunger  Increased alertness or activity.  Stretching.  Movement of the head from side to side.  Movement of the head and opening of the mouth when the corner of the mouth or cheek is stroked (rooting).  Increased sucking sounds, smacking lips, cooing, sighing, or squeaking.  Hand-to-mouth movements.  Increased sucking of fingers or hands. Late Signs of Hunger  Fussing.  Intermittent crying. Extreme Signs of Hunger Signs of extreme hunger will require calming and consoling before your baby will be able to breastfeed successfully. Do not  wait for the following signs of extreme hunger to occur before you initiate breastfeeding:   Restlessness.  A loud, strong cry.   Screaming. BREASTFEEDING BASICS Breastfeeding Initiation  Find a comfortable place to sit or lie down, with your neck and back well supported.  Place a pillow or rolled up blanket under your baby to bring him or her to the level of your breast (if you are seated). Nursing pillows are specially designed to help support your arms and your baby while you breastfeed.  Make sure that your baby's abdomen is facing your abdomen.   Gently massage your breast. With your fingertips, massage from your chest   wall toward your nipple in a circular motion. This encourages milk flow. You may need to continue this action during the feeding if your milk flows slowly.  Support your breast with 4 fingers underneath and your thumb above your nipple. Make sure your fingers are well away from your nipple and your baby's mouth.   Stroke your baby's lips gently with your finger or nipple.   When your baby's mouth is open wide enough, quickly bring your baby to your breast, placing your entire nipple and as much of the colored area around your nipple (areola) as possible into your baby's mouth.   More areola should be visible above your baby's upper lip than below the lower lip.   Your baby's tongue should be between his or her lower gum and your breast.   Ensure that your baby's mouth is correctly positioned around your nipple (latched). Your baby's lips should create a seal on your breast and be turned out (everted).  It is common for your baby to suck about 2-3 minutes in order to start the flow of breast milk. Latching Teaching your baby how to latch on to your breast properly is very important. An improper latch can cause nipple pain and decreased milk supply for you and poor weight gain in your baby. Also, if your baby is not latched onto your nipple properly, he or she  may swallow some air during feeding. This can make your baby fussy. Burping your baby when you switch breasts during the feeding can help to get rid of the air. However, teaching your baby to latch on properly is still the best way to prevent fussiness from swallowing air while breastfeeding. Signs that your baby has successfully latched on to your nipple:    Silent tugging or silent sucking, without causing you pain.   Swallowing heard between every 3-4 sucks.    Muscle movement above and in front of his or her ears while sucking.  Signs that your baby has not successfully latched on to nipple:   Sucking sounds or smacking sounds from your baby while breastfeeding.  Nipple pain. If you think your baby has not latched on correctly, slip your finger into the corner of your baby's mouth to break the suction and place it between your baby's gums. Attempt breastfeeding initiation again. Signs of Successful Breastfeeding Signs from your baby:   A gradual decrease in the number of sucks or complete cessation of sucking.   Falling asleep.   Relaxation of his or her body.   Retention of a small amount of milk in his or her mouth.   Letting go of your breast by himself or herself. Signs from you:  Breasts that have increased in firmness, weight, and size 1-3 hours after feeding.   Breasts that are softer immediately after breastfeeding.  Increased milk volume, as well as a change in milk consistency and color by the fifth day of breastfeeding.   Nipples that are not sore, cracked, or bleeding. Signs That Your Baby is Getting Enough Milk  Wetting at least 3 diapers in a 24-hour period. The urine should be clear and pale yellow by age 5 days.  At least 3 stools in a 24-hour period by age 5 days. The stool should be soft and yellow.  At least 3 stools in a 24-hour period by age 7 days. The stool should be seedy and yellow.  No loss of weight greater than 10% of birth weight  during the first 3   days of age.  Average weight gain of 4-7 ounces (113-198 g) per week after age 4 days.  Consistent daily weight gain by age 5 days, without weight loss after the age of 2 weeks. After a feeding, your baby may spit up a small amount. This is common. BREASTFEEDING FREQUENCY AND DURATION Frequent feeding will help you make more milk and can prevent sore nipples and breast engorgement. Breastfeed when you feel the need to reduce the fullness of your breasts or when your baby shows signs of hunger. This is called "breastfeeding on demand." Avoid introducing a pacifier to your baby while you are working to establish breastfeeding (the first 4-6 weeks after your baby is born). After this time you may choose to use a pacifier. Research has shown that pacifier use during the first year of a baby's life decreases the risk of sudden infant death syndrome (SIDS). Allow your baby to feed on each breast as long as he or she wants. Breastfeed until your baby is finished feeding. When your baby unlatches or falls asleep while feeding from the first breast, offer the second breast. Because newborns are often sleepy in the first few weeks of life, you may need to awaken your baby to get him or her to feed. Breastfeeding times will vary from baby to baby. However, the following rules can serve as a guide to help you ensure that your baby is properly fed:  Newborns (babies 4 weeks of age or younger) may breastfeed every 1-3 hours.  Newborns should not go longer than 3 hours during the day or 5 hours during the night without breastfeeding.  You should breastfeed your baby a minimum of 8 times in a 24-hour period until you begin to introduce solid foods to your baby at around 6 months of age. BREAST MILK PUMPING Pumping and storing breast milk allows you to ensure that your baby is exclusively fed your breast milk, even at times when you are unable to breastfeed. This is especially important if you are  going back to work while you are still breastfeeding or when you are not able to be present during feedings. Your lactation consultant can give you guidelines on how long it is safe to store breast milk.  A breast pump is a machine that allows you to pump milk from your breast into a sterile bottle. The pumped breast milk can then be stored in a refrigerator or freezer. Some breast pumps are operated by hand, while others use electricity. Ask your lactation consultant which type will work best for you. Breast pumps can be purchased, but some hospitals and breastfeeding support groups lease breast pumps on a monthly basis. A lactation consultant can teach you how to hand express breast milk, if you prefer not to use a pump.  CARING FOR YOUR BREASTS WHILE YOU BREASTFEED Nipples can become dry, cracked, and sore while breastfeeding. The following recommendations can help keep your breasts moisturized and healthy:  Avoid using soap on your nipples.   Wear a supportive bra. Although not required, special nursing bras and tank tops are designed to allow access to your breasts for breastfeeding without taking off your entire bra or top. Avoid wearing underwire-style bras or extremely tight bras.  Air dry your nipples for 3-4minutes after each feeding.   Use only cotton bra pads to absorb leaked breast milk. Leaking of breast milk between feedings is normal.   Use lanolin on your nipples after breastfeeding. Lanolin helps to maintain your skin's   normal moisture barrier. If you use pure lanolin, you do not need to wash it off before feeding your baby again. Pure lanolin is not toxic to your baby. You may also hand express a few drops of breast milk and gently massage that milk into your nipples and allow the milk to air dry. In the first few weeks after giving birth, some women experience extremely full breasts (engorgement). Engorgement can make your breasts feel heavy, warm, and tender to the touch.  Engorgement peaks within 3-5 days after you give birth. The following recommendations can help ease engorgement:  Completely empty your breasts while breastfeeding or pumping. You may want to start by applying warm, moist heat (in the shower or with warm water-soaked hand towels) just before feeding or pumping. This increases circulation and helps the milk flow. If your baby does not completely empty your breasts while breastfeeding, pump any extra milk after he or she is finished.  Wear a snug bra (nursing or regular) or tank top for 1-2 days to signal your body to slightly decrease milk production.  Apply ice packs to your breasts, unless this is too uncomfortable for you.  Make sure that your baby is latched on and positioned properly while breastfeeding. If engorgement persists after 48 hours of following these recommendations, contact your health care provider or a lactation consultant. OVERALL HEALTH CARE RECOMMENDATIONS WHILE BREASTFEEDING  Eat healthy foods. Alternate between meals and snacks, eating 3 of each per day. Because what you eat affects your breast milk, some of the foods may make your baby more irritable than usual. Avoid eating these foods if you are sure that they are negatively affecting your baby.  Drink milk, fruit juice, and water to satisfy your thirst (about 10 glasses a day).   Rest often, relax, and continue to take your prenatal vitamins to prevent fatigue, stress, and anemia.  Continue breast self-awareness checks.  Avoid chewing and smoking tobacco.  Avoid alcohol and drug use. Some medicines that may be harmful to your baby can pass through breast milk. It is important to ask your health care provider before taking any medicine, including all over-the-counter and prescription medicine as well as vitamin and herbal supplements. It is possible to become pregnant while breastfeeding. If birth control is desired, ask your health care provider about options that  will be safe for your baby. SEEK MEDICAL CARE IF:   You feel like you want to stop breastfeeding or have become frustrated with breastfeeding.  You have painful breasts or nipples.  Your nipples are cracked or bleeding.  Your breasts are red, tender, or warm.  You have a swollen area on either breast.  You have a fever or chills.  You have nausea or vomiting.  You have drainage other than breast milk from your nipples.  Your breasts do not become full before feedings by the fifth day after you give birth.  You feel sad and depressed.  Your baby is too sleepy to eat well.  Your baby is having trouble sleeping.   Your baby is wetting less than 3 diapers in a 24-hour period.  Your baby has less than 3 stools in a 24-hour period.  Your baby's skin or the white part of his or her eyes becomes yellow.   Your baby is not gaining weight by 5 days of age. SEEK IMMEDIATE MEDICAL CARE IF:   Your baby is overly tired (lethargic) and does not want to wake up and feed.  Your baby   develops an unexplained fever. Document Released: 08/22/2005 Document Revised: 08/27/2013 Document Reviewed: 02/13/2013 [employer] Patient Information 2015 [employer]. This information is not intended to replace advice given to you by your health care provider. Make sure you discuss any questions you have with your health care provider.  

## 2015-04-20 NOTE — MAU Provider Note (Signed)
History     CSN: [id number]  Arrival date and time: 04/20/15 0050   None     No chief complaint on file.  HPI  Patient is 34 y.o. [id number] [redacted]w[redacted]d here with complaints of back pain. Back pain began 1 hour ago. Located in the lower back across the midline. Concerned that the back pain may be contractions. She reports being 4cm dilated at her last clinic visit. Has had hx of threatened PTL multiple times throughout her pregnancy.   +FM, denies LOF, VB, vaginal discharge.    OB History    Gravida Para Term Preterm AB TAB SAB Ectopic Multiple Living   Past Medical History  Diagnosis Date  . Anxiety     stopped meds with pregnancy  . Tobacco abuse     Past Surgical History  Procedure Laterality Date  . Hernia repair      when 8 wks old  . Wisdom tooth extraction      Family History  Problem Relation Age of Onset  . Alcohol abuse Neg Hx   . Arthritis Neg Hx   . Asthma Neg Hx   . Birth defects Neg Hx   . Cancer Neg Hx   . COPD Neg Hx   . Depression Neg Hx   . Diabetes Neg Hx   . Drug abuse Neg Hx   . Early death Neg Hx   . Hearing loss Neg Hx   . Heart disease Neg Hx   . Hyperlipidemia Neg Hx   . Hypertension Neg Hx   . Kidney disease Neg Hx   . Learning disabilities Neg Hx   . Mental illness Neg Hx   . Mental retardation Neg Hx   . Miscarriages / Stillbirths Neg Hx   . Stroke Neg Hx   . Vision loss Neg Hx   . Varicose Veins Neg Hx     Social History  Substance Use Topics  . Smoking status: Current Every Day Smoker -- 1.00 packs/day    Types: Cigarettes  . Smokeless tobacco: Never Used  . Alcohol Use: No    Allergies: No Known Allergies  Prescriptions prior to admission  Medication Sig Dispense Refill Last Dose  . acetaminophen (TYLENOL) 500 MG tablet Take 500 mg by mouth every 6 (six) hours as needed for mild pain.   Taking  . hydrOXYzine (ATARAX/VISTARIL) 25 MG tablet Take 1 tablet (25 mg total) by mouth every 6 (six) hours as  needed for anxiety. (Patient not taking: Reported on 04/13/2015) 30 tablet 1 Not Taking  . Prenat w/o A Vit-FeFum-FePo-FA (CONCEPT OB) 130-92.4-1 MG CAPS Take 1 capsule by mouth daily. (Patient not taking: Reported on 04/10/2015) 30 capsule 11 Not Taking  . zolpidem (AMBIEN) 5 MG tablet Take 0.5 tablets (2.5 mg total) by mouth at bedtime as needed for sleep. You may take an additional 1/2 tablet if needed (Patient not taking: Reported on 04/13/2015) 3 tablet 0 Not Taking    Review of Systems  Constitutional: Negative for fever, chills and malaise/fatigue.  HENT: Negative for congestion.   Eyes: Negative for blurred vision and double vision.  Respiratory: Negative for cough and shortness of breath.   Cardiovascular: Negative for chest pain, palpitations, claudication and leg swelling.  Gastrointestinal: Negative for heartburn, nausea, vomiting, abdominal pain, diarrhea and constipation.  Genitourinary: Negative for dysuria and hematuria.  Musculoskeletal: Positive for back pain. Negative for myalgias.  Skin: Negative  for itching and rash.  Neurological: Negative for dizziness, loss of consciousness and headaches.   Physical Exam   Blood pressure 117/63, pulse 96, temperature 97.7 F (36.5 C), temperature source Oral, resp. rate 18, height 5\' 2"  (1.575 m), weight 72.122 kg (159 lb), last menstrual period 08/19/2014, SpO2 100 %, currently breastfeeding.  Physical Exam  Constitutional: She is oriented to person, place, and time. She appears well-developed and well-nourished. No distress.  HENT:  Head: Normocephalic and atraumatic.  Eyes: Conjunctivae and EOM are normal.  Neck: Normal range of motion. No thyromegaly present.  Cardiovascular: Normal rate, regular rhythm and normal heart sounds.  Exam reveals no gallop and no friction rub.   No murmur heard. Respiratory: Breath sounds normal. No respiratory distress. She has no wheezes. She has no rales.  GI: Soft. Bowel sounds are normal. She  exhibits no distension. There is no tenderness.  Gravid   Musculoskeletal: Normal range of motion. She exhibits no edema.  Neurological: She is alert and oriented to person, place, and time.  Skin: Skin is warm and dry. No rash noted. No erythema.  Psychiatric: She has a normal mood and affect. Her behavior is normal.   Cervix: 5/50/-3   FHR: baseline 125, good variability, +15x15 accels, no decels Toco: very irregular with uterine irritability   MAU Course  Procedures  MDM NST- reviewed and reactive  Cervical exam repeated x3 by myself and RN over the course of 5 hours with no change. Patient ambulated x2 hours prior to last cervical exam.   Assessment and Plan  Patient is 34 y.o. [id number] [redacted]w[redacted]d reporting back pain/contractions.  #Threated PTL: stable cervical change. Not needing tocolysis d/t >34 week. PTL precautions given #FWB- Cat I NST; s/p bmz on 8/3 &8/4 #Back Pain: Flexeril 1 dose given up discharge; patient does not desire Rx due to needing to be able to take care of her son   Stable for discharge to home   De Hollingshead 04/20/2015, 5:18 AM

## 2015-04-20 NOTE — Discharge Instructions (Signed)
Call the clinic or go to [facility] if: °· You begin to have strong, frequent contractions °· Your water breaks.  Sometimes it is a big gush of fluid, sometimes it is just a trickle that keeps getting your panties wet or running down your legs °· You have vaginal bleeding.  It is normal to have a small amount of spotting if your cervix was checked.  °· You don't feel your baby moving like normal.  If you don't, get you something to eat and drink and lay down and focus on feeling your baby move.  You should feel at least 10 movements in 2 hours.  If you don't, you should call the office or go to [facility].  °

## 2015-04-27 ENCOUNTER — Ambulatory Visit (INDEPENDENT_AMBULATORY_CARE_PROVIDER_SITE_OTHER): Payer: Medicaid Other | Admitting: Family Medicine

## 2015-04-27 VITALS — BP 108/72 | HR 116 | Wt 155.0 lb

## 2015-04-27 DIAGNOSIS — R300 Dysuria: Principal | ICD-10-CM

## 2015-04-27 DIAGNOSIS — Z3483 Encounter for supervision of other normal pregnancy, third trimester: Secondary | ICD-10-CM

## 2015-04-27 DIAGNOSIS — Z113 Encounter for screening for infections with a predominantly sexual mode of transmission: Secondary | ICD-10-CM

## 2015-04-27 DIAGNOSIS — R309 Painful micturition, unspecified: Secondary | ICD-10-CM

## 2015-04-27 DIAGNOSIS — O26893 Other specified pregnancy related conditions, third trimester: Secondary | ICD-10-CM

## 2015-04-27 LAB — POCT URINALYSIS DIPSTICK
BILIRUBIN UA: NEGATIVE
Blood, UA: NEGATIVE
Glucose, UA: NEGATIVE
KETONES UA: NEGATIVE
Nitrite, UA: NEGATIVE
Protein, UA: NEGATIVE
Spec Grav, UA: 1.01
Urobilinogen, UA: NEGATIVE
pH, UA: 5

## 2015-04-27 NOTE — Progress Notes (Signed)
Has odor to her urine.

## 2015-04-27 NOTE — Patient Instructions (Signed)
Third Trimester of Pregnancy The third trimester is from week 29 through week 42, months 7 through 9. The third trimester is a time when the fetus is growing rapidly. At the end of the ninth month, the fetus is about 20 inches in length and weighs 6-10 pounds.  BODY CHANGES Your body goes through many changes during pregnancy. The changes vary from woman to woman.   Your weight will continue to increase. You can expect to gain 25-35 pounds (11-16 kg) by the end of the pregnancy.  You may begin to get stretch marks on your hips, abdomen, and breasts.  You may urinate more often because the fetus is moving lower into your pelvis and pressing on your bladder.  You may develop or continue to have heartburn as a result of your pregnancy.  You may develop constipation because certain hormones are causing the muscles that push waste through your intestines to slow down.  You may develop hemorrhoids or swollen, bulging veins (varicose veins).  You may have pelvic pain because of the weight gain and pregnancy hormones relaxing your joints between the bones in your pelvis. Backaches may result from overexertion of the muscles supporting your posture.  You may have changes in your hair. These can include thickening of your hair, rapid growth, and changes in texture. Some women also have hair loss during or after pregnancy, or hair that feels dry or thin. Your hair will most likely return to normal after your baby is born.  Your breasts will continue to grow and be tender. A yellow discharge may leak from your breasts called colostrum.  Your belly button may stick out.  You may feel short of breath because of your expanding uterus.  You may notice the fetus "dropping," or moving lower in your abdomen.  You may have a bloody mucus discharge. This usually occurs a few days to a week before labor begins.  Your cervix becomes thin and soft (effaced) near your due date. WHAT TO EXPECT AT YOUR  PRENATAL EXAMS  You will have prenatal exams every 2 weeks until week 36. Then, you will have weekly prenatal exams. During a routine prenatal visit:  You will be weighed to make sure you and the fetus are growing normally.  Your blood pressure is taken.  Your abdomen will be measured to track your baby's growth.  The fetal heartbeat will be listened to.  Any test results from the previous visit will be discussed.  You may have a cervical check near your due date to see if you have effaced. At around 36 weeks, your caregiver will check your cervix. At the same time, your caregiver will also perform a test on the secretions of the vaginal tissue. This test is to determine if a type of bacteria, Group B streptococcus, is present. Your caregiver will explain this further. Your caregiver may ask you:  What your birth plan is.  How you are feeling.  If you are feeling the baby move.  If you have had any abnormal symptoms, such as leaking fluid, bleeding, severe headaches, or abdominal cramping.  If you have any questions. Other tests or screenings that may be performed during your third trimester include:  Blood tests that check for low iron levels (anemia).  Fetal testing to check the health, activity level, and growth of the fetus. Testing is done if you have certain medical conditions or if there are problems during the pregnancy. FALSE LABOR You may feel small, irregular contractions that   eventually go away. These are called Braxton Hicks contractions, or false labor. Contractions may last for hours, days, or even weeks before true labor sets in. If contractions come at regular intervals, intensify, or become painful, it is best to be seen by your caregiver.  SIGNS OF LABOR   Menstrual-like cramps.  Contractions that are 5 minutes apart or less.  Contractions that start on the top of the uterus and spread down to the lower abdomen and back.  A sense of increased pelvic  pressure or back pain.  A watery or bloody mucus discharge that comes from the vagina. If you have any of these signs before the 37th week of pregnancy, call your caregiver right away. You need to go to the hospital to get checked immediately. HOME CARE INSTRUCTIONS   Avoid all smoking, herbs, alcohol, and unprescribed drugs. These chemicals affect the formation and growth of the baby.  Follow your caregiver's instructions regarding medicine use. There are medicines that are either safe or unsafe to take during pregnancy.  Exercise only as directed by your caregiver. Experiencing uterine cramps is a good sign to stop exercising.  Continue to eat regular, healthy meals.  Wear a good support bra for breast tenderness.  Do not use hot tubs, steam rooms, or saunas.  Wear your seat belt at all times when driving.  Avoid raw meat, uncooked cheese, cat litter boxes, and soil used by cats. These carry germs that can cause birth defects in the baby.  Take your prenatal vitamins.  Try taking a stool softener (if your caregiver approves) if you develop constipation. Eat more high-fiber foods, such as fresh vegetables or fruit and whole grains. Drink plenty of fluids to keep your urine clear or pale yellow.  Take warm sitz baths to soothe any pain or discomfort caused by hemorrhoids. Use hemorrhoid cream if your caregiver approves.  If you develop varicose veins, wear support hose. Elevate your feet for 15 minutes, 3-4 times a day. Limit salt in your diet.  Avoid heavy lifting, wear low heal shoes, and practice good posture.  Rest a lot with your legs elevated if you have leg cramps or low back pain.  Visit your dentist if you have not gone during your pregnancy. Use a soft toothbrush to brush your teeth and be gentle when you floss.  A sexual relationship may be continued unless your caregiver directs you otherwise.  Do not travel far distances unless it is absolutely necessary and only  with the approval of your caregiver.  Take prenatal classes to understand, practice, and ask questions about the labor and delivery.  Make a trial run to the hospital.  Pack your hospital bag.  Prepare the baby's nursery.  Continue to go to all your prenatal visits as directed by your caregiver. SEEK MEDICAL CARE IF:  You are unsure if you are in labor or if your water has broken.  You have dizziness.  You have mild pelvic cramps, pelvic pressure, or nagging pain in your abdominal area.  You have persistent nausea, vomiting, or diarrhea.  You have a bad smelling vaginal discharge.  You have pain with urination. SEEK IMMEDIATE MEDICAL CARE IF:   You have a fever.  You are leaking fluid from your vagina.  You have spotting or bleeding from your vagina.  You have severe abdominal cramping or pain.  You have rapid weight loss or gain.  You have shortness of breath with chest pain.  You notice sudden or extreme swelling   of your face, hands, ankles, feet, or legs.  You have not felt your baby move in over an hour.  You have severe headaches that do not go away with medicine.  You have vision changes. Document Released: 08/16/2001 Document Revised: 08/27/2013 Document Reviewed: 10/23/2012 [employer] Patient Information 2015 [employer]. This information is not intended to replace advice given to you by your health care provider. Make sure you discuss any questions you have with your health care provider.  Breastfeeding Deciding to breastfeed is one of the best choices you can make for you and your baby. A change in hormones during pregnancy causes your breast tissue to grow and increases the number and size of your milk ducts. These hormones also allow proteins, sugars, and fats from your blood supply to make breast milk in your milk-producing glands. Hormones prevent breast milk from being released before your baby is born as well as prompt milk flow after birth. Once  breastfeeding has begun, thoughts of your baby, as well as his or her sucking or crying, can stimulate the release of milk from your milk-producing glands.  BENEFITS OF BREASTFEEDING For Your Baby  Your first milk (colostrum) helps your baby's digestive system function better.   There are antibodies in your milk that help your baby fight off infections.   Your baby has a lower incidence of asthma, allergies, and sudden infant death syndrome.   The nutrients in breast milk are better for your baby than infant formulas and are designed uniquely for your baby's needs.   Breast milk improves your baby's brain development.   Your baby is less likely to develop other conditions, such as childhood obesity, asthma, or type 2 diabetes mellitus.  For You   Breastfeeding helps to create a very special bond between you and your baby.   Breastfeeding is convenient. Breast milk is always available at the correct temperature and costs nothing.   Breastfeeding helps to burn calories and helps you lose the weight gained during pregnancy.   Breastfeeding makes your uterus contract to its prepregnancy size faster and slows bleeding (lochia) after you give birth.   Breastfeeding helps to lower your risk of developing type 2 diabetes mellitus, osteoporosis, and breast or ovarian cancer later in life. SIGNS THAT YOUR BABY IS HUNGRY Early Signs of Hunger  Increased alertness or activity.  Stretching.  Movement of the head from side to side.  Movement of the head and opening of the mouth when the corner of the mouth or cheek is stroked (rooting).  Increased sucking sounds, smacking lips, cooing, sighing, or squeaking.  Hand-to-mouth movements.  Increased sucking of fingers or hands. Late Signs of Hunger  Fussing.  Intermittent crying. Extreme Signs of Hunger Signs of extreme hunger will require calming and consoling before your baby will be able to breastfeed successfully. Do not  wait for the following signs of extreme hunger to occur before you initiate breastfeeding:   Restlessness.  A loud, strong cry.   Screaming. BREASTFEEDING BASICS Breastfeeding Initiation  Find a comfortable place to sit or lie down, with your neck and back well supported.  Place a pillow or rolled up blanket under your baby to bring him or her to the level of your breast (if you are seated). Nursing pillows are specially designed to help support your arms and your baby while you breastfeed.  Make sure that your baby's abdomen is facing your abdomen.   Gently massage your breast. With your fingertips, massage from your chest   wall toward your nipple in a circular motion. This encourages milk flow. You may need to continue this action during the feeding if your milk flows slowly.  Support your breast with 4 fingers underneath and your thumb above your nipple. Make sure your fingers are well away from your nipple and your baby's mouth.   Stroke your baby's lips gently with your finger or nipple.   When your baby's mouth is open wide enough, quickly bring your baby to your breast, placing your entire nipple and as much of the colored area around your nipple (areola) as possible into your baby's mouth.   More areola should be visible above your baby's upper lip than below the lower lip.   Your baby's tongue should be between his or her lower gum and your breast.   Ensure that your baby's mouth is correctly positioned around your nipple (latched). Your baby's lips should create a seal on your breast and be turned out (everted).  It is common for your baby to suck about 2-3 minutes in order to start the flow of breast milk. Latching Teaching your baby how to latch on to your breast properly is very important. An improper latch can cause nipple pain and decreased milk supply for you and poor weight gain in your baby. Also, if your baby is not latched onto your nipple properly, he or she  may swallow some air during feeding. This can make your baby fussy. Burping your baby when you switch breasts during the feeding can help to get rid of the air. However, teaching your baby to latch on properly is still the best way to prevent fussiness from swallowing air while breastfeeding. Signs that your baby has successfully latched on to your nipple:    Silent tugging or silent sucking, without causing you pain.   Swallowing heard between every 3-4 sucks.    Muscle movement above and in front of his or her ears while sucking.  Signs that your baby has not successfully latched on to nipple:   Sucking sounds or smacking sounds from your baby while breastfeeding.  Nipple pain. If you think your baby has not latched on correctly, slip your finger into the corner of your baby's mouth to break the suction and place it between your baby's gums. Attempt breastfeeding initiation again. Signs of Successful Breastfeeding Signs from your baby:   A gradual decrease in the number of sucks or complete cessation of sucking.   Falling asleep.   Relaxation of his or her body.   Retention of a small amount of milk in his or her mouth.   Letting go of your breast by himself or herself. Signs from you:  Breasts that have increased in firmness, weight, and size 1-3 hours after feeding.   Breasts that are softer immediately after breastfeeding.  Increased milk volume, as well as a change in milk consistency and color by the fifth day of breastfeeding.   Nipples that are not sore, cracked, or bleeding. Signs That Your Baby is Getting Enough Milk  Wetting at least 3 diapers in a 24-hour period. The urine should be clear and pale yellow by age 5 days.  At least 3 stools in a 24-hour period by age 5 days. The stool should be soft and yellow.  At least 3 stools in a 24-hour period by age 7 days. The stool should be seedy and yellow.  No loss of weight greater than 10% of birth weight  during the first 3   days of age.  Average weight gain of 4-7 ounces (113-198 g) per week after age 4 days.  Consistent daily weight gain by age 5 days, without weight loss after the age of 2 weeks. After a feeding, your baby may spit up a small amount. This is common. BREASTFEEDING FREQUENCY AND DURATION Frequent feeding will help you make more milk and can prevent sore nipples and breast engorgement. Breastfeed when you feel the need to reduce the fullness of your breasts or when your baby shows signs of hunger. This is called "breastfeeding on demand." Avoid introducing a pacifier to your baby while you are working to establish breastfeeding (the first 4-6 weeks after your baby is born). After this time you may choose to use a pacifier. Research has shown that pacifier use during the first year of a baby's life decreases the risk of sudden infant death syndrome (SIDS). Allow your baby to feed on each breast as long as he or she wants. Breastfeed until your baby is finished feeding. When your baby unlatches or falls asleep while feeding from the first breast, offer the second breast. Because newborns are often sleepy in the first few weeks of life, you may need to awaken your baby to get him or her to feed. Breastfeeding times will vary from baby to baby. However, the following rules can serve as a guide to help you ensure that your baby is properly fed:  Newborns (babies 4 weeks of age or younger) may breastfeed every 1-3 hours.  Newborns should not go longer than 3 hours during the day or 5 hours during the night without breastfeeding.  You should breastfeed your baby a minimum of 8 times in a 24-hour period until you begin to introduce solid foods to your baby at around 6 months of age. BREAST MILK PUMPING Pumping and storing breast milk allows you to ensure that your baby is exclusively fed your breast milk, even at times when you are unable to breastfeed. This is especially important if you are  going back to work while you are still breastfeeding or when you are not able to be present during feedings. Your lactation consultant can give you guidelines on how long it is safe to store breast milk.  A breast pump is a machine that allows you to pump milk from your breast into a sterile bottle. The pumped breast milk can then be stored in a refrigerator or freezer. Some breast pumps are operated by hand, while others use electricity. Ask your lactation consultant which type will work best for you. Breast pumps can be purchased, but some hospitals and breastfeeding support groups lease breast pumps on a monthly basis. A lactation consultant can teach you how to hand express breast milk, if you prefer not to use a pump.  CARING FOR YOUR BREASTS WHILE YOU BREASTFEED Nipples can become dry, cracked, and sore while breastfeeding. The following recommendations can help keep your breasts moisturized and healthy:  Avoid using soap on your nipples.   Wear a supportive bra. Although not required, special nursing bras and tank tops are designed to allow access to your breasts for breastfeeding without taking off your entire bra or top. Avoid wearing underwire-style bras or extremely tight bras.  Air dry your nipples for 3-4minutes after each feeding.   Use only cotton bra pads to absorb leaked breast milk. Leaking of breast milk between feedings is normal.   Use lanolin on your nipples after breastfeeding. Lanolin helps to maintain your skin's   normal moisture barrier. If you use pure lanolin, you do not need to wash it off before feeding your baby again. Pure lanolin is not toxic to your baby. You may also hand express a few drops of breast milk and gently massage that milk into your nipples and allow the milk to air dry. In the first few weeks after giving birth, some women experience extremely full breasts (engorgement). Engorgement can make your breasts feel heavy, warm, and tender to the touch.  Engorgement peaks within 3-5 days after you give birth. The following recommendations can help ease engorgement:  Completely empty your breasts while breastfeeding or pumping. You may want to start by applying warm, moist heat (in the shower or with warm water-soaked hand towels) just before feeding or pumping. This increases circulation and helps the milk flow. If your baby does not completely empty your breasts while breastfeeding, pump any extra milk after he or she is finished.  Wear a snug bra (nursing or regular) or tank top for 1-2 days to signal your body to slightly decrease milk production.  Apply ice packs to your breasts, unless this is too uncomfortable for you.  Make sure that your baby is latched on and positioned properly while breastfeeding. If engorgement persists after 48 hours of following these recommendations, contact your health care provider or a lactation consultant. OVERALL HEALTH CARE RECOMMENDATIONS WHILE BREASTFEEDING  Eat healthy foods. Alternate between meals and snacks, eating 3 of each per day. Because what you eat affects your breast milk, some of the foods may make your baby more irritable than usual. Avoid eating these foods if you are sure that they are negatively affecting your baby.  Drink milk, fruit juice, and water to satisfy your thirst (about 10 glasses a day).   Rest often, relax, and continue to take your prenatal vitamins to prevent fatigue, stress, and anemia.  Continue breast self-awareness checks.  Avoid chewing and smoking tobacco.  Avoid alcohol and drug use. Some medicines that may be harmful to your baby can pass through breast milk. It is important to ask your health care provider before taking any medicine, including all over-the-counter and prescription medicine as well as vitamin and herbal supplements. It is possible to become pregnant while breastfeeding. If birth control is desired, ask your health care provider about options that  will be safe for your baby. SEEK MEDICAL CARE IF:   You feel like you want to stop breastfeeding or have become frustrated with breastfeeding.  You have painful breasts or nipples.  Your nipples are cracked or bleeding.  Your breasts are red, tender, or warm.  You have a swollen area on either breast.  You have a fever or chills.  You have nausea or vomiting.  You have drainage other than breast milk from your nipples.  Your breasts do not become full before feedings by the fifth day after you give birth.  You feel sad and depressed.  Your baby is too sleepy to eat well.  Your baby is having trouble sleeping.   Your baby is wetting less than 3 diapers in a 24-hour period.  Your baby has less than 3 stools in a 24-hour period.  Your baby's skin or the white part of his or her eyes becomes yellow.   Your baby is not gaining weight by 5 days of age. SEEK IMMEDIATE MEDICAL CARE IF:   Your baby is overly tired (lethargic) and does not want to wake up and feed.  Your baby   develops an unexplained fever. Document Released: 08/22/2005 Document Revised: 08/27/2013 Document Reviewed: 02/13/2013 [employer] Patient Information 2015 [employer]. This information is not intended to replace advice given to you by your health care provider. Make sure you discuss any questions you have with your health care provider.  

## 2015-04-27 NOTE — Progress Notes (Signed)
Subjective:  Heather [name] is a 34 y.o. [phone] at [redacted]w[redacted]d being seen today for ongoing prenatal care.  Patient reports backache, fatigue and occasional contractions.  Contractions: Irregular.  Vag. Bleeding: None. Movement: Present. Denies leaking of fluid.   The following portions of the patient's history were reviewed and updated as appropriate: allergies, current medications, past family history, past medical history, past social history, past surgical history and problem list.   Objective:   Filed Vitals:   04/27/15 1459  BP: 108/72  Pulse: 116  Weight: 155 lb (70.308 kg)    Fetal Status: Fetal Heart Rate (bpm): 146 Fundal Height: 33 cm Movement: Present  Presentation: Vertex  General:  Alert, oriented and cooperative. Patient is in no acute distress.  Skin: Skin is warm and dry. No rash noted.   Cardiovascular: Normal heart rate noted  Respiratory: Normal respiratory effort, no problems with respiration noted  Abdomen: Soft, gravid, appropriate for gestational age. Pain/Pressure: Present     Pelvic: Vag. Bleeding: None Vag D/C Character: Thin   Cervical exam performed Dilation: 4.5 Effacement (%): 40 Station: -3  Extremities: Normal range of motion.  Edema: None  Mental Status: Normal mood and affect. Normal behavior. Normal judgment and thought content.    Urinalysis    Component Value Date/Time   COLORURINE YELLOW 04/10/2015 1743   APPEARANCEUR CLEAR 04/10/2015 1743   LABSPEC >1.030* 04/10/2015 1743   PHURINE 5.5 04/10/2015 1743   GLUCOSEU 250* 04/10/2015 1743   HGBUR NEGATIVE 04/10/2015 1743   BILIRUBINUR NEG 04/27/2015 1509   BILIRUBINUR NEGATIVE 04/10/2015 1743   KETONESUR NEGATIVE 04/10/2015 1743   PROTEINUR NEG 04/27/2015 1509   PROTEINUR 30* 04/10/2015 1743   UROBILINOGEN negative 04/27/2015 1509   UROBILINOGEN 0.2 04/10/2015 1743   NITRITE NEG 04/27/2015 1509   NITRITE NEGATIVE 04/10/2015 1743   LEUKOCYTESUR moderate (2+)* 04/27/2015 1509       Assessment  and Plan:  Pregnancy: G4P3003 at [redacted]w[redacted]d  1. Dysuria during pregnancy, antepartum, third trimester  - POCT Urinalysis Dipstick Moderate leuks--will check urine culture.   2. Encounter for supervision of other normal pregnancy in third trimester Continue routine prenatal care.   Term labor symptoms and general obstetric precautions including but not limited to vaginal bleeding, contractions, leaking of fluid and fetal movement were reviewed in detail with the patient. Please refer to After Visit Summary for other counseling recommendations.  Return in 1 week (on 05/04/2015).   Reva Bores, MD

## 2015-04-30 ENCOUNTER — Ambulatory Visit (HOSPITAL_COMMUNITY): Admission: RE | Admit: 2015-04-30 | Payer: Medicaid Other | Source: Ambulatory Visit

## 2015-05-04 ENCOUNTER — Inpatient Hospital Stay (HOSPITAL_COMMUNITY)
Admission: AD | Admit: 2015-05-04 | Discharge: 2015-05-07 | DRG: 775 | Disposition: A | Discharge status: Home / Self Care | Payer: Medicaid Other | Source: Ambulatory Visit | Attending: Family Medicine | Admitting: Family Medicine

## 2015-05-04 ENCOUNTER — Ambulatory Visit (INDEPENDENT_AMBULATORY_CARE_PROVIDER_SITE_OTHER): Payer: Medicaid Other | Admitting: Obstetrics & Gynecology

## 2015-05-04 ENCOUNTER — Encounter (HOSPITAL_COMMUNITY): Payer: Self-pay

## 2015-05-04 ENCOUNTER — Inpatient Hospital Stay (HOSPITAL_COMMUNITY): Payer: Medicaid Other | Admitting: Anesthesiology

## 2015-05-04 VITALS — BP 105/72 | HR 73 | Wt 156.0 lb

## 2015-05-04 DIAGNOSIS — Z3483 Encounter for supervision of other normal pregnancy, third trimester: Secondary | ICD-10-CM

## 2015-05-04 DIAGNOSIS — F418 Other specified anxiety disorders: Secondary | ICD-10-CM | POA: Diagnosis present

## 2015-05-04 DIAGNOSIS — O3433 Maternal care for cervical incompetence, third trimester: Secondary | ICD-10-CM | POA: Diagnosis present

## 2015-05-04 DIAGNOSIS — O99344 Other mental disorders complicating childbirth: Secondary | ICD-10-CM | POA: Diagnosis present

## 2015-05-04 DIAGNOSIS — Z3A38 38 weeks gestation of pregnancy: Secondary | ICD-10-CM | POA: Diagnosis not present

## 2015-05-04 DIAGNOSIS — F17210 Nicotine dependence, cigarettes, uncomplicated: Secondary | ICD-10-CM | POA: Diagnosis present

## 2015-05-04 DIAGNOSIS — O99334 Smoking (tobacco) complicating childbirth: Secondary | ICD-10-CM | POA: Diagnosis present

## 2015-05-04 LAB — CBC
HCT: 32.7 % — ABNORMAL LOW (ref 36.0–46.0)
Hemoglobin: 11 g/dL — ABNORMAL LOW (ref 12.0–15.0)
MCH: 30 pg (ref 26.0–34.0)
MCHC: 33.6 g/dL (ref 30.0–36.0)
MCV: 89.1 fL (ref 78.0–100.0)
Platelets: 363 10*3/uL (ref 150–400)
RBC: 3.67 MIL/uL — ABNORMAL LOW (ref 3.87–5.11)
RDW: 14.2 % (ref 11.5–15.5)
WBC: 13.8 10*3/uL — AB (ref 4.0–10.5)

## 2015-05-04 LAB — TYPE AND SCREEN
ABO/RH(D): O POS
ANTIBODY SCREEN: NEGATIVE

## 2015-05-04 MED ORDER — FENTANYL 2.5 MCG/ML BUPIVACAINE 1/10 % EPIDURAL INFUSION (WH - ANES)
INTRAMUSCULAR | Status: DC | PRN
  Administered 2015-05-04: 14 mL/h via EPIDURAL

## 2015-05-04 MED ORDER — CYCLOBENZAPRINE HCL 5 MG PO TABS
7.5000 mg | ORAL_TABLET | Freq: Once | ORAL | Status: DC
Start: 2015-05-04 — End: 2015-05-05
  Filled 2015-05-04: qty 1.5

## 2015-05-04 MED ORDER — FENTANYL 2.5 MCG/ML BUPIVACAINE 1/10 % EPIDURAL INFUSION (WH - ANES)
14.0000 mL/h | INTRAMUSCULAR | Status: DC | PRN
  Administered 2015-05-04: 14 mL/h via EPIDURAL
  Filled 2015-05-04: qty 125

## 2015-05-04 MED ORDER — TERBUTALINE SULFATE 1 MG/ML IJ SOLN
0.2500 mg | Freq: Once | INTRAMUSCULAR | Status: DC | PRN
  Filled 2015-05-04: qty 1

## 2015-05-04 MED ORDER — OXYTOCIN BOLUS FROM INFUSION
500.0000 mL | INTRAVENOUS | Status: DC
  Administered 2015-05-05: 500 mL via INTRAVENOUS

## 2015-05-04 MED ORDER — OXYTOCIN 40 UNITS IN LACTATED RINGERS INFUSION - SIMPLE MED
62.5000 mL/h | INTRAVENOUS | Status: DC
Start: 2015-05-04 — End: 2015-05-05

## 2015-05-04 MED ORDER — CITRIC ACID-SODIUM CITRATE 334-500 MG/5ML PO SOLN: 30.0000 mL | ORAL | Status: DC | PRN

## 2015-05-04 MED ORDER — PHENYLEPHRINE 40 MCG/ML (10ML) SYRINGE FOR IV PUSH (FOR BLOOD PRESSURE SUPPORT)
80.0000 ug | PREFILLED_SYRINGE | INTRAVENOUS | Status: DC | PRN
  Filled 2015-05-04: qty 2

## 2015-05-04 MED ORDER — ACETAMINOPHEN 325 MG PO TABS
650.0000 mg | ORAL_TABLET | ORAL | Status: DC | PRN
  Administered 2015-05-04: 650 mg via ORAL
  Filled 2015-05-04: qty 2

## 2015-05-04 MED ORDER — DIPHENHYDRAMINE HCL 50 MG/ML IJ SOLN: 12.5000 mg | INTRAMUSCULAR | Status: DC | PRN

## 2015-05-04 MED ORDER — OXYTOCIN 40 UNITS IN LACTATED RINGERS INFUSION - SIMPLE MED
1.0000 m[IU]/min | INTRAVENOUS | Status: DC
  Filled 2015-05-04: qty 1000

## 2015-05-04 MED ORDER — ONDANSETRON HCL 4 MG/2ML IJ SOLN: 4.0000 mg | Freq: Four times a day (QID) | INTRAMUSCULAR | Status: DC | PRN

## 2015-05-04 MED ORDER — PHENYLEPHRINE 40 MCG/ML (10ML) SYRINGE FOR IV PUSH (FOR BLOOD PRESSURE SUPPORT)
PREFILLED_SYRINGE | INTRAVENOUS | Status: AC
  Filled 2015-05-04: qty 20

## 2015-05-04 MED ORDER — LACTATED RINGERS IV SOLN
INTRAVENOUS | Status: DC
  Administered 2015-05-04: 17:00:00 via INTRAVENOUS

## 2015-05-04 MED ORDER — LACTATED RINGERS IV SOLN
500.0000 mL | INTRAVENOUS | Status: DC | PRN
  Administered 2015-05-04: 500 mL via INTRAVENOUS

## 2015-05-04 MED ORDER — OXYTOCIN 40 UNITS IN LACTATED RINGERS INFUSION - SIMPLE MED
1.0000 m[IU]/min | INTRAVENOUS | Status: DC
  Administered 2015-05-04: 2 m[IU]/min via INTRAVENOUS

## 2015-05-04 MED ORDER — LIDOCAINE HCL (PF) 1 % IJ SOLN
INTRAMUSCULAR | Status: DC | PRN
  Administered 2015-05-04 (×2): 5 mL

## 2015-05-04 MED ORDER — EPHEDRINE 5 MG/ML INJ
10.0000 mg | INTRAVENOUS | Status: DC | PRN
  Filled 2015-05-04: qty 2

## 2015-05-04 MED ORDER — LIDOCAINE HCL (PF) 1 % IJ SOLN
30.0000 mL | INTRAMUSCULAR | Status: DC | PRN
Start: 2015-05-04 — End: 2015-05-05
  Filled 2015-05-04: qty 30

## 2015-05-04 MED ORDER — FENTANYL 2.5 MCG/ML BUPIVACAINE 1/10 % EPIDURAL INFUSION (WH - ANES): 14.0000 mL/h | INTRAMUSCULAR | Status: DC | PRN

## 2015-05-04 MED ORDER — OXYCODONE-ACETAMINOPHEN 5-325 MG PO TABS: 1.0000 | ORAL_TABLET | ORAL | Status: DC | PRN

## 2015-05-04 MED ORDER — OXYCODONE-ACETAMINOPHEN 5-325 MG PO TABS: 2.0000 | ORAL_TABLET | ORAL | Status: DC | PRN

## 2015-05-04 NOTE — Progress Notes (Signed)
Labor Progress Note  Heather [name] is a 34 y.o. [phone] at [redacted]w[redacted]d  admitted for dilated cervix with h/o rapid labor  S: Patient doing well. Wants ROM if possible.   O:  BP 93/44 mmHg  Pulse 80  Temp(Src) 98.1 F (36.7 C) (Oral)  Resp 18  Ht  (1.575 m)  Wt 156 lb (70.761 kg)  BMI 28.53 kg/m2  LMP 08/19/2014  FHT:  FHR: 130 bpm, variability: moderate,  accelerations:  Present,  decelerations:  Absent UC:   regular, every 3-4 minutes SVE:   Dilation: 4 Effacement (%): 50 Station: -2 Exam by:: Deal, RN AROM :45p: clear  Pitocin @ 2 mu/min  Labs: Lab Results  Component Value Date   WBC 13.8* 05/04/2015   HGB 11.0* 05/04/2015   HCT 32.7* 05/04/2015   MCV 89.1 05/04/2015   PLT 363 05/04/2015    Assessment / Plan: 34 y.o. G4P3003 [redacted]w[redacted]d in early labor Augmentation of labor, progressing well  Labor: Progressing on Pitocin, s/p AROM Fetal Wellbeing:  Category I Pain Control:  Epidural Anticipated MOD:  NSVD  Expectant management   Caryl Ada, DO 05/04/2015, 9:54 PM PGY-2, [facility]

## 2015-05-04 NOTE — Progress Notes (Signed)
Subjective:  Heather [name] is a 34 y.o. [phone] at [redacted]w[redacted]d being seen today for ongoing prenatal care.  Patient reports backache.  Contractions: Regular.  Vag. Bleeding: None. Movement: Present. Denies leaking of fluid.   The following portions of the patient's history were reviewed and updated as appropriate: allergies, current medications, past family history, past medical history, past social history, past surgical history and problem list.   Objective:   Filed Vitals:   05/04/15 1336  BP: 105/72  Pulse: 73  Weight: 156 lb (70.761 kg)    Fetal Status: Fetal Heart Rate (bpm): 140    Movement: Present  Presentation: Vertex  General:  Alert, oriented and cooperative. Patient is in no acute distress.  Skin: Skin is warm and dry. No rash noted.   Cardiovascular: Normal heart rate noted  Respiratory: Normal respiratory effort, no problems with respiration noted  Abdomen: Soft, gravid, appropriate for gestational age. Pain/Pressure: Present     Pelvic: Vag. Bleeding: None Vag D/C Character: Thin   Cervical exam deferred Dilation: 6 Effacement (%): 50 Station: -2  Extremities: Normal range of motion.  Edema: None  Mental Status: Normal mood and affect. Normal behavior. Normal judgment and thought content.   Urinalysis:      Assessment and Plan:  Pregnancy: G4P3003 at [redacted]w[redacted]d  1. Encounter for supervision of other normal pregnancy in third trimester I spoke with Dr. Adrian Blackwater who agrees that because she lives far away, has rapid labors, and has advanced cervical dilation, that she should be admitted.  Term labor symptoms and general obstetric precautions including but not limited to vaginal bleeding, contractions, leaking of fluid and fetal movement were reviewed in detail with the patient. Please refer to After Visit Summary for other counseling recommendations.  Return in about 6 weeks (around 06/15/2015).   Allie Bossier, MD

## 2015-05-04 NOTE — Anesthesia Procedure Notes (Signed)
Epidural Patient location during procedure: OB Start time: 05/04/2015 10:15 PM End time: 05/04/2015 10:30 PM  Staffing Anesthesiologist: Sebastian Ache  Preanesthetic Checklist Completed: patient identified, site marked, surgical consent, pre-op evaluation, timeout performed, IV checked, risks and benefits discussed and monitors and equipment checked  Epidural Patient position: sitting Prep: site prepped and draped and DuraPrep Patient monitoring: heart rate, continuous pulse ox and blood pressure Approach: midline Location: L3-L4 Injection technique: LOR air  Needle:  Needle type: Tuohy  Needle gauge: 17 G Needle length: 9 cm and 9 Needle insertion depth: 6 cm Catheter type: closed end flexible Catheter size: 20 Guage Catheter at skin depth: 14 cm Test dose: negative  Assessment Events: blood not aspirated, injection not painful, no injection resistance, negative IV test and no paresthesia  Additional Notes   Patient tolerated the insertion well without complications.Reason for block:procedure for pain

## 2015-05-04 NOTE — H&P (Signed)
LABOR AND DELIVERY ADMISSION HISTORY AND PHYSICAL NOTE  Heather [name] is a 34 y.o. female [phone] with IUP at [redacted]w[redacted]d by 8-week sono presenting directly admitted from clinic for dilated cervix and history rapid labors. Braxton hicks contractions recently, now with moderate intensity contractions since cervical exam earlier today. No bleeding, no leakage of fluid, positive fetal movement.    Prenatal History/Complications:  Past Medical History: Past Medical History  Diagnosis Date  . Anxiety     stopped meds with pregnancy  . Tobacco abuse     Past Surgical History: Past Surgical History  Procedure Laterality Date  . Hernia repair      when 8 wks old  . Wisdom tooth extraction      Obstetrical History: OB History    Gravida Para Term Preterm AB TAB SAB Ectopic Multiple Living   Social History: Social History   Social History  . Marital Status: Single    Spouse Name: N/A  . Number of Children: N/A  . Years of Education: N/A   Social History Main Topics  . Smoking status: Current Every Day Smoker -- 0.50 packs/day for 20 years    Types: Cigarettes  . Smokeless tobacco: Never Used  . Alcohol Use: No  . Drug Use: No  . Sexual Activity: Yes    Birth Control/ Protection: None   Other Topics Concern  . None   Social History Narrative    Family History: Family History  Problem Relation Age of Onset  . Alcohol abuse Neg Hx   . Arthritis Neg Hx   . Asthma Neg Hx   . Birth defects Neg Hx   . Cancer Neg Hx   . COPD Neg Hx   . Depression Neg Hx   . Diabetes Neg Hx   . Drug abuse Neg Hx   . Early death Neg Hx   . Hearing loss Neg Hx   . Heart disease Neg Hx   . Hyperlipidemia Neg Hx   . Hypertension Neg Hx   . Kidney disease Neg Hx   . Learning disabilities Neg Hx   . Mental illness Neg Hx   . Mental retardation Neg Hx   . Miscarriages / Stillbirths Neg Hx   . Stroke Neg Hx   . Vision loss Neg Hx   . Varicose Veins Neg Hx      Allergies: No Known Allergies  Prescriptions prior to admission  Medication Sig Dispense Refill Last Dose  . acetaminophen (TYLENOL) 325 MG tablet Take 325 mg by mouth every 6 (six) hours as needed for moderate pain or headache.   05/03/2015 at Unknown time  . cyclobenzaprine (FEXMID) 7.5 MG tablet Take 1 tablet (7.5 mg total) by mouth once. 30 tablet 0 Past Month at Unknown time     Review of Systems   All systems reviewed and negative except as stated in HPI  Blood pressure 114/67, pulse 94, temperature 98.7 F (37.1 C), temperature source Oral, resp. rate 18, height  (1.575 m), weight 156 lb (70.761 kg), last menstrual period 08/19/2014, currently breastfeeding. General appearance: alert, cooperative and appears stated age Lungs: clear to auscultation bilaterally Heart: regular rate and rhythm Abdomen: soft, non-tender; bowel sounds normal Pelvic: 5/50/-2 Extremities: Homans sign is negative, no sign of DVT Presentation: cephalic Fetal monitoring 140/mod/+a/-d Uterine activity q 5-10 min Dilation: 5 Effacement (%): 50 Station: -2 Exam by:: Dr. Ashok Pall   Prenatal  labs: ABO, Rh: --/--/O POS (08/29 1710) Antibody: PENDING (08/29 1710) Rubella:   RPR: NON REAC (06/20 1430)  HBsAg: NEGATIVE (02/01 1513)  HIV: NONREACTIVE (06/20 1430)  GBS: Negative (08/03 0000)  1 hr Glucola 124 Genetic screening  Normal first screen Anatomy US normal  Prenatal Transfer Tool  Maternal Diabetes: No Genetic Screening: Normal Maternal Ultrasounds/Referrals: Normal Fetal Ultrasounds or other Referrals:  None Maternal Substance Abuse:  Nicotine Significant Maternal Medications:  None Significant Maternal Lab Results: Lab values include: Group B Strep negative  Results for orders placed or performed during the hospital encounter of 05/04/15 (from the past 24 hour(s))  CBC   Collection Time: 05/04/15  5:10 PM  Result Value Ref Range   WBC 13.8 (H) 4.0 - 10.5 K/uL   RBC 3.67 (L)  3.87 - 5.11 MIL/uL   Hemoglobin 11.0 (L) 12.0 - 15.0 g/dL   HCT 16.1 (L) 09.6 - 04.5 %   MCV 89.1 78.0 - 100.0 fL   MCH 30.0 26.0 - 34.0 pg   MCHC 33.6 30.0 - 36.0 g/dL   RDW 40.9 81.1 - 91.4 %   Platelets 363 150 - 400 K/uL  Type and screen   Collection Time: 05/04/15  5:10 PM  Result Value Ref Range   ABO/RH(D) O POS    Antibody Screen PENDING    Sample Expiration 05/07/2015     Patient Active Problem List   Diagnosis Date Noted  . Premature cervical dilation in third trimester 05/04/2015  . Threatened preterm labor 04/09/2015  . Supervision of other normal pregnancy 08/22/2013  . Current every day smoker 08/22/2013  . Tobacco use in pregnancy, antepartum 08/22/2013  . Depression with anxiety 01/06/2012  . Migraines 01/06/2012    Assessment: Heather [name] is a 34 y.o. G4P3003 at [redacted]w[redacted]d here directly admitted for dilated cervix and history rapid labor.  #Labor: cervix 5 cm dilated, now with moderate intensity ctxns since this afternoon. Will start pitocin and AROM when safe to do so. #Pain: Likely eventual epidural #FWB:  category 1 #ID:  gbs negativ #MOF: breast and bottle #MOC:undecided #Circ:  Yes, as outpatient  Cherrie Gauze [facility] 05/04/2015, 6:52 PM

## 2015-05-04 NOTE — Anesthesia Preprocedure Evaluation (Signed)
Anesthesia Evaluation  Patient identified by MRN, date of birth, ID band Patient awake and Patient confused    Reviewed: Allergy & Precautions, H&P , NPO status , [name] Chart, lab work & pertinent test results  Airway Mallampati: II       Dental   Pulmonary Current Smoker,  breath sounds clear to auscultation  Pulmonary exam normal       Cardiovascular Exercise Tolerance: Good Normal cardiovascular examRhythm:regular Rate:Normal     Neuro/Psych    GI/Hepatic   Endo/Other    Renal/GU      Musculoskeletal   Abdominal   Peds  Hematology   Anesthesia Other Findings   Reproductive/Obstetrics (+) Pregnancy                             Anesthesia Physical Anesthesia Plan  ASA: II  Anesthesia Plan: Epidural   Post-op Pain Management:    Induction:   Airway Management Planned:   Additional Equipment:   Intra-op Plan:   Post-operative Plan:   Informed Consent: I have reviewed the patients History and Physical, chart, labs and discussed the procedure including the risks, benefits and alternatives for the proposed anesthesia with the patient or authorized representative who has indicated his/her understanding and acceptance.     Plan Discussed with:   Anesthesia Plan Comments:         Anesthesia Quick Evaluation  

## 2015-05-05 ENCOUNTER — Encounter (HOSPITAL_COMMUNITY): Payer: Self-pay | Admitting: *Deleted

## 2015-05-05 DIAGNOSIS — Z3A38 38 weeks gestation of pregnancy: Secondary | ICD-10-CM

## 2015-05-05 LAB — CBC
HCT: 33.3 % — ABNORMAL LOW (ref 36.0–46.0)
Hemoglobin: 11 g/dL — ABNORMAL LOW (ref 12.0–15.0)
MCH: 29.6 pg (ref 26.0–34.0)
MCHC: 33 g/dL (ref 30.0–36.0)
MCV: 89.5 fL (ref 78.0–100.0)
PLATELETS: 315 10*3/uL (ref 150–400)
RBC: 3.72 MIL/uL — AB (ref 3.87–5.11)
RDW: 14.1 % (ref 11.5–15.5)
WBC: 19.3 10*3/uL — AB (ref 4.0–10.5)

## 2015-05-05 LAB — RPR: RPR: NONREACTIVE

## 2015-05-05 MED ORDER — BENZOCAINE-MENTHOL 20-0.5 % EX AERO: 1.0000 "application " | INHALATION_SPRAY | CUTANEOUS | Status: DC | PRN

## 2015-05-05 MED ORDER — DIPHENHYDRAMINE HCL 25 MG PO CAPS
25.0000 mg | ORAL_CAPSULE | Freq: Four times a day (QID) | ORAL | Status: DC | PRN
Start: 2015-05-05 — End: 2015-05-07

## 2015-05-05 MED ORDER — ZOLPIDEM TARTRATE 5 MG PO TABS: 5.0000 mg | ORAL_TABLET | Freq: Every evening | ORAL | Status: DC | PRN

## 2015-05-05 MED ORDER — TETANUS-DIPHTH-ACELL PERTUSSIS 5-2.5-18.5 LF-MCG/0.5 IM SUSP: 0.5000 mL | Freq: Once | INTRAMUSCULAR | Status: DC

## 2015-05-05 MED ORDER — IBUPROFEN 600 MG PO TABS
600.0000 mg | ORAL_TABLET | Freq: Four times a day (QID) | ORAL | Status: DC
  Administered 2015-05-05 – 2015-05-07 (×10): 600 mg via ORAL
  Filled 2015-05-05 (×10): qty 1

## 2015-05-05 MED ORDER — PRENATAL MULTIVITAMIN CH
1.0000 | ORAL_TABLET | Freq: Every day | ORAL | Status: DC
  Administered 2015-05-05: 1 via ORAL
  Filled 2015-05-05 (×2): qty 1

## 2015-05-05 MED ORDER — PNEUMOCOCCAL VAC POLYVALENT 25 MCG/0.5ML IJ INJ
0.5000 mL | INJECTION | INTRAMUSCULAR | Status: DC
  Filled 2015-05-05: qty 0.5

## 2015-05-05 MED ORDER — SENNOSIDES-DOCUSATE SODIUM 8.6-50 MG PO TABS
2.0000 | ORAL_TABLET | ORAL | Status: DC
  Administered 2015-05-06 (×2): 2 via ORAL
  Filled 2015-05-05 (×2): qty 2

## 2015-05-05 MED ORDER — ONDANSETRON HCL 4 MG/2ML IJ SOLN: 4.0000 mg | INTRAMUSCULAR | Status: DC | PRN

## 2015-05-05 MED ORDER — LANOLIN HYDROUS EX OINT: TOPICAL_OINTMENT | CUTANEOUS | Status: DC | PRN

## 2015-05-05 MED ORDER — OXYCODONE-ACETAMINOPHEN 5-325 MG PO TABS
1.0000 | ORAL_TABLET | ORAL | Status: DC | PRN
  Administered 2015-05-05 – 2015-05-07 (×7): 1 via ORAL
  Filled 2015-05-05 (×8): qty 1

## 2015-05-05 MED ORDER — ACETAMINOPHEN 325 MG PO TABS: 650.0000 mg | ORAL_TABLET | ORAL | Status: DC | PRN

## 2015-05-05 MED ORDER — SIMETHICONE 80 MG PO CHEW: 80.0000 mg | CHEWABLE_TABLET | ORAL | Status: DC | PRN

## 2015-05-05 MED ORDER — OXYCODONE-ACETAMINOPHEN 5-325 MG PO TABS
2.0000 | ORAL_TABLET | ORAL | Status: DC | PRN
  Administered 2015-05-05 – 2015-05-06 (×2): 2 via ORAL
  Filled 2015-05-05 (×2): qty 2

## 2015-05-05 MED ORDER — ONDANSETRON HCL 4 MG PO TABS: 4.0000 mg | ORAL_TABLET | ORAL | Status: DC | PRN

## 2015-05-05 MED ORDER — WITCH HAZEL-GLYCERIN EX PADS: 1.0000 "application " | MEDICATED_PAD | CUTANEOUS | Status: DC | PRN

## 2015-05-05 MED ORDER — DIBUCAINE 1 % RE OINT: 1.0000 "application " | TOPICAL_OINTMENT | RECTAL | Status: DC | PRN

## 2015-05-06 NOTE — Lactation Note (Addendum)
This note was copied from the chart of Heather [name]. Lactation Consultation Note  Mother states baby has been spitty w/ formula.  Suggest she offer more breastmilk. Mother wants to breastfeed for 3 months and supplement w/ formula. Discussed supply and demand and suggest she call if she would like help w/ latching. Mother wanted recommendations of formula brands to reduce spitty.  Spoke w/ Morrie Sheldon RN to aid in contacted Peds for formula suggestion.  Patient [name]: Heather [name] [id number] Date: 05/06/2015     Maternal Data    Feeding Feeding Type: Bottle Fed - Formula  LATCH Score/Interventions                      Lactation Tools Discussed/Used     Consult Status      Heather [name] 05/06/2015, 11:06 AM

## 2015-05-06 NOTE — Progress Notes (Signed)
POSTPARTUM PROGRESS NOTE  Post Partum Day 1  Subjective:  Else Habermann is a 34 y.o. [id number] [redacted]w[redacted]d s/p NSVD.  No acute events overnight.  Pt denies problems with ambulating, voiding or po intake.  She denies nausea or vomiting.  Pain is well controlled.  She has had flatus. She has not had bowel movement.  Lochia Small.  Plan for birth control is oral progesterone-only contraceptive.  Method of Feeding: both  Objective: Blood pressure 96/52, pulse 61, temperature 98 F (36.7 C), temperature source Oral, resp. rate 18, height  (1.575 m), weight 156 lb (70.761 kg), last menstrual period 08/19/2014, SpO2 98 %, unknown if currently breastfeeding.  Physical Exam:  General: alert, cooperative and no distress Lochia:normal flow Chest: CTAB Heart: RRR no m/r/g Abdomen: +BS, soft, nontender,  Uterine Fundus: firm,  DVT Evaluation: No evidence of DVT seen on physical exam. Extremities: trace edema   Recent Labs  05/04/15 1710 05/05/15 0525  HGB 11.0* 11.0*  HCT 32.7* 33.3*    Assessment/Plan:  ASSESSMENT: Audi Conover is a 34 y.o. [id number] [redacted]w[redacted]d s/p NSVD, doing well, but wants to stay an extra day as "my baby is spitting up too much."  Plan for discharge tomorrow   LOS: 2 days   Silvano Bilis 05/06/2015, 8:03 AM

## 2015-05-06 NOTE — Anesthesia Postprocedure Evaluation (Signed)
  Anesthesia Post-op Note  Patient: Heather [name]  Procedure(s) Performed: * No procedures listed *  Patient Location: Mother/Baby  Anesthesia Type:Epidural  Level of Consciousness: awake, alert  and oriented  Airway and Oxygen Therapy: Patient Spontanous Breathing  Post-op Pain: none  Post-op Assessment: Post-op Vital signs reviewed and [name] Cardiovascular Status Stable              Post-op Vital Signs: Reviewed and stable  Last Vitals:  Filed Vitals:   05/06/15 0632  BP: 96/52  Pulse: 61  Temp: 36.7 C  Resp: 18    Complications: No apparent anesthesia complications

## 2015-05-06 NOTE — Clinical Social Work Maternal (Signed)
CLINICAL SOCIAL WORK MATERNAL/CHILD NOTE  Patient Details  Name: Heather [name] MRN: [id number] Date of Birth: [birth year]  Date:  05/06/2015  Clinical Social Worker Initiating Note:  Loleta Books, LCSW Date/ Time Initiated:  05/06/15/1000     Child's Name:  Woodroe Chen   Legal Guardian:  Mother   Need for Interpreter:  None   Date of Referral:  05/05/15     Reason for Referral:  History of anxiety, depression, and postpartum depression   Referral Source:  [facility]   Address:  [address] [place], [place] [address]  Phone number:  [phone]   Household Members:  Minor Children   Natural Supports (not living in the home):  Immediate Family, Extended Family   Professional Supports: None   Employment: Homemaker   Type of Work:   N/A  Education:    N/A  Architect:  [employer]   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  2)Mental Health Concerns: MOB presents with history of anxiety since 2009.  MOB endorsed ongoing anxiety and postpartum depression after her last child was born in January 2015.  MOB reported that there continue to be symptoms during the pregnancy, but also endorsed presence of psychosocial stressors that contribute to her symptoms.   Cognitive State:  Able to Concentrate , Alert , Goal Oriented    Mood/Affect:  Happy , Anxious    CSW Assessment:  CSW received request for consult due to MOB presenting with a history of anxiety, depression, and postpartum depression.  MOB and FOB present in the room, and were noted to be easily engaged and receptive to the visit.  MOB openly discussed the  numerous psychosocial stressors; however, she did not present with readiness to begin to problem solve to reduce anxiety and environmental stressors.    Per MOB, she feels happy and excited upon the birth of the baby. She shared that she is concerned about experiencing postpartum depression since she experienced symptoms of depression and anxiety for approximately 8-9 months after her last child was born.  MOB continued to discuss how the symptoms impacted her relationship with the FOB and her other children since she was more irritable, easily agitated, and tearful.  MOB endorsed presence of numerous psychosocial stressors, including her and the FOB not living together (and needing to drive back and forth) and highly strained relationship with the father of her oldest two children.  MOB reported that she was prescribed Zoloft, but she chose to not start the medication since she prefers to not take any medication. Upon further exploration, MOB discussed the events that contribute to her desire to not be on any medications (family history of substance abuse).  CSW validated her feelings, but continued to provided education on medications that are not habit forming that may support her mental health.  MOB acknowledged the statement, but did not indicate any interest in starting medication at this time.  MOB and FOB continued to process and be forthcoming with the stress related to their strained relationship with the father of the MOB's oldest children. They reflected upon how it continues to be a source of stress since they are concerned about the MOB's  children when they are in their care.  MOB verbalized a desire to change the situation and limit their access to their father's home, but expressed a deep fear that her children would be mad at her if they changed their custody agreement and their schools.  CSW attempted to de-catastrophize the MOB's fears, introduced cognitive techniques, and began to assist the  parents to identify what is within their control that would assist them to feel less stressed and overwhelmed.  CSW noted that it was difficult for MOB to disengage in her anxious thought processes despite CSW efforts. MOB and FOB began to discuss eagerness and desire to participate in therapy as they are realizing the need to process their thoughts and feelings.  They requested referral information for therapy, and expressed appreciation for the information.    CSW reviewed education on perinatal mood and anxiety disorders, and MOB acknowledged that she has an increased risk due to history of anxiety since 2009, prior history of PMAD, and ongoing psychosocial stressors. MOB agreed to contact her medical provider if she notes that normative methods of coping are ineffective.   CSW Plan/Description:   1)Patient/Family Education: Perinatal mood and anxiety disorders 2)Information/Referral to [facility] : Outpatient therapy, [facility] (for assistance with custody concerns with the father of her oldest children) 3)No Further Intervention Required/No Barriers to Discharge    Kelby Fam 05/06/2015, 2:01 PM

## 2015-05-06 NOTE — Addendum Note (Signed)
Addendum  created 05/06/15 1610 by Elgie Congo, CRNA   Modules edited: Notes Section   Notes Section:  File: [id number]

## 2015-05-06 NOTE — Lactation Note (Signed)
This note was copied from the chart of Heather Linsay Vogt. Lactation Consultation Note Experienced BF mom BF her other 3 children for 1 month and 2 months. She has 32 yr old, 11 yr. Old and 24 month old. Mom stated she is breast and bottle. Mom had all ready given formula when I had arrived into room, and baby was suckling on pacifier. expleianed to mom pacifiers could cause nipple confusion. Baby latches on well.  Mom encouraged to feed baby 8-12 times/24 hours and with feeding cues. Referred to Baby and Me Book in Breastfeeding section Pg. 22-23 for position options and Proper latch demonstration. Mom encouraged to do skin-to-skin. Educated about newborn behavior, supply and demand, I&O. WH/LC brochure given w/resources, support groups and LC services.  Patient Name: Heather [name] [id number] Date: 05/06/2015     Maternal Data    Feeding Feeding Type: Breast Fed Length of feed: 6 min  LATCH Score/Interventions                      Lactation Tools Discussed/Used     Consult Status      CARVER, LAURA G 05/06/2015, 3:24 AM

## 2015-05-07 MED ORDER — IBUPROFEN 600 MG PO TABS: 600.0000 mg | ORAL_TABLET | Freq: Four times a day (QID) | ORAL | Status: DC | PRN

## 2015-05-07 MED ORDER — NORETHINDRONE 0.35 MG PO TABS: ORAL_TABLET | ORAL | Status: DC

## 2015-05-07 NOTE — Lactation Note (Signed)
This note was copied from the chart of Heather [name]. Lactation Consultation Note  Baby less than 6 lbs.  Mother is pumping approx 75 ml. Provided [facility] loaner pump.  She has appt on Tues 9/6. Reviewed engorgement care, monitoring voids/stools and milk storage.    Patient Name: Heather Malissia Rabbani [id number] Date: 05/07/2015     Maternal Data    Feeding Feeding Type: Breast Milk  LATCH Score/Interventions                      Lactation Tools Discussed/Used     Consult Status      Hardie Pulley 05/07/2015, 10:23 AM

## 2015-05-07 NOTE — Discharge Summary (Signed)
Obstetric Discharge Summary Reason for Admission: induction of labor for advanced cx dilation & hx rapid labor Prenatal Procedures: none Intrapartum Procedures: spontaneous vaginal delivery Postpartum Procedures: none Complications-Operative and Postpartum: none HEMOGLOBIN  Date Value Ref Range Status  05/05/2015 11.0* 12.0 - 15.0 g/dL Final   HCT  Date Value Ref Range Status  05/05/2015 33.3* 36.0 - 46.0 % Final   Heather [name] is a 34yo [id number] admitted at 38.2wks on the evening of 8/29 after advanced cx dilation was noted in the office. After she was settled on L&D, Pitocin was started and membranes were ruptured and she progressed to SVD shortly after midnight on 8/30. By PPD#2 she was deemed to have received the full benefit of her hospital stay and was discharged home. She was breast and bottlefeeding and desired POPs for contraception.  Physical Exam:  General: alert, cooperative and no distress  Heart: RRR Lungs: nl effort Lochia: appropriate Uterine Fundus: firm DVT Evaluation: No evidence of DVT seen on physical exam.  Discharge Diagnoses: Term Pregnancy-delivered  Discharge Information: Date: 05/07/2015 Activity: pelvic rest Diet: routine Medications: PNV, Ibuprofen and Micronor Condition: stable Instructions: refer to practice specific booklet Discharge to: home Follow-up Information    [facility] with [facility]. Schedule an appointment as soon as possible for a visit in 6 weeks.   Specialty:  Obstetrics and Gynecology   Why:  For your postpartum appointment.   Contact information:   [address] [place] [address] [phone]      Newborn Data: Live born female  Birth Weight: 6 lb 5.1 oz (2865 g) APGAR: 9, 9  Home with mother.  Cam Hai CNM 05/07/2015, 7:56 AM

## 2015-05-07 NOTE — Discharge Instructions (Signed)

## 2015-05-28 ENCOUNTER — Telehealth: Payer: Self-pay | Admitting: *Deleted

## 2015-05-28 MED ORDER — NORETHIN ACE-ETH ESTRAD-FE 1-20 MG-MCG(24) PO TABS: 1.0000 | ORAL_TABLET | Freq: Every day | ORAL | Status: DC

## 2015-05-28 NOTE — Telephone Encounter (Signed)
Pt called stating she was going to stop breastfeeding and had been given progesterone only birth control pills, requesting a different pill to be called in since she was going to stop breastfeeding.  Sent rx for Lo Estrin FE to pharmacy.  Reviewed methods to help dry up her breastmilk.  Pt acknowledged instruction.

## 2015-06-15 ENCOUNTER — Ambulatory Visit: Payer: Medicaid Other | Admitting: Obstetrics & Gynecology

## 2015-06-23 ENCOUNTER — Encounter: Payer: Self-pay | Admitting: *Deleted

## 2015-06-23 ENCOUNTER — Ambulatory Visit (INDEPENDENT_AMBULATORY_CARE_PROVIDER_SITE_OTHER): Payer: Medicaid Other | Admitting: Obstetrics & Gynecology

## 2015-06-23 ENCOUNTER — Encounter: Payer: Self-pay | Admitting: Obstetrics & Gynecology

## 2015-06-23 DIAGNOSIS — N8110 Cystocele, unspecified: Secondary | ICD-10-CM

## 2015-06-23 DIAGNOSIS — IMO0002 Reserved for concepts with insufficient information to code with codable children: Secondary | ICD-10-CM

## 2015-06-23 DIAGNOSIS — N816 Rectocele: Secondary | ICD-10-CM

## 2015-06-23 NOTE — Progress Notes (Signed)
  Subjective:     Heather [name] is a 34 y.o. MW P4  female who presents for a postpartum visit. She is complaining of vaginal laxity, making having BMs difficult. She is 6 weeks postpartum following a spontaneous vaginal delivery. I have fully reviewed the prenatal and intrapartum course. The delivery was at 39 1/2 gestational weeks. Outcome: spontaneous vaginal delivery. Anesthesia: epidural. Postpartum course has been nomral. Baby's course has been noral. Baby is feeding by bottle - Enfamil Lactofree. Bleeding no bleeding. Bowel function is difficult due to her rectocele. Bladder function is normal. Patient is sexually active. Contraception method is OCP (estrogen/progesterone). Postpartum depression screening: negative.  The following portions of the patient's history were reviewed and updated as appropriate: allergies, current medications, past family history, past medical history, past social history, past surgical history and problem list.  Review of Systems Pertinent items are noted in HPI.   Objective:    BP 128/80 mmHg  Pulse 89  Wt 142 lb (64.411 kg)  LMP 06/15/2015  Breastfeeding? No  General:  alert   Breasts:  inspection negative, no nipple discharge or bleeding, no masses or nodularity palpable  Lungs: clear to auscultation bilaterally  Heart:  regular rate and rhythm, S1, S2 normal, no murmur, click, rub or gallop  Abdomen: soft, non-tender; bowel sounds normal; no masses,  no organomegaly   Vulva:  normal  Vagina: 2nd degree cystocele and rectocele  Cervix:  anteverted  Corpus: normal  Adnexa:  normal adnexa  Rectal Exam: Not performed.        Assessment:     Normal postpartum exam. Pap smear not done at today's visit.   Plan:    1. Contraception: She would like to schedule a lap BS for permanent sterility. 2. Cystocele and rectocele- I will schedule her for a A&P repair with perineorrhaphy 3. Follow up in: a few months  or as needed.

## 2015-06-25 ENCOUNTER — Encounter (HOSPITAL_COMMUNITY): Payer: Self-pay | Admitting: *Deleted

## 2015-07-16 ENCOUNTER — Encounter (HOSPITAL_COMMUNITY): Payer: Self-pay

## 2015-08-04 ENCOUNTER — Ambulatory Visit (HOSPITAL_COMMUNITY)
Admission: RE | Admit: 2015-08-04 | Discharge: 2015-08-05 | Disposition: A | Discharge status: Home / Self Care | Payer: Medicaid Other | Source: Ambulatory Visit | Attending: Obstetrics & Gynecology | Admitting: Obstetrics & Gynecology

## 2015-08-04 ENCOUNTER — Encounter (HOSPITAL_COMMUNITY): Payer: Self-pay | Admitting: *Deleted

## 2015-08-04 ENCOUNTER — Ambulatory Visit (HOSPITAL_COMMUNITY): Payer: Medicaid Other | Admitting: Anesthesiology

## 2015-08-04 ENCOUNTER — Encounter (HOSPITAL_COMMUNITY)
Admission: RE | Disposition: A | Discharge status: Home / Self Care | Payer: Self-pay | Source: Ambulatory Visit | Attending: Obstetrics & Gynecology

## 2015-08-04 DIAGNOSIS — Z302 Encounter for sterilization: Secondary | ICD-10-CM | POA: Insufficient documentation

## 2015-08-04 DIAGNOSIS — K623 Rectal prolapse: Secondary | ICD-10-CM | POA: Diagnosis present

## 2015-08-04 DIAGNOSIS — N816 Rectocele: Secondary | ICD-10-CM | POA: Insufficient documentation

## 2015-08-04 DIAGNOSIS — N8110 Cystocele, unspecified: Secondary | ICD-10-CM | POA: Insufficient documentation

## 2015-08-04 DIAGNOSIS — F17210 Nicotine dependence, cigarettes, uncomplicated: Secondary | ICD-10-CM | POA: Insufficient documentation

## 2015-08-04 HISTORY — DX: Paresthesia of skin: R20.2

## 2015-08-04 HISTORY — PX: LAPAROSCOPIC BILATERAL SALPINGECTOMY: SHX5889

## 2015-08-04 HISTORY — PX: ANTERIOR AND POSTERIOR REPAIR: SHX5121

## 2015-08-04 HISTORY — DX: Anemia, unspecified: D64.9

## 2015-08-04 LAB — CBC
HCT: 43.3 % (ref 36.0–46.0)
HEMOGLOBIN: 14.7 g/dL (ref 12.0–15.0)
MCH: 29.9 pg (ref 26.0–34.0)
MCHC: 33.9 g/dL (ref 30.0–36.0)
MCV: 88 fL (ref 78.0–100.0)
Platelets: 333 10*3/uL (ref 150–400)
RBC: 4.92 MIL/uL (ref 3.87–5.11)
RDW: 13 % (ref 11.5–15.5)
WBC: 8.6 10*3/uL (ref 4.0–10.5)

## 2015-08-04 LAB — PREGNANCY, URINE: Preg Test, Ur: NEGATIVE

## 2015-08-04 SURGERY — SALPINGECTOMY, BILATERAL, LAPAROSCOPIC
Anesthesia: General | Site: Vagina

## 2015-08-04 MED ORDER — ONDANSETRON HCL 4 MG/2ML IJ SOLN
INTRAMUSCULAR | Status: AC
  Filled 2015-08-04: qty 2

## 2015-08-04 MED ORDER — FENTANYL CITRATE (PF) 100 MCG/2ML IJ SOLN
25.0000 ug | INTRAMUSCULAR | Status: DC | PRN
  Administered 2015-08-04: 25 ug via INTRAVENOUS
  Administered 2015-08-04 (×2): 50 ug via INTRAVENOUS

## 2015-08-04 MED ORDER — SCOPOLAMINE 1 MG/3DAYS TD PT72
MEDICATED_PATCH | TRANSDERMAL | Status: DC
Start: 2015-08-04 — End: 2015-08-05
  Administered 2015-08-04: 1.5 mg via TRANSDERMAL
  Filled 2015-08-04: qty 1

## 2015-08-04 MED ORDER — ROCURONIUM BROMIDE 100 MG/10ML IV SOLN
INTRAVENOUS | Status: DC | PRN
  Administered 2015-08-04: 40 mg via INTRAVENOUS

## 2015-08-04 MED ORDER — ESTRADIOL 0.1 MG/GM VA CREA
TOPICAL_CREAM | VAGINAL | Status: DC | PRN
  Administered 2015-08-04: 1 via VAGINAL

## 2015-08-04 MED ORDER — OXYCODONE-ACETAMINOPHEN 5-325 MG PO TABS: 1.0000 | ORAL_TABLET | Freq: Four times a day (QID) | ORAL | Status: DC | PRN

## 2015-08-04 MED ORDER — GLYCOPYRROLATE 0.2 MG/ML IJ SOLN
INTRAMUSCULAR | Status: AC
  Filled 2015-08-04: qty 2

## 2015-08-04 MED ORDER — FENTANYL CITRATE (PF) 250 MCG/5ML IJ SOLN
INTRAMUSCULAR | Status: AC
  Filled 2015-08-04: qty 5

## 2015-08-04 MED ORDER — FLAVOXATE HCL 100 MG PO TABS
100.0000 mg | ORAL_TABLET | Freq: Three times a day (TID) | ORAL | Status: DC
  Administered 2015-08-04 – 2015-08-05 (×3): 100 mg via ORAL
  Filled 2015-08-04 (×7): qty 1

## 2015-08-04 MED ORDER — LIDOCAINE HCL (CARDIAC) 20 MG/ML IV SOLN
INTRAVENOUS | Status: AC
  Filled 2015-08-04: qty 5

## 2015-08-04 MED ORDER — ROCURONIUM BROMIDE 100 MG/10ML IV SOLN
INTRAVENOUS | Status: AC
  Filled 2015-08-04: qty 1

## 2015-08-04 MED ORDER — NEOSTIGMINE METHYLSULFATE 10 MG/10ML IV SOLN
INTRAVENOUS | Status: DC | PRN
  Administered 2015-08-04: 2 mg via INTRAVENOUS

## 2015-08-04 MED ORDER — GLYCOPYRROLATE 0.2 MG/ML IJ SOLN
INTRAMUSCULAR | Status: DC | PRN
  Administered 2015-08-04: 0.4 mg via INTRAVENOUS

## 2015-08-04 MED ORDER — ONDANSETRON HCL 4 MG/2ML IJ SOLN
INTRAMUSCULAR | Status: DC | PRN
  Administered 2015-08-04: 4 mg via INTRAVENOUS

## 2015-08-04 MED ORDER — PROMETHAZINE HCL 25 MG/ML IJ SOLN: 6.2500 mg | INTRAMUSCULAR | Status: DC | PRN

## 2015-08-04 MED ORDER — FENTANYL CITRATE (PF) 100 MCG/2ML IJ SOLN
INTRAMUSCULAR | Status: DC | PRN
  Administered 2015-08-04: 100 ug via INTRAVENOUS
  Administered 2015-08-04 (×2): 50 ug via INTRAVENOUS

## 2015-08-04 MED ORDER — DEXAMETHASONE SODIUM PHOSPHATE 4 MG/ML IJ SOLN
INTRAMUSCULAR | Status: AC
Start: 2015-08-04 — End: 2015-08-04
  Filled 2015-08-04: qty 1

## 2015-08-04 MED ORDER — PROPOFOL 10 MG/ML IV BOLUS
INTRAVENOUS | Status: AC
  Filled 2015-08-04: qty 20

## 2015-08-04 MED ORDER — SODIUM CHLORIDE 0.9 % IJ SOLN
INTRAMUSCULAR | Status: AC
  Filled 2015-08-04: qty 50

## 2015-08-04 MED ORDER — MIDAZOLAM HCL 2 MG/2ML IJ SOLN
INTRAMUSCULAR | Status: DC | PRN
  Administered 2015-08-04: 2 mg via INTRAVENOUS

## 2015-08-04 MED ORDER — DEXAMETHASONE SODIUM PHOSPHATE 10 MG/ML IJ SOLN
INTRAMUSCULAR | Status: DC | PRN
  Administered 2015-08-04: 4 mg via INTRAVENOUS

## 2015-08-04 MED ORDER — KETOROLAC TROMETHAMINE 30 MG/ML IJ SOLN
INTRAMUSCULAR | Status: DC | PRN
  Administered 2015-08-04: 30 mg via INTRAVENOUS

## 2015-08-04 MED ORDER — OXYCODONE-ACETAMINOPHEN 5-325 MG PO TABS
1.0000 | ORAL_TABLET | ORAL | Status: DC | PRN
Start: 2015-08-04 — End: 2015-08-05
  Administered 2015-08-04 – 2015-08-05 (×5): 2 via ORAL
  Filled 2015-08-04 (×5): qty 2

## 2015-08-04 MED ORDER — LACTATED RINGERS IV SOLN
INTRAVENOUS | Status: DC
  Administered 2015-08-04 (×3): via INTRAVENOUS

## 2015-08-04 MED ORDER — PROPOFOL 10 MG/ML IV BOLUS
INTRAVENOUS | Status: DC | PRN
  Administered 2015-08-04: 150 mg via INTRAVENOUS
  Administered 2015-08-04: 50 mg via INTRAVENOUS

## 2015-08-04 MED ORDER — ESTRADIOL 0.1 MG/GM VA CREA
TOPICAL_CREAM | VAGINAL | Status: AC
  Filled 2015-08-04: qty 42.5

## 2015-08-04 MED ORDER — FENTANYL CITRATE (PF) 100 MCG/2ML IJ SOLN
INTRAMUSCULAR | Status: AC
  Administered 2015-08-04: 25 ug via INTRAVENOUS
  Filled 2015-08-04: qty 2

## 2015-08-04 MED ORDER — SCOPOLAMINE 1 MG/3DAYS TD PT72
1.0000 | MEDICATED_PATCH | TRANSDERMAL | Status: DC
  Administered 2015-08-04: 1.5 mg via TRANSDERMAL

## 2015-08-04 MED ORDER — LACTATED RINGERS IV SOLN: INTRAVENOUS | Status: DC

## 2015-08-04 MED ORDER — FENTANYL CITRATE (PF) 100 MCG/2ML IJ SOLN
INTRAMUSCULAR | Status: AC
  Administered 2015-08-04: 25 ug via NASAL
  Filled 2015-08-04: qty 2

## 2015-08-04 MED ORDER — BUPIVACAINE HCL (PF) 0.5 % IJ SOLN
INTRAMUSCULAR | Status: AC
Start: 2015-08-04 — End: 2015-08-04
  Filled 2015-08-04: qty 30

## 2015-08-04 MED ORDER — NEOSTIGMINE METHYLSULFATE 10 MG/10ML IV SOLN
INTRAVENOUS | Status: AC
  Filled 2015-08-04: qty 1

## 2015-08-04 MED ORDER — MIDAZOLAM HCL 2 MG/2ML IJ SOLN
INTRAMUSCULAR | Status: AC
  Filled 2015-08-04: qty 2

## 2015-08-04 MED ORDER — BUPIVACAINE HCL (PF) 0.5 % IJ SOLN
INTRAMUSCULAR | Status: DC | PRN
  Administered 2015-08-04: 80 mL

## 2015-08-04 MED ORDER — POLYETHYLENE GLYCOL 3350 17 GM/SCOOP PO POWD: 1.0000 | Freq: Once | ORAL | Status: DC

## 2015-08-04 MED ORDER — KETOROLAC TROMETHAMINE 30 MG/ML IJ SOLN
INTRAMUSCULAR | Status: AC
  Filled 2015-08-04: qty 1

## 2015-08-04 MED ORDER — LIDOCAINE HCL (CARDIAC) 20 MG/ML IV SOLN
INTRAVENOUS | Status: DC | PRN
  Administered 2015-08-04: 100 mg via INTRAVENOUS

## 2015-08-04 SURGICAL SUPPLY — 47 items
APPLICATOR COTTON TIP 6IN STRL (MISCELLANEOUS) IMPLANT
BLADE SURG 15 STRL LF C SS BP (BLADE) ×2 IMPLANT
BLADE SURG 15 STRL SS (BLADE) ×2
CATH ROBINSON RED A/P 16FR (CATHETERS) ×4 IMPLANT
CLOSURE WOUND 1/4 X3 (GAUZE/BANDAGES/DRESSINGS) ×1
CLOTH BEACON ORANGE TIMEOUT ST (SAFETY) ×4 IMPLANT
DECANTER SPIKE VIAL GLASS SM (MISCELLANEOUS) ×8 IMPLANT
DRSG COVADERM PLUS 2X2 (GAUZE/BANDAGES/DRESSINGS) IMPLANT
DRSG OPSITE POSTOP 3X4 (GAUZE/BANDAGES/DRESSINGS) ×4 IMPLANT
DURAPREP 26ML APPLICATOR (WOUND CARE) ×4 IMPLANT
ELECT REM PT RETURN 9FT ADLT (ELECTROSURGICAL) ×4
ELECTRODE REM PT RTRN 9FT ADLT (ELECTROSURGICAL) ×2 IMPLANT
GAUZE PACKING 1 X5 YD ST (GAUZE/BANDAGES/DRESSINGS) ×4 IMPLANT
GAUZE SPONGE 4X4 16PLY XRAY LF (GAUZE/BANDAGES/DRESSINGS) ×4 IMPLANT
GLOVE BIO SURGEON STRL SZ 6.5 (GLOVE) ×3 IMPLANT
GLOVE BIO SURGEONS STRL SZ 6.5 (GLOVE) ×1
GLOVE BIOGEL PI IND STRL 7.0 (GLOVE) ×2 IMPLANT
GLOVE BIOGEL PI INDICATOR 7.0 (GLOVE) ×2
GOWN STRL REUS W/TWL LRG LVL3 (GOWN DISPOSABLE) ×16 IMPLANT
NDL SAFETY ECLIPSE 18X1.5 (NEEDLE) ×2 IMPLANT
NEEDLE HYPO 18GX1.5 SHARP (NEEDLE) ×2
NEEDLE HYPO 22GX1.5 SAFETY (NEEDLE) ×8 IMPLANT
NEEDLE INSUFFLATION 120MM (ENDOMECHANICALS) ×4 IMPLANT
NS IRRIG 1000ML POUR BTL (IV SOLUTION) ×4 IMPLANT
PACK LAPAROSCOPY BASIN (CUSTOM PROCEDURE TRAY) ×4 IMPLANT
PACK VAGINAL WOMENS (CUSTOM PROCEDURE TRAY) IMPLANT
PAD POSITIONING PINK XL (MISCELLANEOUS) ×4 IMPLANT
PENCIL BUTTON HOLSTER BLD 10FT (ELECTRODE) ×4 IMPLANT
SHEARS HARMONIC ACE PLUS 36CM (ENDOMECHANICALS) ×4 IMPLANT
STRIP CLOSURE SKIN 1/4X3 (GAUZE/BANDAGES/DRESSINGS) ×3 IMPLANT
SUT VIC AB 0 CT1 27 (SUTURE)
SUT VIC AB 0 CT1 27XBRD ANBCTR (SUTURE) IMPLANT
SUT VIC AB 2-0 CT1 27 (SUTURE) ×8
SUT VIC AB 2-0 CT1 TAPERPNT 27 (SUTURE) ×8 IMPLANT
SUT VIC AB 2-0 CT2 27 (SUTURE) ×24 IMPLANT
SUT VICRYL 0 UR6 27IN ABS (SUTURE) ×4 IMPLANT
SUT VICRYL 4-0 PS2 18IN ABS (SUTURE) ×4 IMPLANT
SYR 30ML LL (SYRINGE) ×4 IMPLANT
TOWEL OR 17X24 6PK STRL BLUE (TOWEL DISPOSABLE) ×8 IMPLANT
TRAY FOLEY CATH SILVER 14FR (SET/KITS/TRAYS/PACK) ×4 IMPLANT
TROCAR OPTI TIP 5M 100M (ENDOMECHANICALS) ×8 IMPLANT
TROCAR XCEL DIL TIP R 11M (ENDOMECHANICALS) ×4 IMPLANT
TUBING NON-CON 1/4 X 20 CONN (TUBING) ×3 IMPLANT
TUBING NON-CON 1/4 X 20' CONN (TUBING) ×1
WARMER LAPAROSCOPE (MISCELLANEOUS) ×4 IMPLANT
WATER STERILE IRR 1000ML POUR (IV SOLUTION) ×4 IMPLANT
YANKAUER SUCT BULB TIP NO VENT (SUCTIONS) ×8 IMPLANT

## 2015-08-04 NOTE — Transfer of Care (Signed)
Immediate Anesthesia Transfer of Care Note  Patient: Heather [name]  Procedure(s) Performed: Procedure(s): LAPAROSCOPIC BILATERAL SALPINGECTOMY (Bilateral) ANTERIOR (CYSTOCELE) AND POSTERIOR REPAIR (RECTOCELE) (N/A)  Patient Location: PACU  Anesthesia Type:General  Level of Consciousness: awake  Airway & Oxygen Therapy: Patient Spontanous Breathing  Post-op Assessment: Report given to PACU RN  Post vital signs: stable  Filed Vitals:   08/04/15 1130  BP: 120/79  Pulse: 84  Temp: 36.6 C  Resp: 18    Complications: No apparent anesthesia complications

## 2015-08-04 NOTE — Anesthesia Preprocedure Evaluation (Addendum)
Anesthesia Evaluation  Patient identified by MRN, date of birth, ID band Patient awake    Reviewed: Allergy & Precautions, NPO status , [name] Chart, lab work & pertinent test results  History of Anesthesia Complications Negative for: history of anesthetic complications  Airway Mallampati: II  TM Distance: >3 FB Neck ROM: Full    Dental  (+) Dental Advisory Given, Chipped   Pulmonary Current Smoker,    Pulmonary exam normal breath sounds clear to auscultation       Cardiovascular Exercise Tolerance: Good (-) hypertension(-) angina(-) CAD and (-) Past MI negative cardio ROS Normal cardiovascular exam Rhythm:Regular Rate:Normal     Neuro/Psych  Headaches, PSYCHIATRIC DISORDERS Anxiety    GI/Hepatic negative GI ROS, Neg liver ROS,   Endo/Other  negative endocrine ROS  Renal/GU negative Renal ROS     Musculoskeletal negative musculoskeletal ROS (+)   Abdominal   Peds  Hematology  (+) Blood dyscrasia, anemia ,   Anesthesia Other Findings Day of surgery medications reviewed with the patient.  Reproductive/Obstetrics negative OB ROS                           Anesthesia Physical Anesthesia Plan  ASA: II  Anesthesia Plan: General   Post-op Pain Management:    Induction: Intravenous  Airway Management Planned: Oral ETT  Additional Equipment:   Intra-op Plan:   Post-operative Plan: Extubation in OR  Informed Consent: I have reviewed the patients History and Physical, chart, labs and discussed the procedure including the risks, benefits and alternatives for the proposed anesthesia with the patient or authorized representative who has indicated his/her understanding and acceptance.   Dental advisory given  Plan Discussed with: CRNA  Anesthesia Plan Comments: (Risks/benefits of general anesthesia discussed with patient including risk of damage to teeth, lips, gum, and tongue,  nausea/vomiting, allergic reactions to medications, and the possibility of heart attack, stroke and death.  All patient questions answered.  Patient wishes to proceed.)       Anesthesia Quick Evaluation

## 2015-08-04 NOTE — Anesthesia Procedure Notes (Signed)
Procedure Name: Intubation Date/Time: 08/04/2015 12:16 PM Performed by: Cephus ShellingBURGER, Yarianna Varble A Pre-anesthesia Checklist: Patient identified, Emergency Drugs available, Suction available and Patient being monitored Patient Re-evaluated:Patient Re-evaluated prior to inductionOxygen Delivery Method: Circle system utilized and Simple face mask Preoxygenation: Pre-oxygenation with 100% oxygen Intubation Type: IV induction and Inhalational induction Ventilation: Mask ventilation without difficulty Laryngoscope Size: Mac and 3 Grade View: Grade II Tube type: Oral Tube size: 7.0 mm Number of attempts: 1 Airway Equipment and Method: Stylet Placement Confirmation: ETT inserted through vocal cords under direct vision,  positive ETCO2 and breath sounds checked- equal and bilateral Secured at: 21 (right lip) cm Tube secured with: Tape Dental Injury: Teeth and Oropharynx as per pre-operative assessment

## 2015-08-04 NOTE — Anesthesia Postprocedure Evaluation (Signed)
Anesthesia Post Note  Patient: Heather [name]  Procedure(s) Performed: Procedure(s) (LRB): LAPAROSCOPIC BILATERAL SALPINGECTOMY (Bilateral) ANTERIOR (CYSTOCELE) AND POSTERIOR REPAIR (RECTOCELE) (N/A)  Patient location during evaluation: PACU Anesthesia Type: General Level of consciousness: awake and alert Pain management: pain level controlled Vital Signs Assessment: post-procedure vital signs reviewed and stable Respiratory status: spontaneous breathing, nonlabored ventilation, respiratory function stable and patient connected to nasal cannula oxygen Cardiovascular status: blood pressure returned to baseline and stable Postop Assessment: no signs of nausea or vomiting Anesthetic complications: no    Last Vitals:  Filed Vitals:   08/04/15 1515 08/04/15 1530  BP: 116/77 98/56  Pulse: 69 74  Temp:  36.9 C  Resp: 16 15    Last Pain:  Filed Vitals:   08/04/15 1532  PainSc: 4                  Cecile HearingStephen Edward Dacey Milberger

## 2015-08-04 NOTE — Op Note (Signed)
08/04/2015  2:01 PM  PATIENT:  Heather [name]  34 y.o. female  PRE-OPERATIVE DIAGNOSIS:  Undesired fertility and symptomatic rectocele and cystocele  POST-OPERATIVE DIAGNOSIS: same  PROCEDURE:  Procedure(s): LAPAROSCOPIC BILATERAL SALPINGECTOMY (Bilateral) ANTERIOR (CYSTOCELE) AND POSTERIOR REPAIR (RECTOCELE) (N/A)  SURGEON:  Surgeon(s) and Role:    * Allie Bossier, MD - Primary  ANESTHESIA:   general  EBL:  Total I/O In: 1800 [I.V.:1800] Out: 225 [Urine:50; Blood:175]  BLOOD ADMINISTERED:none  DRAINS: none   LOCAL MEDICATIONS USED:  MARCAINE     SPECIMEN:  Source of Specimen:  both oviducts  DISPOSITION OF SPECIMEN:  PATHOLOGY  COUNTS:  YES  TOURNIQUET:  * No tourniquets in log *  DICTATION: .Dragon Dictation  PLAN OF CARE: Discharge to home after PACU  PATIENT DISPOSITION:  PACU - hemodynamically stable.   Delay start of Pharmacological VTE agent (>24hrs) due to surgical blood loss or risk of bleeding: not applicable  The risks, benefits, and alternatives of surgery were explained, understood, accepted. She is certain that she wants permanent sterility. She understands that this is not reversible. She understands there is a small failure rate of this procedure. She also would like to have both oviducts removed her due newest recommendations to help prevent ovarian/peritoneal cancer. In the operating room she was placed in the dorsal lithotomy position, and general anesthesia was given without complication. Her abdomen and vagina were prepped and draped in the usual sterile fashion. A timeout procedure was done. A bimanual exam revealed a small anteverted and mobile uterus. Her adnexa felt normal. A Hulka manipulator was placed. Her bladder was emptied with a Helbig catheter. Gloves were changed, and attention was turned to the abdomen. Approximately the 5 mL of 0.5% Marcaine was injected into the umbilicus. A vertical incision was made at the site. A varies needle was  placed intraperitoneally. Low-flow CO2 was used to insufflate the abdomen to approximately 3-1/2 L. Once a good pneumoperitoneum was established, a 10 mm Excel trocar was placed. Laparoscopy confirmed correct placement. A 5 mm port was placed in each lower quadrant under direct laparoscopic visualization after injecting 0.5% Marcaine in the incision sites. Her pelvis and upper abdomen appeared normal. A Harmonic scapel was used to hemostatically removed both oviducts. I switched to a 5 mm camera and removed the oviducts through the umbilical port.  I removed the 5 mm ports and noted hemostasis. The umbilical fascia was closed with a 0 vicryl suture. No defects were palpable. A subcuticular closure was done with 4-0 Vicryl suture at all incision sites. A Steri-Strip was placed across each incision.  I then proceeded with the A&P repair. A solution of 30 cc of 0.5% marcaine was mixed with 50 cc of injectable saline. I used this solution for hydrodissection of the vaginal mucosa.  I made a lengthwise incision in her posterior vaginal mucosa up to her cervix and then disected the vaginal mucosa off of the rectovaginal fascia. I brought the edges of the fascia together, reducing the rectocele. 2-0 vicryl suture was used unless otherwise specified. Interrupted sutures were used for the first layer. I then removed the extra tissue and closed the vaginal mucosa with a running, locking suture. I closed the perineum as one would for an episiotomy. Excellent cosmetic results were noted. Hemostasis was noted. I then did the Anterior repair. In this case, the cystocele only extended about half way down the vagina so I only excised the tissue to that point. Again, hemostasis was noted. Vaginal packing  was used at the end of the case.  She was extubated and taken to the recovery room in stable condition.

## 2015-08-04 NOTE — H&P (Signed)
Heather [name] is a 34 y.o. MW P4 female who presents for a  Lap BS, a & p repair. She is complaining of vaginal laxity, making having BMs difficult. She also is certain that she does not want many more kids and she wants permanent sterility. She declines alternatives.   [name] last menstrual period was 06/24/2015 (approximate).    Past Medical History  Diagnosis Date  . Anxiety     stopped meds with pregnancy  . Tobacco abuse   . Paresthesia of both feet   . Anemia     History of    Past Surgical History  Procedure Laterality Date  . Hernia repair      when 8 wks old  . Wisdom tooth extraction      Family History  Problem Relation Age of Onset  . Alcohol abuse Neg Hx   . Arthritis Neg Hx   . Asthma Neg Hx   . Birth defects Neg Hx   . Cancer Neg Hx   . COPD Neg Hx   . Depression Neg Hx   . Diabetes Neg Hx   . Drug abuse Neg Hx   . Early death Neg Hx   . Hearing loss Neg Hx   . Heart disease Neg Hx   . Hyperlipidemia Neg Hx   . Hypertension Neg Hx   . Kidney disease Neg Hx   . Learning disabilities Neg Hx   . Mental illness Neg Hx   . Mental retardation Neg Hx   . Miscarriages / Stillbirths Neg Hx   . Stroke Neg Hx   . Vision loss Neg Hx   . Varicose Veins Neg Hx     Social History:  reports that she has been smoking Cigarettes.  She has a 20 pack-year smoking history. She has never used smokeless tobacco. She reports that she drinks alcohol. She reports that she does not use illicit drugs.  Allergies: No Known Allergies  Prescriptions prior to admission  Medication Sig Dispense Refill Last Dose  . ibuprofen (ADVIL,MOTRIN) 600 MG tablet Take 1 tablet (600 mg total) by mouth every 6 (six) hours as needed. (Patient taking differently: Take 600 mg by mouth every 6 (six) hours as needed for moderate pain. ) 50 tablet 1 Past Week at Unknown time  . Norethindrone Acetate-Ethinyl Estrad-FE (LOESTRIN 24 FE) 1-20 MG-MCG(24) tablet Take 1 tablet by mouth daily. 1  Package 11 08/03/2015 at Unknown time    ROS  Blood pressure 120/79, pulse 84, temperature 97.9 F (36.6 C), temperature source Oral, resp. rate 18, height  (1.549 m), weight 63.504 kg (140 lb), last menstrual period 06/24/2015, SpO2 99 %, not currently breastfeeding. Physical Exam  Heart- rrr Lungs- CTAB Abd- benign Vagina- 2nd degree cystocele and rectocele noted  Results for orders placed or performed during the hospital encounter of 08/04/15 (from the past 24 hour(s))  CBC     Status: None   Collection Time: 08/04/15 11:20 AM  Result Value Ref Range   WBC 8.6 4.0 - 10.5 K/uL   RBC 4.92 3.87 - 5.11 MIL/uL   Hemoglobin 14.7 12.0 - 15.0 g/dL   HCT 16.1 09.6 - 04.5 %   MCV 88.0 78.0 - 100.0 fL   MCH 29.9 26.0 - 34.0 pg   MCHC 33.9 30.0 - 36.0 g/dL   RDW 40.9 81.1 - 91.4 %   Platelets 333 150 - 400 K/uL    No results found.  Assessment/Plan: Symptomatic rectocele and cystocele Plan for Anterior  and posterior repair Desire for permanent sterility and desire for decreased risk of ovarian cancer- plan for lap BS  She understands the risks of surgery, including, but not to infection, bleeding, DVTs, damage to bowel, bladder, ureters. She wishes to proceed.     Heather [name] C. 08/04/2015, 11:39 AM

## 2015-08-05 ENCOUNTER — Encounter (HOSPITAL_COMMUNITY): Payer: Self-pay | Admitting: Obstetrics & Gynecology

## 2015-08-05 DIAGNOSIS — N8110 Cystocele, unspecified: Secondary | ICD-10-CM | POA: Diagnosis not present

## 2015-08-05 MED ORDER — FLAVOXATE HCL 100 MG PO TABS: 100.0000 mg | ORAL_TABLET | Freq: Three times a day (TID) | ORAL | Status: DC

## 2015-08-05 NOTE — Progress Notes (Signed)
Patient ambulated in hallway without difficulty.

## 2015-08-05 NOTE — Anesthesia Postprocedure Evaluation (Signed)
Anesthesia Post Note  Patient: Heather [name]  Procedure(s) Performed: Procedure(s) (LRB): LAPAROSCOPIC BILATERAL SALPINGECTOMY (Bilateral) ANTERIOR (CYSTOCELE) AND POSTERIOR REPAIR (RECTOCELE) (N/A)  Patient location during evaluation: [facility] Anesthesia Type: General Level of consciousness: awake Pain management: satisfactory to patient Vital Signs Assessment: post-procedure vital signs reviewed and stable Respiratory status: spontaneous breathing Cardiovascular status: stable Postop Assessment: no signs of nausea or vomiting Anesthetic complications: no    Last Vitals:  Filed Vitals:   08/05/15 0200 08/05/15 0627  BP: 103/64 105/53  Pulse: 70 66  Temp: 36.8 C 37 C  Resp: 16 18    Last Pain:  Filed Vitals:   08/05/15 0643  PainSc: 7                  Heather [name]

## 2015-08-05 NOTE — Discharge Instructions (Signed)
Laparoscopic Tubal Ligation, Care After Refer to this sheet in the next few weeks. These instructions provide you with information about caring for yourself after your procedure. Your health care provider may also give you more specific instructions. Your treatment has been planned according to current medical practices, but problems sometimes occur. Call your health care provider if you have any problems or questions after your procedure. WHAT TO EXPECT AFTER THE PROCEDURE After your procedure, it is common to have:  Sore throat.  Soreness at the incision site.  Mild cramping.  Tiredness.  Mild nausea or vomiting.  Shoulder pain. HOME CARE INSTRUCTIONS  Rest for the remainder of the day.  Take medicines only as directed by your health care provider. These include over-the-counter medicines and prescription medicines. Do not take aspirin, which can cause bleeding.  Over the next few days, gradually return to your normal activities and your normal diet.  Avoid sexual intercourse for 2 weeks or as directed by your health care provider.  Do not use tampons, and do not douche.  Do not drive or operate heavy machinery while taking pain medicine.  Do not lift anything that is heavier than 5 lb (2.3 kg) for 2 weeks or as directed by your health care provider.  Do not take baths. Take showers only. Ask your health care provider when you can start taking baths.  Take your temperature twice each day and write it down.  Try to have help for your household needs for the first 7-10 days.  There are many different ways to close and cover an incision, including stitches (sutures), skin glue, and adhesive strips. Follow instructions from your health care provider about:  Incision care.  Bandage (dressing) changes and removal.  Incision closure removal.  Check your incision area every day for signs of infection. Watch for:  Redness, swelling, or pain.  Fluid, blood, or pus.  Keep  all follow-up visits as directed by your health care provider. SEEK MEDICAL CARE IF:  You have redness, swelling, or increasing pain in your incision area.  You have fluid, blood, or pus coming from your incision for longer than 1 day.  You notice a bad smell coming from your incision or your dressing.  The edges of your incision break open after the sutures have been removed.  Your pain does not decrease after 2-3 days.  You have a rash.  You repeatedly become dizzy or light-headed.  You have a reaction to your medicine.  Your pain medicine is not helping.  You are constipated. SEEK IMMEDIATE MEDICAL CARE IF:  You have a fever.  You faint.  You have increasing pain in your abdomen.  You have severe pain in one or both of your shoulders.  You have bleeding or drainage from your suture sites or your vagina after surgery.  You have shortness of breath or have difficulty breathing.  You have chest pain or leg pain.  You have ongoing nausea, vomiting, or diarrhea.   This information is not intended to replace advice given to you by your health care provider. Make sure you discuss any questions you have with your health care provider.   Document Released: 03/11/2005 Document Revised: 01/06/2015 Document Reviewed: 12/03/2011 Elsevier Interactive Patient Education 2016 Elsevier Inc. Anterior and Posterior Colporrhaphy, Care After Refer to this sheet in the next few weeks. These instructions provide you with information on caring for yourself after your procedure. Your health care provider may also give you more specific instructions. Your treatment  has been planned according to current medical practices, but problems sometimes occur. Call your health care provider if you have any problems or questions after your procedure. HOME CARE INSTRUCTIONS   Take frequent rest periods throughout the day.   Only take over-the-counter or prescription medicines as directed by your  health care provider.   Avoid strenuous activity such as heavy lifting (more than 10 pounds [4.5 kg]), pushing, and pulling until your health care provider says it is okay.   Take showers if your health care provider approves. Pat incisions dry. Do not rub incisions with a washcloth or towel. Do not take tub baths until your health care provider approves.   Wear compression stockings as directed by your health care provider. These stockings help prevent blood clots from forming in your legs.   Talk with your health care provider about when you may return to work and your exercise routine.   Do not drive until your health care provider approves.   You may resume your normal diet. Eat a well-balanced diet.   Drink enough fluids to keep your urine clear or pale yellow.   Your normal bowel function should return. If you become constipated, you may:   Take a mild laxative.   Add fruit and bran to your diet.   Drink more fluids.  Do not have sexual intercourse until permitted by your health care provider.  Follow up with your health care provider as directed. SEEK MEDICAL CARE IF: You have persistent nausea or vomiting.  SEEK IMMEDIATE MEDICAL CARE IF:   You have increased bleeding (more than a small spot) from the vaginal area.   Your pain is not relieved with medicine or becomes worse.   You have redness, swelling, or increasing pain in the vaginal area.   You have abdominal pain.   You see pus coming from the wounds.   You develop a fever.   You have a foul smell coming from your vaginal area.   You develop light-headedness or you feel faint.   You have difficulty breathing.  MAKE SURE YOU:  Understand these instructions.  Will watch your condition.  Will get help right away if you are not doing well or get worse.    TAKE MIRALAX 17 GRAMS EVERY DAY TO PREVENT CONSTIPATION. REMOVE VAGINAL PACKING TOMORROW (WEDNESDAY) REMOVE FOLEY CATHETER ON  Friday MORNING.   This information is not intended to replace advice given to you by your health care provider. Make sure you discuss any questions you have with your health care provider.   Document Released: 03/10/2005 Document Revised: 04/24/2013 Document Reviewed: 01/11/2013 Elsevier Interactive Patient Education 2016 [employer]   Post Anesthesia Home Care Instructions  Activity: Get plenty of rest for the remainder of the day. A responsible adult should stay with you for 24 hours following the procedure.  For the next 24 hours, DO NOT: -Drive a car -Advertising copywriterperate machinery -Drink alcoholic beverages -Take any medication unless instructed by your physician -Make any legal decisions or sign important papers.  Meals: Start with liquid foods such as gelatin or soup. Progress to regular foods as tolerated. Avoid greasy, spicy, heavy foods. If nausea and/or vomiting occur, drink only clear liquids until the nausea and/or vomiting subsides. Call your physician if vomiting continues.  Special Instructions/Symptoms: Your throat may feel dry or sore from the anesthesia or the breathing tube placed in your throat during surgery. If this causes discomfort, gargle with warm salt water. The discomfort should disappear within 24  hours.  If you had a scopolamine patch placed behind your ear for the management of post- operative nausea and/or vomiting:  1. The medication in the patch is effective for 72 hours, after which it should be removed.  Wrap patch in a tissue and discard in the trash. Wash hands thoroughly with soap and water. 2. You may remove the patch earlier than 72 hours if you experience unpleasant side effects which may include dry mouth, dizziness or visual disturbances. 3. Avoid touching the patch. Wash your hands with soap and water after contact with the patch.   Anterior and Posterior Colporrhaphy, Care After Refer to this sheet in the next few weeks. These instructions provide  you with information on caring for yourself after your procedure. Your health care provider may also give you more specific instructions. Your treatment has been planned according to current medical practices, but problems sometimes occur. Call your health care provider if you have any problems or questions after your procedure. HOME CARE INSTRUCTIONS   Take frequent rest periods throughout the day.   Only take over-the-counter or prescription medicines as directed by your health care provider.   Avoid strenuous activity such as heavy lifting (more than 10 pounds [4.5 kg]), pushing, and pulling until your health care provider says it is okay.   Take showers if your health care provider approves. Pat incisions dry. Do not rub incisions with a washcloth or towel. Do not take tub baths until your health care provider approves.   Wear compression stockings as directed by your health care provider. These stockings help prevent blood clots from forming in your legs.   Talk with your health care provider about when you may return to work and your exercise routine.   Do not drive until your health care provider approves.   You may resume your normal diet. Eat a well-balanced diet.   Drink enough fluids to keep your urine clear or pale yellow.   Your normal bowel function should return. If you become constipated, you may:   Take a mild laxative.   Add fruit and bran to your diet.   Drink more fluids.  Do not have sexual intercourse until permitted by your health care provider.  Follow up with your health care provider as directed. SEEK MEDICAL CARE IF: You have persistent nausea or vomiting.  SEEK IMMEDIATE MEDICAL CARE IF:   You have increased bleeding (more than a small spot) from the vaginal area.   Your pain is not relieved with medicine or becomes worse.   You have redness, swelling, or increasing pain in the vaginal area.   You have abdominal pain.   You see  pus coming from the wounds.   You develop a fever.   You have a foul smell coming from your vaginal area.   You develop light-headedness or you feel faint.   You have difficulty breathing.  MAKE SURE YOU:  Understand these instructions.  Will watch your condition.  Will get help right away if you are not doing well or get worse.   This information is not intended to replace advice given to you by your health care provider. Make sure you discuss any questions you have with your health care provider.   Document Released: 03/10/2005 Document Revised: 04/24/2013 Document Reviewed: 01/11/2013 Elsevier Interactive Patient Education 2016 Elsevier Inc. Laparoscopic Tubal Ligation, Care After Refer to this sheet in the next few weeks. These instructions provide you with information about caring for yourself after your  procedure. Your health care provider may also give you more specific instructions. Your treatment has been planned according to current medical practices, but problems sometimes occur. Call your health care provider if you have any problems or questions after your procedure. WHAT TO EXPECT AFTER THE PROCEDURE After your procedure, it is common to have:  Sore throat.  Soreness at the incision site.  Mild cramping.  Tiredness.  Mild nausea or vomiting.  Shoulder pain. HOME CARE INSTRUCTIONS  Rest for the remainder of the day.  Take medicines only as directed by your health care provider. These include over-the-counter medicines and prescription medicines. Do not take aspirin, which can cause bleeding.  Over the next few days, gradually return to your normal activities and your normal diet.  Avoid sexual intercourse for 2 weeks or as directed by your health care provider.  Do not use tampons, and do not douche.  Do not drive or operate heavy machinery while taking pain medicine.  Do not lift anything that is heavier than 5 lb (2.3 kg) for 2 weeks or as  directed by your health care provider.  Do not take baths. Take showers only. Ask your health care provider when you can start taking baths.  Take your temperature twice each day and write it down.  Try to have help for your household needs for the first 7-10 days.  There are many different ways to close and cover an incision, including stitches (sutures), skin glue, and adhesive strips. Follow instructions from your health care provider about:  Incision care.  Bandage (dressing) changes and removal.  Incision closure removal.  Check your incision area every day for signs of infection. Watch for:  Redness, swelling, or pain.  Fluid, blood, or pus.  Keep all follow-up visits as directed by your health care provider. SEEK MEDICAL CARE IF:  You have redness, swelling, or increasing pain in your incision area.  You have fluid, blood, or pus coming from your incision for longer than 1 day.  You notice a bad smell coming from your incision or your dressing.  The edges of your incision break open after the sutures have been removed.  Your pain does not decrease after 2-3 days.  You have a rash.  You repeatedly become dizzy or light-headed.  You have a reaction to your medicine.  Your pain medicine is not helping.  You are constipated. SEEK IMMEDIATE MEDICAL CARE IF:  You have a fever.  You faint.  You have increasing pain in your abdomen.  You have severe pain in one or both of your shoulders.  You have bleeding or drainage from your suture sites or your vagina after surgery.  You have shortness of breath or have difficulty breathing.  You have chest pain or leg pain.  You have ongoing nausea, vomiting, or diarrhea.  DO NOT PUT ANYTHING IN YOUR VAGINA UNTIL 6 WEEKS POST OP REMOVE YOUR CATHETER Friday MORNING. IF YOU CANNOT VOID/PEE, COME BACK TO [facility]   This information is not intended to replace advice given to you by your health care provider. Make sure you  discuss any questions you have with your health care provider.   Document Released: 03/11/2005 Document Revised: 01/06/2015 Document Reviewed: 12/03/2011 Elsevier Interactive Patient Education [employer]

## 2015-08-05 NOTE — Progress Notes (Signed)

## 2015-08-05 NOTE — Addendum Note (Signed)
Addendum  created 08/05/15 0756 by Algis GreenhouseLinda A Lesta Limbert, CRNA   Modules edited: Clinical Notes   Clinical Notes:  File: [id number]

## 2015-08-05 NOTE — Discharge Summary (Signed)
Physician Discharge Summary  Patient ID: Heather [name] MRN: [id number] DOB/AGE: [birth year] 32 y.o.  Admit date: 08/04/2015 Discharge date: 08/05/2015  Admission Diagnoses: symptomatic rectocele/cystocele, desire for sterility  Discharge Diagnoses: same Active Problems:   Rectal prolapse   Discharged Condition: good  Hospital Course: She underwent an uncomplicated Lap BS, a & p repair. She initially wanted to go home but decided in the recovery room that she would like to stay overnight.  Consults: None  Significant Diagnostic Studies: none  Treatments: surgery: as above  Discharge Exam: Blood pressure 105/50, pulse 71, temperature 98.1 F (36.7 C), temperature source Oral, resp. rate 18, height 5\' 1"  (1.549 m), weight 63.957 kg (141 lb), last menstrual period 06/24/2015, SpO2 97 %, not currently breastfeeding. General appearance: alert Resp: clear to auscultation bilaterally Her vaginal packing was removed. It was stained with old blood.  Disposition: 01-Home or Self Care     Medication List    TAKE these medications        flavoxATE 100 MG tablet  Commonly known as:  URISPAS  Take 1 tablet (100 mg total) by mouth every 8 (eight) hours.     ibuprofen 600 MG tablet  Commonly known as:  ADVIL,MOTRIN  Take 1 tablet (600 mg total) by mouth every 6 (six) hours as needed.     Norethindrone Acetate-Ethinyl Estrad-FE 1-20 MG-MCG(24) tablet  Commonly known as:  LOESTRIN 24 FE  Take 1 tablet by mouth daily.     oxyCODONE-acetaminophen 5-325 MG tablet  Commonly known as:  PERCOCET/ROXICET  Take 1-2 tablets by mouth every 6 (six) hours as needed.     polyethylene glycol powder powder  Commonly known as:  GLYCOLAX/MIRALAX  Take 255 g by mouth once.           Follow-up Information    Follow up with Varie Machamer C., MD. Schedule an appointment as soon as possible for a visit in 6 weeks.   Specialty:  Obstetrics and Gynecology   Contact information:   [address] [place] [address] [phone]       Follow up with Tenlee Wollin C., MD. Schedule an appointment as soon as possible for a visit in 5 weeks.   Specialty:  Obstetrics and Gynecology   Contact information:   [address] [place] [address] [phone]       Signed: Allie BossierDOVE,Tahmid Stonehocker C. 08/05/2015, 12:17 PM

## 2015-08-28 ENCOUNTER — Telehealth: Payer: Self-pay | Admitting: *Deleted

## 2015-08-28 DIAGNOSIS — G8918 Other acute postprocedural pain: Secondary | ICD-10-CM

## 2015-08-28 MED ORDER — IBUPROFEN 600 MG PO TABS: 600.0000 mg | ORAL_TABLET | Freq: Four times a day (QID) | ORAL | Status: DC | PRN

## 2015-08-28 NOTE — Telephone Encounter (Signed)
Pt called requesting refill on Ibuprofen 600mg , Dr Macon LargeAnyanwu reviewed case, sent refill to pharmacy.

## 2015-09-08 ENCOUNTER — Encounter: Payer: Self-pay | Admitting: Obstetrics & Gynecology

## 2015-09-08 ENCOUNTER — Ambulatory Visit (INDEPENDENT_AMBULATORY_CARE_PROVIDER_SITE_OTHER): Payer: Medicaid Other | Admitting: Obstetrics & Gynecology

## 2015-09-08 VITALS — BP 115/73 | HR 70 | Wt 144.0 lb

## 2015-09-08 DIAGNOSIS — Z9889 Other specified postprocedural states: Secondary | ICD-10-CM

## 2015-09-08 DIAGNOSIS — N760 Acute vaginitis: Secondary | ICD-10-CM

## 2015-09-08 MED ORDER — FLUCONAZOLE 150 MG PO TABS: 150.0000 mg | ORAL_TABLET | Freq: Once | ORAL | Status: DC

## 2015-09-08 MED ORDER — METRONIDAZOLE 500 MG PO TABS: 500.0000 mg | ORAL_TABLET | Freq: Two times a day (BID) | ORAL | Status: DC

## 2015-09-08 NOTE — Progress Notes (Signed)
   Subjective:    Patient ID: Heather [name], female    DOB: 03-19-1981, 35 y.o.   MRN: [id number]  HPI 35 yo lady here 5 weeks after having a lap BS and anterior/posterior repair. She has not had sex. She reports normal bowel and bladder function.    Review of Systems     Objective:   Physical Exam  WNWHWFNAD Breathing, conversing, and ambulating normally Abd- benign Incisions- healed well Good support of her ant and post vaginal walls. I removed a small stitch from her perineum Vaginal discharge c/w BV      Assessment & Plan:  Post op-doing well RTC for annual in the spring Treat with flagyl Diflucan prn

## 2015-11-05 ENCOUNTER — Telehealth: Payer: Self-pay | Admitting: *Deleted

## 2015-11-05 DIAGNOSIS — G43709 Chronic migraine without aura, not intractable, without status migrainosus: Secondary | ICD-10-CM

## 2015-11-05 MED ORDER — IBUPROFEN 800 MG PO TABS: 800.0000 mg | ORAL_TABLET | Freq: Three times a day (TID) | ORAL | Status: DC | PRN

## 2015-11-05 NOTE — Telephone Encounter (Signed)
Pt has hx migraine headaches, was using Ibuprofen  but states the  tablets work better when she is experiencing a migraine.  Sent rx for Ibuprofen  to pharmacy per Dr Marice Potter order.

## 2015-11-05 NOTE — Telephone Encounter (Signed)
-----   Message from Olevia Bowens sent at 11/05/2015 11:09 AM EST ----- Regarding: Rx Request Contact: [phone] Wants a Rx for  Ibuprofen Uses [facility] on [address]

## 2015-12-10 ENCOUNTER — Ambulatory Visit: Payer: Medicaid Other | Admitting: Obstetrics & Gynecology

## 2015-12-25 ENCOUNTER — Encounter: Payer: Self-pay | Admitting: Obstetrics & Gynecology

## 2015-12-25 ENCOUNTER — Ambulatory Visit (INDEPENDENT_AMBULATORY_CARE_PROVIDER_SITE_OTHER): Payer: Medicaid Other | Admitting: Obstetrics & Gynecology

## 2015-12-25 ENCOUNTER — Other Ambulatory Visit (HOSPITAL_COMMUNITY)
Admission: RE | Admit: 2015-12-25 | Discharge: 2015-12-25 | Disposition: A | Discharge status: Home / Self Care | Payer: Medicaid Other | Source: Ambulatory Visit | Attending: Obstetrics & Gynecology | Admitting: Obstetrics & Gynecology

## 2015-12-25 VITALS — BP 125/82 | HR 86 | Resp 18 | Ht 61.5 in | Wt 143.0 lb

## 2015-12-25 DIAGNOSIS — Z01419 Encounter for gynecological examination (general) (routine) without abnormal findings: Secondary | ICD-10-CM | POA: Insufficient documentation

## 2015-12-25 DIAGNOSIS — Z1151 Encounter for screening for human papillomavirus (HPV): Secondary | ICD-10-CM | POA: Insufficient documentation

## 2015-12-25 DIAGNOSIS — Z124 Encounter for screening for malignant neoplasm of cervix: Secondary | ICD-10-CM | POA: Diagnosis not present

## 2015-12-25 DIAGNOSIS — R635 Abnormal weight gain: Secondary | ICD-10-CM | POA: Diagnosis not present

## 2015-12-25 DIAGNOSIS — Z0000 Encounter for general adult medical examination without abnormal findings: Secondary | ICD-10-CM | POA: Diagnosis not present

## 2015-12-25 LAB — CBC
HCT: 39 % (ref 35.0–45.0)
Hemoglobin: 12.6 g/dL (ref 11.7–15.5)
MCH: 28.3 pg (ref 27.0–33.0)
MCHC: 32.3 g/dL (ref 32.0–36.0)
MCV: 87.6 fL (ref 80.0–100.0)
MPV: 8.7 fL (ref 7.5–12.5)
PLATELETS: 349 10*3/uL (ref 140–400)
RBC: 4.45 MIL/uL (ref 3.80–5.10)
RDW: 14.3 % (ref 11.0–15.0)
WBC: 7.5 10*3/uL (ref 3.8–10.8)

## 2015-12-25 LAB — LIPID PANEL
CHOL/HDL RATIO: 2.5 ratio (ref <=5.0)
Cholesterol: 111 mg/dL — ABNORMAL LOW (ref 125–200)
HDL: 44 mg/dL — ABNORMAL LOW (ref >=46)
LDL Cholesterol: 55 mg/dL (ref <130)
Triglycerides: 58 mg/dL (ref <150)
VLDL: 12 mg/dL (ref <30)

## 2015-12-25 LAB — COMPREHENSIVE METABOLIC PANEL
ALBUMIN: 4.1 g/dL (ref 3.6–5.1)
ALT: 8 U/L (ref 6–29)
AST: 10 U/L (ref 10–30)
Alkaline Phosphatase: 62 U/L (ref 33–115)
BILIRUBIN TOTAL: 0.2 mg/dL (ref 0.2–1.2)
BUN: 14 mg/dL (ref 7–25)
CO2: 22 mmol/L (ref 20–31)
CREATININE: 0.56 mg/dL (ref 0.50–1.10)
Calcium: 8.7 mg/dL (ref 8.6–10.2)
Chloride: 107 mmol/L (ref 98–110)
Glucose, Bld: 66 mg/dL (ref 65–99)
Potassium: 3.5 mmol/L (ref 3.5–5.3)
SODIUM: 140 mmol/L (ref 135–146)
TOTAL PROTEIN: 6.4 g/dL (ref 6.1–8.1)

## 2015-12-25 LAB — TSH: TSH: 1.75 m[IU]/L

## 2015-12-25 NOTE — Progress Notes (Signed)
Subjective:    Lawanda CousinsCara Fields is a 35 y.o. DW P4 female who presents for an annual exam. The patient has no complaints today except that during sex she does not feel the nipple involvement that she did 2 years ago. The patient is sexually active. GYN screening history: last pap: was normal. The patient wears seatbelts: yes. The patient participates in regular exercise: yes. Has the patient ever been transfused or tattooed?: no. The patient reports that there is not domestic violence in her life.   Menstrual History: OB History    Gravida Para Term Preterm AB TAB SAB Ectopic Multiple Living   4 4 4       0 4      Menarche age: [age]  [name] last menstrual period was 12/08/2015.    The following portions of the patient's history were reviewed and updated as appropriate: allergies, current medications, past family history, past medical history, past social history, past surgical history and problem list.  Review of Systems Pertinent items are noted in HPI.    Objective:    BP 125/82 mmHg  Pulse 86  Resp 18  Ht 5' 1.5" (1.562 m)  Wt 143 lb (64.864 kg)  BMI 26.59 kg/m2  LMP 12/08/2015  General Appearance:    Alert, cooperative, no distress, appears stated age  Head:    Normocephalic, without obvious abnormality, atraumatic  Eyes:    PERRL, conjunctiva/corneas clear, EOM's intact, fundi    benign, both eyes  Ears:    Normal TM's and external ear canals, both ears  Nose:   Nares normal, septum midline, mucosa normal, no drainage    or sinus tenderness  Throat:   Lips, mucosa, and tongue normal; teeth and gums normal  Neck:   Supple, symmetrical, trachea midline, no adenopathy;    thyroid:  no enlargement/tenderness/nodules; no carotid   bruit or JVD  Back:     Symmetric, no curvature, ROM normal, no CVA tenderness  Lungs:     Clear to auscultation bilaterally, respirations unlabored  Chest Wall:    No tenderness or deformity   Heart:    Regular rate and rhythm, S1 and S2 normal, no  murmur, rub   or gallop  Breast Exam:    No tenderness, masses, or nipple abnormality  Abdomen:     Soft, non-tender, bowel sounds active all four quadrants,    no masses, no organomegaly  Genitalia:    Normal female without lesion, discharge or tenderness, NSSA, NT, normal adnexal exam     Extremities:   Extremities normal, atraumatic, no cyanosis or edema  Pulses:   2+ and symmetric all extremities  Skin:   Skin color, texture, turgor normal, no rashes or lesions  Lymph nodes:   Cervical, supraclavicular, and axillary nodes normal  Neurologic:   CNII-XII intact, normal strength, sensation and reflexes    throughout  .    Assessment:    Healthy female exam.   Weight gain   Plan:     Thin prep Pap smear. fasting labs   TSH

## 2015-12-26 LAB — VITAMIN D 25 HYDROXY (VIT D DEFICIENCY, FRACTURES): Vit D, 25-Hydroxy: 17 ng/mL — ABNORMAL LOW (ref 30–100)

## 2015-12-28 LAB — CYTOLOGY - PAP

## 2015-12-29 ENCOUNTER — Telehealth: Payer: Self-pay | Admitting: *Deleted

## 2015-12-29 DIAGNOSIS — R7989 Other specified abnormal findings of blood chemistry: Secondary | ICD-10-CM

## 2015-12-29 MED ORDER — VITAMIN D (ERGOCALCIFEROL) 1.25 MG (50000 UNIT) PO CAPS: 50000.0000 [IU] | ORAL_CAPSULE | ORAL | Status: DC

## 2015-12-29 NOTE — Telephone Encounter (Signed)
Informed pt of Vit D result and to start weekly Vit D supplement for 8 wks, after completing the eighth week pt is to schedule rpt Vit D level. Sent rx to pharmacy.

## 2015-12-29 NOTE — Telephone Encounter (Signed)
Called pt, no answer, unable to leave message.

## 2015-12-29 NOTE — Telephone Encounter (Signed)
-----   Message from Allie BossierMyra C Dove, MD sent at 12/29/2015  3:54 PM EDT ----- She will need vitamin D 50,000 units weekly for 8 weeks and then a retest of her vitamin D level. Thanks

## 2016-02-22 ENCOUNTER — Encounter: Payer: Self-pay | Admitting: Obstetrics & Gynecology

## 2016-02-22 ENCOUNTER — Ambulatory Visit: Payer: Medicaid Other | Admitting: Obstetrics & Gynecology

## 2016-02-22 VITALS — BP 112/68 | HR 80 | Resp 16 | Ht 61.0 in | Wt 141.0 lb

## 2016-02-22 DIAGNOSIS — E559 Vitamin D deficiency, unspecified: Secondary | ICD-10-CM

## 2016-02-22 NOTE — Progress Notes (Signed)
   Subjective:    Patient ID: Heather [name], female    DOB: 12-24-1980, 35 y.o.   MRN: [id number]  HPI She is here to have Vit D level drawn. Was mistakenly put on my schedule. Worried about her weight.   Review of Systems     Objective:   Physical Exam WNWHWFNAD Breathing, conversing, and ambulating normally      Assessment & Plan:  H/o low vit D- re check today and retreat prn Weight gain- [facility]

## 2016-02-23 ENCOUNTER — Telehealth: Payer: Self-pay | Admitting: *Deleted

## 2016-02-23 LAB — VITAMIN D 25 HYDROXY (VIT D DEFICIENCY, FRACTURES): VIT D 25 HYDROXY: 53 ng/mL (ref 30–100)

## 2016-02-23 NOTE — Telephone Encounter (Signed)
Spoke to pt about lab results. Advised pt to start OTC Vit D 1000 IU daily to maintain Vit D level. Also, gave pt the number to make an appt with [facility]. Pt expressed understanding.

## 2016-02-23 NOTE — Telephone Encounter (Signed)
-----   Message from Olevia BowensJacinda S Battle sent at 02/23/2016 10:26 AM EDT ----- Regarding: Lab Rresults Contact: [phone] Wants test results from yesterday's visit

## 2016-05-21 ENCOUNTER — Ambulatory Visit (INDEPENDENT_AMBULATORY_CARE_PROVIDER_SITE_OTHER): Payer: Medicaid Other

## 2016-05-21 ENCOUNTER — Encounter (HOSPITAL_COMMUNITY): Payer: Self-pay | Admitting: Emergency Medicine

## 2016-05-21 ENCOUNTER — Ambulatory Visit (HOSPITAL_COMMUNITY)
Admission: EM | Admit: 2016-05-21 | Discharge: 2016-05-21 | Disposition: A | Discharge status: Home / Self Care | Payer: Medicaid Other | Attending: Family Medicine | Admitting: Family Medicine

## 2016-05-21 DIAGNOSIS — S99921A Unspecified injury of right foot, initial encounter: Secondary | ICD-10-CM

## 2016-05-21 MED ORDER — HYDROCODONE-ACETAMINOPHEN 5-325 MG PO TABS
ORAL_TABLET | ORAL | Status: AC
  Filled 2016-05-21: qty 1

## 2016-05-21 MED ORDER — HYDROCODONE-ACETAMINOPHEN 5-325 MG PO TABS
1.0000 | ORAL_TABLET | Freq: Once | ORAL | Status: AC
Start: 2016-05-21 — End: 2016-05-21
  Administered 2016-05-21: 1 via ORAL

## 2016-05-21 MED ORDER — HYDROCODONE-ACETAMINOPHEN 5-325 MG PO TABS: 1.0000 | ORAL_TABLET | ORAL | 0 refills | Status: DC | PRN

## 2016-05-21 NOTE — Discharge Instructions (Signed)
Rest ice and elevate affected extremity. Call orthopedics for follow up

## 2016-05-21 NOTE — ED Triage Notes (Signed)
Pt reports she fell going down some steps and twisted left foot/ankle this morning  Sx include: swelling and unable to bear wt.... Brought back on wheelchair.   Has a small abrasion to right knee  A&O x4... NAD

## 2016-05-21 NOTE — ED Provider Notes (Signed)
CSN: [id number]     Arrival date & time 05/21/16  1633 History   None    Chief Complaint  Patient presents with  . Ankle Injury   (Consider location/radiation/quality/duration/timing/severity/associated sxs/prior Treatment) 35 y.o. female presents with right foot pain after falling down 3 brick steps while holding boxes. Patient reports abraison to left knee but denies any other injuries or loss of consciousness. Patient states that she is unable to bear weight on the affected foot. Swelling noted to dorsal aspect. Not bruising or hematoma. Dorsalis pedal pulse noted. Patient denies any change in sensation. Cap refill unable to be evaluated due to toe nail polish.       Past Medical History:  Diagnosis Date  . Anemia    History of  . Anxiety    stopped meds with pregnancy  . Paresthesia of both feet   . Tobacco abuse    Past Surgical History:  Procedure Laterality Date  . ANTERIOR AND POSTERIOR REPAIR N/A 08/04/2015   Procedure: ANTERIOR (CYSTOCELE) AND POSTERIOR REPAIR (RECTOCELE);  Surgeon: Allie Bossier, MD;  Location: [facility];  Service: Gynecology;  Laterality: N/A;  . HERNIA REPAIR     when 8 wks old  . LAPAROSCOPIC BILATERAL SALPINGECTOMY Bilateral 08/04/2015   Procedure: LAPAROSCOPIC BILATERAL SALPINGECTOMY;  Surgeon: Allie Bossier, MD;  Location: [facility];  Service: Gynecology;  Laterality: Bilateral;  . WISDOM TOOTH EXTRACTION     Family History  Problem Relation Age of Onset  . Neuropathy Mother   . Thyroid disease Father   . Alcohol abuse Neg Hx   . Arthritis Neg Hx   . Asthma Neg Hx   . Birth defects Neg Hx   . Cancer Neg Hx   . COPD Neg Hx   . Depression Neg Hx   . Diabetes Neg Hx   . Drug abuse Neg Hx   . Early death Neg Hx   . Hearing loss Neg Hx   . Heart disease Neg Hx   . Hyperlipidemia Neg Hx   . Hypertension Neg Hx   . Kidney disease Neg Hx   . Learning disabilities Neg Hx   . Mental illness Neg Hx   . Mental retardation Neg Hx   . Miscarriages /  Stillbirths Neg Hx   . Stroke Neg Hx   . Vision loss Neg Hx   . Varicose Veins Neg Hx    Social History  Substance Use Topics  . Smoking status: Current Every Day Smoker    Packs/day: 1.00    Years: 20.00    Types: Cigarettes  . Smokeless tobacco: Never Used  . Alcohol use Yes     Comment: rare   OB History    Gravida Para Term Preterm AB Living   4 4 4     4    SAB TAB Ectopic Multiple Live Births         0 4     Review of Systems  Allergies  Review of patient's allergies indicates no known allergies.  Home Medications   Prior to Admission medications   Medication Sig Start Date End Date Taking? Authorizing Provider  ibuprofen (ADVIL,MOTRIN) 800 MG tablet Take 1 tablet (800 mg total) by mouth every 8 (eight) hours as needed. 11/05/15  Yes Allie Bossier, MD  HYDROcodone-acetaminophen (NORCO/VICODIN) 5-325 MG tablet Take 1 tablet by mouth every 4 (four) hours as needed. 05/21/16   Alene Mires, NP  Vitamin D, Ergocalciferol, (DRISDOL) 50000 units CAPS capsule Take 1  capsule (50,000 Units total) by mouth every 7 (seven) days. For eight weeks 12/29/15   Allie BossierMyra C Dove, MD   Meds Ordered and Administered this Visit   Medications  HYDROcodone-acetaminophen (NORCO/VICODIN) 5-325 MG per tablet 1 tablet (not administered)    BP 106/58 (BP Location: Left Arm)   Pulse 83   Temp 98.7 F (37.1 C) (Oral)   Resp 18   LMP 04/24/2016   SpO2 100%  No data found.   Physical Exam  Urgent Care Course   Clinical Course    Procedures (including critical care time)  Labs Review Labs Reviewed - No data to display  Imaging Review Dg Foot Complete Right  Result Date: 05/21/2016 CLINICAL DATA:  Fall down 3 stairs.  Right foot pain. EXAM: RIGHT FOOT COMPLETE - 3+ VIEW COMPARISON:  None. FINDINGS: No fracture, dislocation or suspicious focal osseous lesion. Accessory navicular. Bipartite medial first metatarsal sesamoid. No appreciable degenerative or erosive arthropathy. Small  plantar right calcaneal spur. No radiopaque foreign body. IMPRESSION: No fracture or malalignment. Electronically Signed   By: Delbert PhenixJason A Poff M.D.   On: 05/21/2016 18:18     Visual Acuity Review  Right Eye Distance:   Left Eye Distance:   Bilateral Distance:    Right Eye Near:   Left Eye Near:    Bilateral Near:      Review of xray shows no fracture or dislocation   Patient requesting pain medication prior to discharge. Patient has driver.    MDM   1. Foot injury, right, initial encounter       Alene MiresJennifer C Manasseh Pittsley, NP 05/21/16 1856

## 2016-10-10 ENCOUNTER — Telehealth: Payer: Self-pay | Admitting: *Deleted

## 2016-10-10 ENCOUNTER — Other Ambulatory Visit: Payer: Self-pay | Admitting: *Deleted

## 2016-10-10 DIAGNOSIS — N921 Excessive and frequent menstruation with irregular cycle: Secondary | ICD-10-CM

## 2016-10-10 MED ORDER — NORGESTIMATE-ETH ESTRADIOL 0.25-35 MG-MCG PO TABS: 1.0000 | ORAL_TABLET | Freq: Every day | ORAL | 4 refills | Status: DC

## 2016-10-10 NOTE — Telephone Encounter (Signed)
Pt called in stating she has had irregular and heavier cycles since having salpingectomy. Sent Sprintec to pharmacy per dr Shawnie Ponspratt. Explained to pt to skip placebos to skip cycle.

## 2016-10-13 ENCOUNTER — Other Ambulatory Visit: Payer: Self-pay | Admitting: *Deleted

## 2016-10-13 DIAGNOSIS — N921 Excessive and frequent menstruation with irregular cycle: Secondary | ICD-10-CM

## 2016-10-13 MED ORDER — NORGESTIMATE-ETH ESTRADIOL 0.25-35 MG-MCG PO TABS: 1.0000 | ORAL_TABLET | Freq: Every day | ORAL | 4 refills | Status: DC

## 2016-11-29 ENCOUNTER — Telehealth: Payer: Self-pay | Admitting: *Deleted

## 2016-11-29 NOTE — Telephone Encounter (Signed)
Pt called in stating she has started her 3 pack of continuous OCP and has been bleeding for 8 days. She states she thinks it could be slowing down now. She does not want to come in office due to not having any insurance. Explained to pt that she should continue this OCP pack and then take sugar pills to have a full cycle before starting new pack continuously. Pt expressed understanding.

## 2017-04-23 ENCOUNTER — Emergency Department (HOSPITAL_COMMUNITY)
Admission: EM | Admit: 2017-04-23 | Discharge: 2017-04-24 | Disposition: A | Discharge status: Home / Self Care | Payer: Medicaid Other | Attending: Emergency Medicine | Admitting: Emergency Medicine

## 2017-04-23 ENCOUNTER — Emergency Department (HOSPITAL_COMMUNITY): Payer: Medicaid Other

## 2017-04-23 ENCOUNTER — Encounter (HOSPITAL_COMMUNITY): Payer: Self-pay | Admitting: Emergency Medicine

## 2017-04-23 DIAGNOSIS — F17210 Nicotine dependence, cigarettes, uncomplicated: Secondary | ICD-10-CM | POA: Insufficient documentation

## 2017-04-23 DIAGNOSIS — Z79899 Other long term (current) drug therapy: Secondary | ICD-10-CM | POA: Insufficient documentation

## 2017-04-23 DIAGNOSIS — R519 Headache, unspecified: Secondary | ICD-10-CM

## 2017-04-23 DIAGNOSIS — J189 Pneumonia, unspecified organism: Secondary | ICD-10-CM | POA: Insufficient documentation

## 2017-04-23 DIAGNOSIS — J181 Lobar pneumonia, unspecified organism: Secondary | ICD-10-CM

## 2017-04-23 DIAGNOSIS — R51 Headache: Secondary | ICD-10-CM | POA: Insufficient documentation

## 2017-04-23 DIAGNOSIS — R5081 Fever presenting with conditions classified elsewhere: Secondary | ICD-10-CM

## 2017-04-23 LAB — COMPREHENSIVE METABOLIC PANEL
ALBUMIN: 3.8 g/dL (ref 3.5–5.0)
ALT: 15 U/L (ref 14–54)
ANION GAP: 11 (ref 5–15)
AST: 19 U/L (ref 15–41)
Alkaline Phosphatase: 70 U/L (ref 38–126)
BUN: 10 mg/dL (ref 6–20)
CHLORIDE: 102 mmol/L (ref 101–111)
CO2: 20 mmol/L — AB (ref 22–32)
Calcium: 8.6 mg/dL — ABNORMAL LOW (ref 8.9–10.3)
Creatinine, Ser: 0.69 mg/dL (ref 0.44–1.00)
GFR calc Af Amer: >60 mL/min (ref >60)
GFR calc non Af Amer: >60 mL/min (ref >60)
GLUCOSE: 123 mg/dL — AB (ref 65–99)
POTASSIUM: 3.1 mmol/L — AB (ref 3.5–5.1)
SODIUM: 133 mmol/L — AB (ref 135–145)
TOTAL PROTEIN: 6.7 g/dL (ref 6.5–8.1)
Total Bilirubin: 0.8 mg/dL (ref 0.3–1.2)

## 2017-04-23 LAB — CBC WITH DIFFERENTIAL/PLATELET
BASOS ABS: 0 10*3/uL (ref 0.0–0.1)
Basophils Relative: 0 %
Eosinophils Absolute: 0 10*3/uL (ref 0.0–0.7)
Eosinophils Relative: 0 %
HEMATOCRIT: 39 % (ref 36.0–46.0)
Hemoglobin: 13.6 g/dL (ref 12.0–15.0)
LYMPHS ABS: 0.8 10*3/uL (ref 0.7–4.0)
LYMPHS PCT: 4 %
MCH: 30.4 pg (ref 26.0–34.0)
MCHC: 34.9 g/dL (ref 30.0–36.0)
MCV: 87.1 fL (ref 78.0–100.0)
MONO ABS: 1.7 10*3/uL — AB (ref 0.1–1.0)
MONOS PCT: 8 %
NEUTROS ABS: 18.9 10*3/uL — AB (ref 1.7–7.7)
Neutrophils Relative %: 88 %
Platelets: 276 10*3/uL (ref 150–400)
RBC: 4.48 MIL/uL (ref 3.87–5.11)
RDW: 13.1 % (ref 11.5–15.5)
WBC: 21.4 10*3/uL — ABNORMAL HIGH (ref 4.0–10.5)

## 2017-04-23 LAB — URINALYSIS, ROUTINE W REFLEX MICROSCOPIC
BILIRUBIN URINE: NEGATIVE
GLUCOSE, UA: 150 mg/dL — AB
Ketones, ur: 20 mg/dL — AB
Nitrite: NEGATIVE
PH: 6 (ref 5.0–8.0)
Protein, ur: 100 mg/dL — AB
SPECIFIC GRAVITY, URINE: 1.026 (ref 1.005–1.030)

## 2017-04-23 LAB — I-STAT CG4 LACTIC ACID, ED
LACTIC ACID, VENOUS: 1.24 mmol/L (ref 0.5–1.9)
LACTIC ACID, VENOUS: 1.08 mmol/L (ref 0.5–1.9)

## 2017-04-23 MED ORDER — POTASSIUM CHLORIDE CRYS ER 20 MEQ PO TBCR
40.0000 meq | EXTENDED_RELEASE_TABLET | Freq: Once | ORAL | Status: AC
  Administered 2017-04-24: 40 meq via ORAL
  Filled 2017-04-23: qty 2

## 2017-04-23 MED ORDER — DOXYCYCLINE HYCLATE 100 MG PO TABS
100.0000 mg | ORAL_TABLET | Freq: Once | ORAL | Status: AC
  Administered 2017-04-24: 100 mg via ORAL
  Filled 2017-04-23: qty 1

## 2017-04-23 MED ORDER — ACETAMINOPHEN 325 MG PO TABS
650.0000 mg | ORAL_TABLET | Freq: Once | ORAL | Status: AC
  Administered 2017-04-24: 650 mg via ORAL
  Filled 2017-04-23: qty 2

## 2017-04-23 MED ORDER — ONDANSETRON HCL 4 MG/2ML IJ SOLN
4.0000 mg | Freq: Once | INTRAMUSCULAR | Status: AC
  Administered 2017-04-23: 4 mg via INTRAVENOUS
  Filled 2017-04-23: qty 2

## 2017-04-23 MED ORDER — KETOROLAC TROMETHAMINE 30 MG/ML IJ SOLN
30.0000 mg | Freq: Once | INTRAMUSCULAR | Status: AC
  Administered 2017-04-23: 30 mg via INTRAVENOUS
  Filled 2017-04-23: qty 1

## 2017-04-23 MED ORDER — SODIUM CHLORIDE 0.9 % IV BOLUS (SEPSIS)
1000.0000 mL | Freq: Once | INTRAVENOUS | Status: AC
  Administered 2017-04-24: 1000 mL via INTRAVENOUS

## 2017-04-23 MED ORDER — ACETAMINOPHEN 325 MG PO TABS
ORAL_TABLET | ORAL | Status: AC
  Filled 2017-04-23: qty 2

## 2017-04-23 MED ORDER — DIPHENHYDRAMINE HCL 50 MG/ML IJ SOLN
25.0000 mg | Freq: Once | INTRAMUSCULAR | Status: AC
  Administered 2017-04-24: 25 mg via INTRAVENOUS
  Filled 2017-04-23: qty 1

## 2017-04-23 MED ORDER — ACETAMINOPHEN 325 MG PO TABS
650.0000 mg | ORAL_TABLET | Freq: Once | ORAL | Status: AC | PRN
Start: 2017-04-23 — End: 2017-04-23
  Administered 2017-04-23: 650 mg via ORAL

## 2017-04-23 MED ORDER — PROCHLORPERAZINE EDISYLATE 5 MG/ML IJ SOLN
5.0000 mg | Freq: Once | INTRAMUSCULAR | Status: AC
  Administered 2017-04-24: 5 mg via INTRAVENOUS
  Filled 2017-04-23: qty 2

## 2017-04-23 NOTE — ED Triage Notes (Signed)
Pt reports fever and generalized body aches that began this am, pt reports she has found multiple ticks on her recently, last one was 1 week ago, fever over 102 at home, took otc meds. Pt a/ox4, resp e/u, nad.

## 2017-04-23 NOTE — ED Provider Notes (Signed)
[facility]ENCY DEPT Provider Note   CSN: [id number] Arrival date & time: 04/23/17  1857     History   Chief Complaint Chief Complaint  Patient presents with  . Fever  . Generalized Body Aches    HPI Heather [name] is a 36 y.o. female who presents with 2 week history of cough, one-day history of fever and body aches, and back pain. Patient reports a productive cough for the past 2 weeks. She has had some associated shortness of breath and chest pain with coughing. She then noted a fever up to 102 that started yesterday. She has had associated body aches. She began having back pain after "rough sex." Patient does have history of sciatica for the past 6 years flares up intermittently. Patient denies any numbness or tingling or bowel or bladder incontinence. Patient also notes that she has pulled several attached ticks off of her in the past 2 weeks. She is concerned about tick borne illness. She has had associated mild headache over the past 4 days. She has also had intermittent abdominal cramping, nausea, but no vomiting. Patient has taken Tylenol, ibuprofen at home without relief of her symptoms.  HPI  Past Medical History:  Diagnosis Date  . Anemia    History of  . Anxiety    stopped meds with pregnancy  . Paresthesia of both feet   . Tobacco abuse     Patient Active Problem List   Diagnosis Date Noted  . Current every day smoker 08/22/2013  . Depression with anxiety 01/06/2012  . Migraines 01/06/2012    Past Surgical History:  Procedure Laterality Date  . ANTERIOR AND POSTERIOR REPAIR N/A 08/04/2015   Procedure: ANTERIOR (CYSTOCELE) AND POSTERIOR REPAIR (RECTOCELE);  Surgeon: Allie Bossier, MD;  Location: [facility];  Service: Gynecology;  Laterality: N/A;  . HERNIA REPAIR     when 8 wks old  . LAPAROSCOPIC BILATERAL SALPINGECTOMY Bilateral 08/04/2015   Procedure: LAPAROSCOPIC BILATERAL SALPINGECTOMY;  Surgeon: Allie Bossier, MD;  Location: [facility];  Service: Gynecology;   Laterality: Bilateral;  . WISDOM TOOTH EXTRACTION      OB History    Gravida Para Term Preterm AB Living   4 4 4     4    SAB TAB Ectopic Multiple Live Births         0 4       Home Medications    Prior to Admission medications   Medication Sig Start Date End Date Taking? Authorizing Provider  Aspirin-Acetaminophen (GOODYS BODY PAIN PO) Take by mouth.   Yes [provider]  acetaminophen (TYLENOL) 500 MG tablet Take 1 tablet (500 mg total) by mouth every 6 (six) hours as needed. 04/24/17   Emi Holes, PA-C  benzonatate (TESSALON) 100 MG capsule Take 1 capsule (100 mg total) by mouth every 8 (eight) hours. 04/24/17   Emi Holes, PA-C  cyclobenzaprine (FLEXERIL) 10 MG tablet Take 1 tablet (10 mg total) by mouth 2 (two) times daily as needed for muscle spasms. 04/24/17   Emi Holes, PA-C  doxycycline (VIBRAMYCIN) 100 MG capsule Take 1 capsule (100 mg total) by mouth 2 (two) times daily. 04/24/17   Emi Holes, PA-C  ibuprofen (ADVIL,MOTRIN) 800 MG tablet Take 1 tablet (800 mg total) by mouth 3 (three) times daily. 04/24/17   Emi Holes, PA-C  norgestimate-ethinyl estradiol (ORTHO-CYCLEN,SPRINTEC,PREVIFEM) 0.25-35 MG-MCG tablet Take 1 tablet by mouth daily. Skip placebos Patient not taking: Reported on 04/23/2017 10/13/16   Tinnie Gens  S, MD  Vitamin D, Ergocalciferol, (DRISDOL) 50000 units CAPS capsule Take 1 capsule (50,000 Units total) by mouth every 7 (seven) days. For eight weeks Patient not taking: Reported on 04/23/2017 12/29/15   Allie Bossier, MD    Family History Family History  Problem Relation Age of Onset  . Neuropathy Mother   . Thyroid disease Father   . Alcohol abuse Neg Hx   . Arthritis Neg Hx   . Asthma Neg Hx   . Birth defects Neg Hx   . Cancer Neg Hx   . COPD Neg Hx   . Depression Neg Hx   . Diabetes Neg Hx   . Drug abuse Neg Hx   . Early death Neg Hx   . Hearing loss Neg Hx   . Heart disease Neg Hx   . Hyperlipidemia Neg Hx     . Hypertension Neg Hx   . Kidney disease Neg Hx   . Learning disabilities Neg Hx   . Mental illness Neg Hx   . Mental retardation Neg Hx   . Miscarriages / Stillbirths Neg Hx   . Stroke Neg Hx   . Vision loss Neg Hx   . Varicose Veins Neg Hx     Social History Social History  Substance Use Topics  . Smoking status: Current Every Day Smoker    Packs/day: 1.00    Years: 20.00    Types: Cigarettes  . Smokeless tobacco: Never Used  . Alcohol use Yes     Comment: rare     Allergies   Patient has no known allergies.   Review of Systems Review of Systems  Constitutional: Positive for fever. Negative for chills.  HENT: Positive for congestion. Negative for facial swelling and sore throat.   Respiratory: Positive for cough and shortness of breath.   Cardiovascular: Negative for chest pain.  Gastrointestinal: Positive for abdominal pain and nausea. Negative for blood in stool, constipation, diarrhea and vomiting.  Genitourinary: Negative for dysuria.  Musculoskeletal: Negative for back pain.  Skin: Negative for rash and wound.  Neurological: Positive for headaches.  Psychiatric/Behavioral: The patient is not nervous/anxious.      Physical Exam Updated Vital Signs BP 108/64 (BP Location: Right Arm)   Pulse 87   Temp 99.9 F (37.7 C) (Oral)   Resp 18   Ht 5\' 2"  (1.575 m)   Wt 74.8 kg (165 lb)   SpO2 100%   BMI 30.18 kg/m   Physical Exam  Constitutional: She appears well-developed and well-nourished. No distress.  HENT:  Head: Normocephalic and atraumatic.  Mouth/Throat: Oropharynx is clear and moist. No oropharyngeal exudate.  Eyes: Pupils are equal, round, and reactive to light. Conjunctivae and EOM are normal. Right eye exhibits no discharge. Left eye exhibits no discharge. No scleral icterus.  Neck: Normal range of motion. Neck supple. No neck rigidity. No thyromegaly present.  Patient ranging neck chin to chest and back and forth without difficulty   Cardiovascular: Normal rate, regular rhythm, normal heart sounds and intact distal pulses.  Exam reveals no gallop and no friction rub.   No murmur heard. Pulmonary/Chest: Effort normal and breath sounds normal. No stridor. No respiratory distress. She has no wheezes. She has no rales.  Abdominal: Soft. Bowel sounds are normal. She exhibits no distension. There is no tenderness. There is no rebound and no guarding.  Musculoskeletal: She exhibits no edema.  Lymphadenopathy:    She has no cervical adenopathy.  Neurological: She is alert. Coordination normal.  CN 3-12 intact; normal sensation throughout; 5/5 strength in all 4 extremities; equal bilateral grip strength; no ataxia on finger-to-nose  Skin: Skin is warm and dry. No rash noted. She is not diaphoretic. No pallor.  Psychiatric: She has a normal mood and affect.  Nursing note and vitals reviewed.    ED Treatments / Results  Labs (all labs ordered are listed, but only abnormal results are displayed) Labs Reviewed  COMPREHENSIVE METABOLIC PANEL - Abnormal; Notable for the following:       Result Value   Sodium 133 (*)    Potassium 3.1 (*)    CO2 20 (*)    Glucose, Bld 123 (*)    Calcium 8.6 (*)    All other components within normal limits  CBC WITH DIFFERENTIAL/PLATELET - Abnormal; Notable for the following:    WBC 21.4 (*)    Neutro Abs 18.9 (*)    Monocytes Absolute 1.7 (*)    All other components within normal limits  URINALYSIS, ROUTINE W REFLEX MICROSCOPIC - Abnormal; Notable for the following:    APPearance HAZY (*)    Glucose, UA 150 (*)    Hgb urine dipstick MODERATE (*)    Ketones, ur 20 (*)    Protein, ur 100 (*)    Leukocytes, UA SMALL (*)    Bacteria, UA RARE (*)    Squamous Epithelial / LPF 6-30 (*)    All other components within normal limits  ROCKY MTN SPOTTED FVR ABS PNL(IGG+IGM)  B. BURGDORFI ANTIBODIES  I-STAT CG4 LACTIC ACID, ED  I-STAT CG4 LACTIC ACID, ED    EKG  EKG Interpretation None        Radiology Dg Chest 2 View  Result Date: 04/23/2017 CLINICAL DATA:  Cough for 2 weeks.  Fever and body aches. EXAM: CHEST  2 VIEW COMPARISON:  None. FINDINGS: Cardiomediastinal silhouette is normal. Mediastinal contours appear intact. There is no evidence of lobar airspace consolidation, pleural effusion or pneumothorax. Subtle patchy airspace consolidation with probable bronchiectasis in the right lower thorax. Osseous structures are without acute abnormality. Soft tissues are grossly normal. IMPRESSION: Subtle patchy airspace consolidation with probable bronchiectasis in the right lower thorax. This may represent chronic bronchitis or developing bronchopneumonia. Please follow-up to resolution. Electronically Signed   By: Ted Mcalpine M.D.   On: 04/23/2017 20:01    Procedures Procedures (including critical care time)  Medications Ordered in ED Medications  acetaminophen (TYLENOL) tablet 650 mg (650 mg Oral Given 04/23/17 1911)  ketorolac (TORADOL) 30 MG/ML injection 30 mg (30 mg Intravenous Given 04/23/17 2217)  ondansetron (ZOFRAN) injection 4 mg (4 mg Intravenous Given 04/23/17 2215)  potassium chloride SA (K-DUR,KLOR-CON) CR tablet 40 mEq (40 mEq Oral Given 04/24/17 0032)  prochlorperazine (COMPAZINE) injection 5 mg (5 mg Intravenous Given 04/24/17 0035)  diphenhydrAMINE (BENADRYL) injection 25 mg (25 mg Intravenous Given 04/24/17 0037)  acetaminophen (TYLENOL) tablet 650 mg (650 mg Oral Given 04/24/17 0031)  doxycycline (VIBRA-TABS) tablet 100 mg (100 mg Oral Given 04/24/17 0031)  sodium chloride 0.9 % bolus 1,000 mL (1,000 mLs Intravenous New Bag/Given 04/24/17 0036)     Initial Impression / Assessment and Plan / ED Course  I have reviewed the triage vital signs and the nursing notes.  Pertinent labs & imaging results that were available during my care of the patient were reviewed by me and considered in my medical decision making (see chart for details).     11:45 PM Patient  reporting her headache is worse after Toradol. She  still continues to have back pain. [name] temperature has increased again to 100.3. This may be cause of headache as well as potential tickborne illness. We'll give her Compazine and Benadryl and reevaluated. We'll also give first dose of doxycycline, replace potassium, and repeat dose of Tylenol. We'll also give 1 L of fluids in addition to 500 bolus she has already received.  Patient with probable pneumonia on chest x-ray and potentially tickborne illness. WBC 21.4. CMP shows sodium 133, potassium 3.1. Lactate 1.24. RMSF and Lyme testing pending. UA shows moderate hematuria, small leukocytes. Patient reports that she may have had hematuria in the past. She denies any history of kidney stones. Her back pain is more likely musculoskeletal versus related to nephrolithiasis. Back pain is lumbar and reproducible. Patient given first dose of doxycycline in the ED and will discharge home with 2 week course. Patient given Toradol, Compazine, Benadryl, Tylenol. At shift change, patient care transferred to Sharilyn Sites, PA-C for continued evaluation, follow up of Headache cocktail fluids and determination of disposition. Anticipate discharge if symptoms improved. Patient to follow up with PCP this week for recheck of symptoms as well as recheck of urine for hematuria.  I discussed patient case with Dr. Verdie Mosher who guided the patient's management and agrees with plan.   Final Clinical Impressions(s) / ED Diagnoses   Final diagnoses:  Community acquired pneumonia of right lower lobe of lung (HCC)  Fever in other diseases  Acute nonintractable headache, unspecified headache type    New Prescriptions New Prescriptions   ACETAMINOPHEN (TYLENOL) 500 MG TABLET    Take 1 tablet (500 mg total) by mouth every 6 (six) hours as needed.   BENZONATATE (TESSALON) 100 MG CAPSULE    Take 1 capsule (100 mg total) by mouth every 8 (eight) hours.   CYCLOBENZAPRINE (FLEXERIL)  10 MG TABLET    Take 1 tablet (10 mg total) by mouth 2 (two) times daily as needed for muscle spasms.   DOXYCYCLINE (VIBRAMYCIN) 100 MG CAPSULE    Take 1 capsule (100 mg total) by mouth 2 (two) times daily.   IBUPROFEN (ADVIL,MOTRIN) 800 MG TABLET    Take 1 tablet (800 mg total) by mouth 3 (three) times daily.     Emi Holes, PA-C 04/24/17 0102    Lavera Guise, MD 04/24/17 [phone]

## 2017-04-23 NOTE — ED Notes (Signed)
Pt inquiring how long wait times were at this time, pt explained protocol and why her care was being started in triage while waiting for a room. Pt overheard telling phlebotomy she may leave and go home and call EMS to bring her back to ED so she can be placed in a room. Explained to patient even some EMS patients come straight to triage.

## 2017-04-24 MED ORDER — BENZONATATE 100 MG PO CAPS: 100.0000 mg | ORAL_CAPSULE | Freq: Three times a day (TID) | ORAL | 0 refills | Status: DC

## 2017-04-24 MED ORDER — CYCLOBENZAPRINE HCL 10 MG PO TABS: 10.0000 mg | ORAL_TABLET | Freq: Two times a day (BID) | ORAL | 0 refills | Status: DC | PRN

## 2017-04-24 MED ORDER — DOXYCYCLINE HYCLATE 100 MG PO CAPS: 100.0000 mg | ORAL_CAPSULE | Freq: Two times a day (BID) | ORAL | 0 refills | Status: DC

## 2017-04-24 MED ORDER — ACETAMINOPHEN 500 MG PO TABS: 500.0000 mg | ORAL_TABLET | Freq: Four times a day (QID) | ORAL | 0 refills | Status: DC | PRN

## 2017-04-24 MED ORDER — IBUPROFEN 800 MG PO TABS: 800.0000 mg | ORAL_TABLET | Freq: Three times a day (TID) | ORAL | 0 refills | Status: DC

## 2017-04-24 NOTE — ED Provider Notes (Signed)
Assumed care from PA Law at shift change.  See her notes for full H&P.    Plan:  Reassess after second fluid bolus and migraine cocktail.  2:37 AM Patient has been resting comfortably, headache improved. Still able to get up and ambulate around. She continues to complain of some generalized body aches. Remains without nuchal rigidity or other signs of toxicity to suggest meningitis. [name] vitals remain stable here. Feel she is appropriate for discharge home with plan and prescriptions as outlined by PA Law and Dr. Verdie Mosher.   Garlon Hatchet, PA-C 04/24/17 7614    Glynn Octave, MD 04/24/17 [phone]

## 2017-04-24 NOTE — Discharge Instructions (Signed)
Medications: Doxycycline, Tessalon, ibuprofen, Tylenol, Flexeril  Treatment: Take doxycycline twice daily for 2 weeks. Make sure to finish all of this medication. Take Tessalon every 8 hours as needed for cough. Take ibuprofen every 8 hours and alternate with Tylenol every 6 hours. Take Flexeril twice daily as needed for muscle pain and spasms. Do not drive or operate machinery when taking this medication. Make sure to stay well-hydrated.  Follow-up: Please follow-up with your primary care provider this week for recheck of symptoms as well as recheck of urine to make sure urine has cleared. Please also follow-up for repeat chest x-ray in 4 weeks to make sure pneumonia has cleared. Please return to emergency department if you develop any new or worsening symptoms.

## 2017-04-25 LAB — B. BURGDORFI ANTIBODIES: B burgdorferi Ab IgG+IgM: <0.91 {ISR} (ref 0.00–0.90)

## 2017-04-26 LAB — ROCKY MTN SPOTTED FVR ABS PNL(IGG+IGM)
RMSF IgG: NEGATIVE
RMSF IgM: 0.36 index (ref 0.00–0.89)

## 2018-02-13 ENCOUNTER — Ambulatory Visit (INDEPENDENT_AMBULATORY_CARE_PROVIDER_SITE_OTHER): Payer: 59 | Admitting: Obstetrics & Gynecology

## 2018-02-13 ENCOUNTER — Other Ambulatory Visit (HOSPITAL_COMMUNITY)
Admission: RE | Admit: 2018-02-13 | Discharge: 2018-02-13 | Disposition: A | Discharge status: Home / Self Care | Payer: PRIVATE HEALTH INSURANCE | Source: Ambulatory Visit | Attending: Obstetrics & Gynecology | Admitting: Obstetrics & Gynecology

## 2018-02-13 ENCOUNTER — Encounter: Payer: Self-pay | Admitting: Obstetrics & Gynecology

## 2018-02-13 VITALS — BP 112/73 | HR 72 | Wt 133.2 lb

## 2018-02-13 DIAGNOSIS — Z01419 Encounter for gynecological examination (general) (routine) without abnormal findings: Secondary | ICD-10-CM

## 2018-02-13 DIAGNOSIS — N926 Irregular menstruation, unspecified: Secondary | ICD-10-CM

## 2018-02-13 DIAGNOSIS — Z1151 Encounter for screening for human papillomavirus (HPV): Secondary | ICD-10-CM | POA: Insufficient documentation

## 2018-02-13 DIAGNOSIS — R399 Unspecified symptoms and signs involving the genitourinary system: Secondary | ICD-10-CM

## 2018-02-13 LAB — POCT URINE QUALITATIVE DIPSTICK BLOOD

## 2018-02-13 MED ORDER — CIPROFLOXACIN HCL 500 MG PO TABS: 500.0000 mg | ORAL_TABLET | Freq: Two times a day (BID) | ORAL | 3 refills | Status: DC

## 2018-02-13 MED ORDER — IBUPROFEN 800 MG PO TABS: 800.0000 mg | ORAL_TABLET | Freq: Three times a day (TID) | ORAL | 4 refills | Status: DC | PRN

## 2018-02-13 MED ORDER — FLUCONAZOLE 150 MG PO TABS: 150.0000 mg | ORAL_TABLET | Freq: Once | ORAL | 3 refills | Status: DC

## 2018-02-13 MED ORDER — FLUCONAZOLE 150 MG PO TABS: 150.0000 mg | ORAL_TABLET | Freq: Once | ORAL | 3 refills | Status: AC

## 2018-02-13 NOTE — Progress Notes (Signed)
LAST PAP 12/2015- NORMAL

## 2018-02-13 NOTE — Patient Instructions (Signed)
Thank you for enrolling in MyChart. Please follow the instructions below to securely access your online medical record. MyChart allows you to send messages to your doctor, view your test results, manage appointments, and more.   How Do I Sign Up? 1. In your Internet browser, go to the Address Bar and enter [email or web address]. 2. Click on the Sign Up Now link in the Sign In box. You will see the New Member Sign Up page. 3. Enter your MyChart Access Code exactly as it appears below. You will not need to use this code after you've completed the sign-up process. If you do not sign up before the expiration date, you must request a new code.  MyChart Access Code: Activation code not generated Current MyChart Status: Active  4. Enter your Social Security Number ([id number]) and Date of Birth (mm/dd/yyyy) as indicated and click Submit. You will be taken to the next sign-up page. 5. Create a MyChart ID. This will be your MyChart login ID and cannot be changed, so think of one that is secure and easy to remember. 6. Create a MyChart password. You can change your password at any time. 7. Enter your Password Reset Question and Answer. This can be used at a later time if you forget your password.  8. Enter your e-mail address. You will receive e-mail notification when new information is available in MyChart. 9. Click Sign Up. You can now view your medical record.   Additional Information Remember, MyChart is NOT to be used for urgent needs. For medical emergencies, dial 911.    

## 2018-02-13 NOTE — Progress Notes (Signed)
GYNECOLOGY ANNUAL PREVENTATIVE CARE ENCOUNTER NOTE  Subjective:   Heather [name] is a 37 y.o. [phone] female here for a routine annual gynecologic exam.  Current complaints: abnormal menstrual period last month, with very light bleeding. Was worried about possible pregnancy, had negative UPT.  Always is regular with her periods, she is very concerned and wants other evaluation.  Also concerned about possible UTI; has dysuria and urgency similar to other UTIs in past. Wants antibiotics, also wants Diflucan for possible rebound yeast infection.  Denies other abnormal vaginal bleeding, discharge, pelvic pain, problems with intercourse or other gynecologic concerns.    Gynecologic History No LMP recorded. Contraception: bilateral salpingectomy Last Pap: 12/25/2015. Results were: normal with negative HPV   Obstetric History OB History  Gravida Para Term Preterm AB Living  4 4 4     4   SAB TAB Ectopic Multiple Live Births        0 4    # Outcome Date GA Lbr Len/2nd Weight Sex Delivery Anes PTL Lv  4 Term 05/05/15 [redacted]w[redacted]d / 00:04 6 lb 5.1 oz (2.865 kg) M Vag-Spont EPI  LIV  3 Term 10/02/13 [redacted]w[redacted]d 137:29 / 00:16 7 lb 8.1 oz (3.405 kg) M Vag-Spont EPI  LIV  2 Term 06/02/05     Vag-Spont   LIV  1 Term 08/07/01 [redacted]w[redacted]d    Vag-Spont   LIV    Past Medical History:  Diagnosis Date  . Anemia    History of  . Anxiety    stopped meds with pregnancy  . Paresthesia of both feet   . Tobacco abuse     Past Surgical History:  Procedure Laterality Date  . ANTERIOR AND POSTERIOR REPAIR N/A 08/04/2015   Procedure: ANTERIOR (CYSTOCELE) AND POSTERIOR REPAIR (RECTOCELE);  Surgeon: Allie Bossier, MD;  Location: [facility];  Service: Gynecology;  Laterality: N/A;  . HERNIA REPAIR     when 8 wks old  . LAPAROSCOPIC BILATERAL SALPINGECTOMY Bilateral 08/04/2015   Procedure: LAPAROSCOPIC BILATERAL SALPINGECTOMY;  Surgeon: Allie Bossier, MD;  Location: [facility];  Service: Gynecology;  Laterality: Bilateral;  . WISDOM  TOOTH EXTRACTION      Current Outpatient Medications on File Prior to Visit  Medication Sig Dispense Refill  . Vitamin D, Ergocalciferol, (DRISDOL) 50000 units CAPS capsule Take 1 capsule (50,000 Units total) by mouth every 7 (seven) days. For eight weeks 8 capsule 0  . acetaminophen (TYLENOL) 500 MG tablet Take 1 tablet (500 mg total) by mouth every 6 (six) hours as needed. 30 tablet 0  . Aspirin-Acetaminophen (GOODYS BODY PAIN PO) Take by mouth.     No current facility-administered medications on file prior to visit.     No Known Allergies  Social History   Socioeconomic History  . Marital status: Single    Spouse name: Not on file  . Number of children: Not on file  . Years of education: Not on file  . Highest education level: Not on file  Occupational History  . Not on file  Social Needs  . Financial resource strain: Not on file  . Food insecurity:    Worry: Not on file    Inability: Not on file  . Transportation needs:    Medical: Not on file    Non-medical: Not on file  Tobacco Use  . Smoking status: Current Every Day Smoker    Packs/day: 1.00    Years: 20.00    Pack years: 20.00    Types: Cigarettes  .  Smokeless tobacco: Never Used  Substance and Sexual Activity  . Alcohol use: Yes    Comment: rare  . Drug use: No  . Sexual activity: Yes    Birth control/protection: Surgical  Lifestyle  . Physical activity:    Days per week: Not on file    Minutes per session: Not on file  . Stress: Not on file  Relationships  . Social connections:    Talks on phone: Not on file    Gets together: Not on file    Attends religious service: Not on file    Active member of club or organization: Not on file    Attends meetings of clubs or organizations: Not on file    Relationship status: Not on file  . Intimate partner violence:    Fear of current or ex partner: Not on file    Emotionally abused: Not on file    Physically abused: Not on file    Forced sexual activity:  Not on file  Other Topics Concern  . Not on file  Social History Narrative  . Not on file    Family History  Problem Relation Age of Onset  . Neuropathy Mother   . Thyroid disease Father   . Alcohol abuse Neg Hx   . Arthritis Neg Hx   . Asthma Neg Hx   . Birth defects Neg Hx   . Cancer Neg Hx   . COPD Neg Hx   . Depression Neg Hx   . Diabetes Neg Hx   . Drug abuse Neg Hx   . Early death Neg Hx   . Hearing loss Neg Hx   . Heart disease Neg Hx   . Hyperlipidemia Neg Hx   . Hypertension Neg Hx   . Kidney disease Neg Hx   . Learning disabilities Neg Hx   . Mental illness Neg Hx   . Mental retardation Neg Hx   . Miscarriages / Stillbirths Neg Hx   . Stroke Neg Hx   . Vision loss Neg Hx   . Varicose Veins Neg Hx     The following portions of the patient's history were reviewed and updated as appropriate: allergies, current medications, past family history, past medical history, past social history, past surgical history and problem list.  Review of Systems Pertinent items noted in HPI and remainder of comprehensive ROS otherwise negative.   Objective:  BP 112/73   Pulse 72   Wt 133 lb 3.2 oz (60.4 kg)   BMI 24.36 kg/m  CONSTITUTIONAL: Well-developed, well-nourished female in no acute distress.  HENT:  Normocephalic, atraumatic, External right and left ear normal. Oropharynx is clear and moist EYES: Conjunctivae and EOM are normal. Pupils are equal, round, and reactive to light. No scleral icterus.  NECK: Normal range of motion, supple, no masses.  Normal thyroid.  SKIN: Skin is warm and dry. No rash noted. Not diaphoretic. No erythema. No pallor. MUSCULOSKELETAL: Normal range of motion. No tenderness.  No cyanosis, clubbing, or edema.  2+ distal pulses. NEUROLOGIC: Alert and oriented to person, place, and time. Normal reflexes, muscle tone coordination. No cranial nerve deficit noted. PSYCHIATRIC: Normal mood and affect. Normal behavior. Normal judgment and thought  content. CARDIOVASCULAR: Normal heart rate noted, regular rhythm RESPIRATORY: Clear to auscultation bilaterally. Effort and breath sounds normal, no problems with respiration noted. BREASTS: Symmetric in size. No masses, skin changes, nipple drainage, or lymphadenopathy. ABDOMEN: Soft, normal bowel sounds, no distention noted.  No tenderness, rebound or guarding.  PELVIC: Normal appearing external genitalia; normal appearing vaginal mucosa and cervix with prominent ectropion.  No abnormal discharge noted.  Pap smear obtained; some bleeding from ectropion noted.  Normal uterine size, no other palpable masses, no uterine or adnexal tenderness.  Results for orders placed or performed in visit on 02/13/18 (from the past 24 hour(s))  POCT urine qual dipstick blood     Status: None   Collection Time: 02/13/18  2:04 PM  Result Value Ref Range   Urinanalysis Positive for nitrites and blood      Assessment and Plan:  1. Well woman exam Healthcare maintenance labs and pap smear done. - CBC - Hemoglobin A1c - Lipid panel - Comprehensive metabolic panel - Hepatitis B surface antigen - Hepatitis C antibody - RPR - HIV antibody - Cervicovaginal ancillary only - Cytology - PAP Will follow up results of pap smear, labs and manage accordingly.  2. UTI symptoms Positive for nitrites and blood.  Urine culture sent, Ciprofloxacin ordered. Ibuprofen prn pain. Diflucan ordered. - POCT urine qual dipstick blood - Urine Culture - ciprofloxacin (CIPRO) 500 MG tablet; Take 1 tablet (500 mg total) by mouth 2 (two) times daily.  Dispense: 6 tablet; Refill: 3 - ibuprofen (ADVIL,MOTRIN) 800 MG tablet; Take 1 tablet (800 mg total) by mouth 3 (three) times daily with meals as needed for headache, moderate pain or cramping.  Dispense: 30 tablet; Refill: 4 - fluconazole (DIFLUCAN) 150 MG tablet; Take 1 tablet (150 mg total) by mouth once for 1 dose. Can take additional dose three days later if symptoms persist   Dispense: 1 tablet; Refill: 3  3. Abnormal menstrual periods Not concerned about one unusual period; tried to reassure patient but patient is very anxious. Informed her that stress or other factors can interfere with her periods.  Will check labs,will follow up results and manage accordingly. - TSH - Prolactin - Beta hCG quant (ref lab) Routine preventative health maintenance measures emphasized. Please refer to After Visit Summary for other counseling recommendations.    Jaynie CollinsUGONNA  ANYANWU, MD, FACOG Obstetrician & Gynecologist, [facility], [facility]

## 2018-02-14 LAB — CBC
HEMATOCRIT: 38.7 % (ref 34.0–46.6)
Hemoglobin: 13.5 g/dL (ref 11.1–15.9)
MCH: 30.9 pg (ref 26.6–33.0)
MCHC: 34.9 g/dL (ref 31.5–35.7)
MCV: 89 fL (ref 79–97)
Platelets: 327 10*3/uL (ref 150–450)
RBC: 4.37 x10E6/uL (ref 3.77–5.28)
RDW: 13.9 % (ref 12.3–15.4)
WBC: 8.9 10*3/uL (ref 3.4–10.8)

## 2018-02-14 LAB — PROLACTIN: PROLACTIN: 15.9 ng/mL (ref 4.8–23.3)

## 2018-02-14 LAB — COMPREHENSIVE METABOLIC PANEL
ALBUMIN: 4.7 g/dL (ref 3.5–5.5)
ALK PHOS: 70 IU/L (ref 39–117)
ALT: 11 IU/L (ref 0–32)
AST: 13 IU/L (ref 0–40)
Albumin/Globulin Ratio: 2.2 (ref 1.2–2.2)
BILIRUBIN TOTAL: 0.4 mg/dL (ref 0.0–1.2)
BUN / CREAT RATIO: 16 (ref 9–23)
BUN: 10 mg/dL (ref 6–20)
CHLORIDE: 101 mmol/L (ref 96–106)
CO2: 22 mmol/L (ref 20–29)
CREATININE: 0.62 mg/dL (ref 0.57–1.00)
Calcium: 9.5 mg/dL (ref 8.7–10.2)
GFR calc Af Amer: 136 mL/min/{1.73_m2} (ref >59)
GFR calc non Af Amer: 118 mL/min/{1.73_m2} (ref >59)
GLOBULIN, TOTAL: 2.1 g/dL (ref 1.5–4.5)
Glucose: 84 mg/dL (ref 65–99)
Potassium: 4.1 mmol/L (ref 3.5–5.2)
SODIUM: 139 mmol/L (ref 134–144)
Total Protein: 6.8 g/dL (ref 6.0–8.5)

## 2018-02-14 LAB — LIPID PANEL
CHOLESTEROL TOTAL: 111 mg/dL (ref 100–199)
Chol/HDL Ratio: 2.4 ratio (ref 0.0–4.4)
HDL: 46 mg/dL (ref >39)
LDL Calculated: 51 mg/dL (ref 0–99)
Triglycerides: 72 mg/dL (ref 0–149)
VLDL CHOLESTEROL CAL: 14 mg/dL (ref 5–40)

## 2018-02-14 LAB — HEPATITIS C ANTIBODY: Hep C Virus Ab: 0.1 s/co ratio (ref 0.0–0.9)

## 2018-02-14 LAB — HEPATITIS B SURFACE ANTIGEN: Hepatitis B Surface Ag: NEGATIVE

## 2018-02-14 LAB — RPR: RPR Ser Ql: NONREACTIVE

## 2018-02-14 LAB — TSH: TSH: 1.56 u[IU]/mL (ref 0.450–4.500)

## 2018-02-14 LAB — HIV ANTIBODY (ROUTINE TESTING W REFLEX): HIV SCREEN 4TH GENERATION: NONREACTIVE

## 2018-02-14 LAB — HEMOGLOBIN A1C
ESTIMATED AVERAGE GLUCOSE: 97 mg/dL
Hgb A1c MFr Bld: 5 % (ref 4.8–5.6)

## 2018-02-14 LAB — BETA HCG QUANT (REF LAB): hCG Quant: <1 m[IU]/mL

## 2018-02-15 LAB — URINE CULTURE

## 2018-02-15 LAB — CERVICOVAGINAL ANCILLARY ONLY
Chlamydia: NEGATIVE
Neisseria Gonorrhea: NEGATIVE
TRICH (WINDOWPATH): NEGATIVE

## 2018-02-16 LAB — CYTOLOGY - PAP
DIAGNOSIS: NEGATIVE
HPV: NOT DETECTED

## 2018-11-07 ENCOUNTER — Emergency Department
Admission: EM | Admit: 2018-11-07 | Discharge: 2018-11-07 | Disposition: A | Discharge status: Home / Self Care | Payer: Medicaid Other | Attending: Student in an Organized Health Care Education/Training Program | Admitting: Student in an Organized Health Care Education/Training Program

## 2018-11-07 ENCOUNTER — Other Ambulatory Visit: Payer: Self-pay

## 2018-11-07 DIAGNOSIS — J111 Influenza due to unidentified influenza virus with other respiratory manifestations: Secondary | ICD-10-CM | POA: Diagnosis not present

## 2018-11-07 DIAGNOSIS — Z209 Contact with and (suspected) exposure to unspecified communicable disease: Secondary | ICD-10-CM | POA: Insufficient documentation

## 2018-11-07 DIAGNOSIS — F17210 Nicotine dependence, cigarettes, uncomplicated: Secondary | ICD-10-CM | POA: Insufficient documentation

## 2018-11-07 DIAGNOSIS — R509 Fever, unspecified: Secondary | ICD-10-CM | POA: Diagnosis present

## 2018-11-07 MED ORDER — OSELTAMIVIR PHOSPHATE 75 MG PO CAPS: 75.0000 mg | ORAL_CAPSULE | Freq: Two times a day (BID) | ORAL | 0 refills | Status: AC

## 2018-11-07 NOTE — Discharge Instructions (Signed)
You are being treated for influenza. Your symptoms are consistent with flu. Take the Tamiflu as directed.

## 2018-11-07 NOTE — ED Triage Notes (Signed)
Here for flu like symptoms. Symptoms x 2 days. A&O, ambulatory. No distress noted.

## 2018-11-07 NOTE — ED Provider Notes (Signed)
[facility] Emergency Department Provider Note ____________________________________________  Time seen: 1831  I have reviewed the triage vital signs and the nursing notes.  HISTORY  Chief Complaint  Influenza  HPI Heather [name] is a 38 y.o. female presents herself to the ED, accompanied by her 67-year-old son, for evaluation of flulike symptoms.  Patient describes fevers, cough, and body aches 2 days prior.  She did not receive the seasonal flu vaccine.  She reports similar symptoms in her household contacts.  She denies any recent travel or other exposures at this time.  Past Medical History:  Diagnosis Date  . Anemia    History of  . Anxiety    stopped meds with pregnancy  . Paresthesia of both feet   . Tobacco abuse     Patient Active Problem List   Diagnosis Date Noted  . Current every day smoker 08/22/2013  . Depression with anxiety 01/06/2012  . Migraines 01/06/2012    Past Surgical History:  Procedure Laterality Date  . ANTERIOR AND POSTERIOR REPAIR N/A 08/04/2015   Procedure: ANTERIOR (CYSTOCELE) AND POSTERIOR REPAIR (RECTOCELE);  Surgeon: Allie Bossier, MD;  Location: [facility];  Service: Gynecology;  Laterality: N/A;  . HERNIA REPAIR     when 8 wks old  . LAPAROSCOPIC BILATERAL SALPINGECTOMY Bilateral 08/04/2015   Procedure: LAPAROSCOPIC BILATERAL SALPINGECTOMY;  Surgeon: Allie Bossier, MD;  Location: [facility];  Service: Gynecology;  Laterality: Bilateral;  . WISDOM TOOTH EXTRACTION      Prior to Admission medications   Medication Sig Start Date End Date Taking? Authorizing Provider  acetaminophen (TYLENOL) 500 MG tablet Take 1 tablet (500 mg total) by mouth every 6 (six) hours as needed. 04/24/17   Emi Holes, PA-C  Aspirin-Acetaminophen (GOODYS BODY PAIN PO) Take by mouth.    [provider]  ciprofloxacin (CIPRO) 500 MG tablet Take 1 tablet (500 mg total) by mouth 2 (two) times daily. 02/13/18   Anyanwu, Jethro Bastos, MD  ibuprofen  (ADVIL,MOTRIN) 800 MG tablet Take 1 tablet (800 mg total) by mouth 3 (three) times daily with meals as needed for headache, moderate pain or cramping. 02/13/18   Anyanwu, Jethro Bastos, MD  oseltamivir (TAMIFLU) 75 MG capsule Take 1 capsule (75 mg total) by mouth 2 (two) times daily for 5 days. 11/07/18 11/12/18  Ellarie Picking, Charlesetta Ivory, PA-C  Vitamin D, Ergocalciferol, (DRISDOL) 50000 units CAPS capsule Take 1 capsule (50,000 Units total) by mouth every 7 (seven) days. For eight weeks 12/29/15   Allie Bossier, MD    Allergies Patient has no known allergies.  Family History  Problem Relation Age of Onset  . Neuropathy Mother   . Thyroid disease Father   . Alcohol abuse Neg Hx   . Arthritis Neg Hx   . Asthma Neg Hx   . Birth defects Neg Hx   . Cancer Neg Hx   . COPD Neg Hx   . Depression Neg Hx   . Diabetes Neg Hx   . Drug abuse Neg Hx   . Early death Neg Hx   . Hearing loss Neg Hx   . Heart disease Neg Hx   . Hyperlipidemia Neg Hx   . Hypertension Neg Hx   . Kidney disease Neg Hx   . Learning disabilities Neg Hx   . Mental illness Neg Hx   . Mental retardation Neg Hx   . Miscarriages / Stillbirths Neg Hx   . Stroke Neg Hx   . Vision loss  Neg Hx   . Varicose Veins Neg Hx     Social History Social History   Tobacco Use  . Smoking status: Current Every Day Smoker    Packs/day: 1.00    Years: 20.00    Pack years: 20.00    Types: Cigarettes  . Smokeless tobacco: Never Used  Substance Use Topics  . Alcohol use: Yes    Comment: rare  . Drug use: No    Review of Systems  Constitutional: Positive for fever. Eyes: Negative for visual changes. ENT: Negative for sore throat. Cardiovascular: Negative for chest pain. Respiratory: Negative for shortness of breath.  Reports mild cough. Gastrointestinal: Negative for abdominal pain, vomiting and diarrhea. Genitourinary: Negative for dysuria. Musculoskeletal: Negative for back pain. Skin: Negative for rash. Neurological: Negative for  headaches, focal weakness or numbness. ____________________________________________  PHYSICAL EXAM:  VITAL SIGNS: ED Triage Vitals  Enc Vitals Group     BP 11/07/18 1800 122/77     Pulse Rate 11/07/18 1800 (!) 116     Resp 11/07/18 1800 18     Temp 11/07/18 1800 99.1 F (37.3 C)     Temp Source 11/07/18 1800 Oral     SpO2 11/07/18 1800 98 %     Weight 11/07/18 1757 150 lb (68 kg)     Height 11/07/18 1757 5\' 1"  (1.549 m)     Head Circumference --      Peak Flow --      Pain Score 11/07/18 1756 6     Pain Loc --      Pain Edu? --      Excl. in GC? --     Constitutional: Alert and oriented. Well appearing and in no distress. Head: Normocephalic and atraumatic. Eyes: Conjunctivae are normal. Normal extraocular movements Ears: Canals clear. TMs intact bilaterally. Nose: No congestion/rhinorrhea/epistaxis. Mouth/Throat: Mucous membranes are moist. Cardiovascular: Normal rate, regular rhythm. Normal distal pulses. Respiratory: Normal respiratory effort. No wheezes/rales/rhonchi. Gastrointestinal: Soft and nontender. No distention. ____________________________________________  PROCEDURES  Procedures ____________________________________________  INITIAL IMPRESSION / ASSESSMENT AND PLAN / ED COURSE  Patient with ED evaluation of flulike symptoms over the last 2 days.  She is reporting similar symptoms in the family contacts, however, no one is been tested for influenza.  She is present with her youngest child at 36 years old, with similar symptoms.  Patient will be treated empirically for influenza.  She is inclined to take Tamiflu and a prescription for the same is provided to her pharmacy.  She should return to the ED as needed for any worsening symptoms. ____________________________________________  FINAL CLINICAL IMPRESSION(S) / ED DIAGNOSES  Final diagnoses:  Influenza      Karmen Stabs, Charlesetta Ivory, PA-C 11/07/18 2320    Willy Eddy, MD 11/07/18 2350

## 2019-02-28 ENCOUNTER — Telehealth: Payer: Self-pay | Admitting: Radiology

## 2019-02-28 NOTE — Telephone Encounter (Signed)
Left message for patient to call [facility] to schedule appointment with Dr Hulan Fray, concerning weight loss

## 2019-04-11 ENCOUNTER — Ambulatory Visit (INDEPENDENT_AMBULATORY_CARE_PROVIDER_SITE_OTHER): Payer: Medicaid Other | Admitting: Obstetrics & Gynecology

## 2019-04-11 ENCOUNTER — Encounter: Payer: Self-pay | Admitting: *Deleted

## 2019-04-11 ENCOUNTER — Encounter: Payer: Self-pay | Admitting: Obstetrics & Gynecology

## 2019-04-11 ENCOUNTER — Other Ambulatory Visit: Payer: Self-pay

## 2019-04-11 ENCOUNTER — Other Ambulatory Visit (HOSPITAL_COMMUNITY)
Admission: RE | Admit: 2019-04-11 | Discharge: 2019-04-11 | Disposition: A | Discharge status: Home / Self Care | Payer: Medicaid Other | Source: Ambulatory Visit | Attending: Obstetrics & Gynecology | Admitting: Obstetrics & Gynecology

## 2019-04-11 VITALS — BP 114/74 | HR 105 | Ht 62.0 in | Wt 146.0 lb

## 2019-04-11 DIAGNOSIS — R5383 Other fatigue: Secondary | ICD-10-CM

## 2019-04-11 DIAGNOSIS — Z01419 Encounter for gynecological examination (general) (routine) without abnormal findings: Secondary | ICD-10-CM | POA: Diagnosis not present

## 2019-04-11 DIAGNOSIS — Z0000 Encounter for general adult medical examination without abnormal findings: Secondary | ICD-10-CM | POA: Diagnosis not present

## 2019-04-11 NOTE — Progress Notes (Signed)
Subjective:    Heather [name] is a 38 y.o.married P4 (17, 13, 5, and 813 yo kids)  female who presents for an annual exam. She wants a referral to a dermatologist and a fam med doc. She has gained 20 pounds in the last year.  The patient is sexually active. GYN screening history: last pap: was normal. The patient wears seatbelts: yes. The patient participates in regular exercise: yes. Has the patient ever been transfused or tattooed?: no. The patient reports that there is not domestic violence in her life.   Menstrual History: OB History    Gravida  4   Para  4   Term  4   Preterm      AB      Living  4     SAB      TAB      Ectopic      Multiple  0   Live Births  124           Menarche age: [age] [name] last menstrual period was 03/25/2019 (approximate).    The following portions of the patient's history were reviewed and updated as appropriate: allergies, current medications, past family history, past medical history, past social history, past surgical history and problem list.  Review of Systems Pertinent items are noted in HPI.   Married for 2 1/2 years years, occasional dysparenia Periods are heavy, last 3-4 days Homemaker FH- + breast cancer in paternal GM and 2 paternal aunts, + colon cancer in paternal cousin, no gyn cancer   Objective:    BP 114/74   Pulse (!) 105   Ht 5\' 2"  (1.575 m)   Wt 146 lb (66.2 kg)   LMP 03/25/2019 (Approximate)   BMI 26.70 kg/m   General Appearance:    Alert, cooperative, no distress, appears stated age  Head:    Normocephalic, without obvious abnormality, atraumatic  Eyes:    PERRL, conjunctiva/corneas clear, EOM's intact, fundi    benign, both eyes  Ears:    Normal TM's and external ear canals, both ears  Nose:   Nares normal, septum midline, mucosa normal, no drainage    or sinus tenderness  Throat:   Lips, mucosa, and tongue normal; teeth and gums normal  Neck:   Supple, symmetrical, trachea midline, no adenopathy;   thyroid:  no enlargement/tenderness/nodules; no carotid   bruit or JVD  Back:     Symmetric, no curvature, ROM normal, no CVA tenderness  Lungs:     Clear to auscultation bilaterally, respirations unlabored  Chest Wall:    No tenderness or deformity   Heart:    Regular rate and rhythm, S1 and S2 normal, no murmur, rub   or gallop  Breast Exam:    No tenderness, masses, or nipple abnormality  Abdomen:     Soft, non-tender, bowel sounds active all four quadrants,    no masses, no organomegaly  Genitalia:    Normal female without lesion, discharge or tenderness     Extremities:   Extremities normal, atraumatic, no cyanosis or edema  Pulses:   2+ and symmetric all extremities  Skin:   Skin color, texture, turgor normal, no rashes or lesions  Lymph nodes:   Cervical, supraclavicular, and axillary nodes normal  Neurologic:   CNII-XII intact, normal strength, sensation and reflexes    throughout  .    Assessment:    Healthy female exam.   Strong FH of breast and colon cancer Anxiety, weight gain   Plan:  Thin prep Pap smear. with cotesting Baseline mammogram [facility] testing Fasting labs today Refer to fam med

## 2019-04-11 NOTE — Progress Notes (Signed)
Having issues with hands and legs going to "sleep"  Wants referral to dermatologist Pt does not have a PCP Anxiety has increased

## 2019-04-12 LAB — COMPREHENSIVE METABOLIC PANEL
ALT: 11 IU/L (ref 0–32)
AST: 14 IU/L (ref 0–40)
Albumin/Globulin Ratio: 2 (ref 1.2–2.2)
Albumin: 4.5 g/dL (ref 3.8–4.8)
Alkaline Phosphatase: 68 IU/L (ref 39–117)
BUN/Creatinine Ratio: 19 (ref 9–23)
BUN: 13 mg/dL (ref 6–20)
Bilirubin Total: 0.3 mg/dL (ref 0.0–1.2)
CO2: 20 mmol/L (ref 20–29)
Calcium: 9.3 mg/dL (ref 8.7–10.2)
Chloride: 102 mmol/L (ref 96–106)
Creatinine, Ser: 0.68 mg/dL (ref 0.57–1.00)
GFR calc Af Amer: 130 mL/min/{1.73_m2} (ref >59)
GFR calc non Af Amer: 113 mL/min/{1.73_m2} (ref >59)
Globulin, Total: 2.3 g/dL (ref 1.5–4.5)
Glucose: 87 mg/dL (ref 65–99)
Potassium: 4 mmol/L (ref 3.5–5.2)
Sodium: 135 mmol/L (ref 134–144)
Total Protein: 6.8 g/dL (ref 6.0–8.5)

## 2019-04-12 LAB — VITAMIN D 25 HYDROXY (VIT D DEFICIENCY, FRACTURES): Vit D, 25-Hydroxy: 33 ng/mL (ref 30.0–100.0)

## 2019-04-12 LAB — LIPID PANEL
Chol/HDL Ratio: 2.8 ratio (ref 0.0–4.4)
Cholesterol, Total: 131 mg/dL (ref 100–199)
HDL: 47 mg/dL (ref >39)
LDL Calculated: 71 mg/dL (ref 0–99)
Triglycerides: 65 mg/dL (ref 0–149)
VLDL Cholesterol Cal: 13 mg/dL (ref 5–40)

## 2019-04-12 LAB — VITAMIN B12: Vitamin B-12: 232 pg/mL (ref 232–1245)

## 2019-04-12 LAB — CBC
Hematocrit: 42.2 % (ref 34.0–46.6)
Hemoglobin: 14.3 g/dL (ref 11.1–15.9)
MCH: 30.9 pg (ref 26.6–33.0)
MCHC: 33.9 g/dL (ref 31.5–35.7)
MCV: 91 fL (ref 79–97)
Platelets: 354 10*3/uL (ref 150–450)
RBC: 4.63 x10E6/uL (ref 3.77–5.28)
RDW: 12.6 % (ref 11.7–15.4)
WBC: 7.6 10*3/uL (ref 3.4–10.8)

## 2019-04-12 LAB — T3, FREE: T3, Free: 4.4 pg/mL (ref 2.0–4.4)

## 2019-04-12 LAB — TSH: TSH: 3.28 u[IU]/mL (ref 0.450–4.500)

## 2019-04-12 LAB — HEMOGLOBIN A1C
Est. average glucose Bld gHb Est-mCnc: 97 mg/dL
Hgb A1c MFr Bld: 5 % (ref 4.8–5.6)

## 2019-04-12 LAB — T4, FREE: Free T4: 1.34 ng/dL (ref 0.82–1.77)

## 2019-04-16 LAB — CYTOLOGY - PAP
Diagnosis: NEGATIVE
HPV: NOT DETECTED

## 2019-04-17 ENCOUNTER — Telehealth: Payer: Self-pay | Admitting: Radiology

## 2019-04-17 NOTE — Telephone Encounter (Signed)
Left message to call [facility] to schedule lab draw for [facility] per dr Hulan Fray

## 2019-05-28 ENCOUNTER — Ambulatory Visit
Admission: RE | Admit: 2019-05-28 | Discharge: 2019-05-28 | Disposition: A | Discharge status: Home / Self Care | Payer: Medicaid Other | Source: Ambulatory Visit | Attending: Obstetrics & Gynecology | Admitting: Obstetrics & Gynecology

## 2019-05-28 ENCOUNTER — Other Ambulatory Visit: Payer: Self-pay

## 2019-05-28 DIAGNOSIS — Z1231 Encounter for screening mammogram for malignant neoplasm of breast: Secondary | ICD-10-CM | POA: Diagnosis not present

## 2019-05-28 DIAGNOSIS — Z01419 Encounter for gynecological examination (general) (routine) without abnormal findings: Secondary | ICD-10-CM

## 2019-05-29 ENCOUNTER — Ambulatory Visit
Admission: RE | Admit: 2019-05-29 | Discharge: 2019-05-29 | Disposition: A | Discharge status: Home / Self Care | Payer: Medicaid Other | Source: Ambulatory Visit | Attending: Obstetrics & Gynecology | Admitting: Obstetrics & Gynecology

## 2019-05-29 ENCOUNTER — Other Ambulatory Visit: Payer: Self-pay

## 2019-05-29 ENCOUNTER — Ambulatory Visit: Payer: Medicaid Other

## 2019-05-29 ENCOUNTER — Other Ambulatory Visit: Payer: Self-pay | Admitting: Obstetrics & Gynecology

## 2019-05-29 DIAGNOSIS — R922 Inconclusive mammogram: Secondary | ICD-10-CM | POA: Diagnosis not present

## 2019-05-29 DIAGNOSIS — R928 Other abnormal and inconclusive findings on diagnostic imaging of breast: Secondary | ICD-10-CM

## 2019-06-26 ENCOUNTER — Other Ambulatory Visit: Payer: Medicaid Other

## 2019-06-26 ENCOUNTER — Other Ambulatory Visit: Payer: Self-pay

## 2019-06-26 NOTE — Progress Notes (Signed)
Patient presented to the office today for [facility] testing once results come back we will call her back. Patient voice understanding.

## 2019-07-05 ENCOUNTER — Encounter: Payer: Self-pay | Admitting: *Deleted

## 2019-07-05 ENCOUNTER — Telehealth: Payer: Self-pay | Admitting: *Deleted

## 2019-07-05 NOTE — Telephone Encounter (Signed)
Pt informed of [facility] results 

## 2019-07-05 NOTE — Telephone Encounter (Signed)
Left message to call back in regards to her results

## 2019-07-26 DIAGNOSIS — D225 Melanocytic nevi of trunk: Secondary | ICD-10-CM | POA: Diagnosis not present

## 2019-07-26 DIAGNOSIS — R208 Other disturbances of skin sensation: Secondary | ICD-10-CM | POA: Diagnosis not present

## 2019-10-07 DIAGNOSIS — Z20822 Contact with and (suspected) exposure to covid-19: Secondary | ICD-10-CM | POA: Diagnosis not present

## 2019-10-18 IMAGING — MG MM DIGITAL SCREENING BILAT W/ CAD
6 series · 6 of 6 positions shown · non-contrast
Comparison: Previous exam(s).

CLINICAL DATA: Screening.

EXAM:
DIGITAL SCREENING BILATERAL MAMMOGRAM WITH CAD

[R CC (1 of 2)]
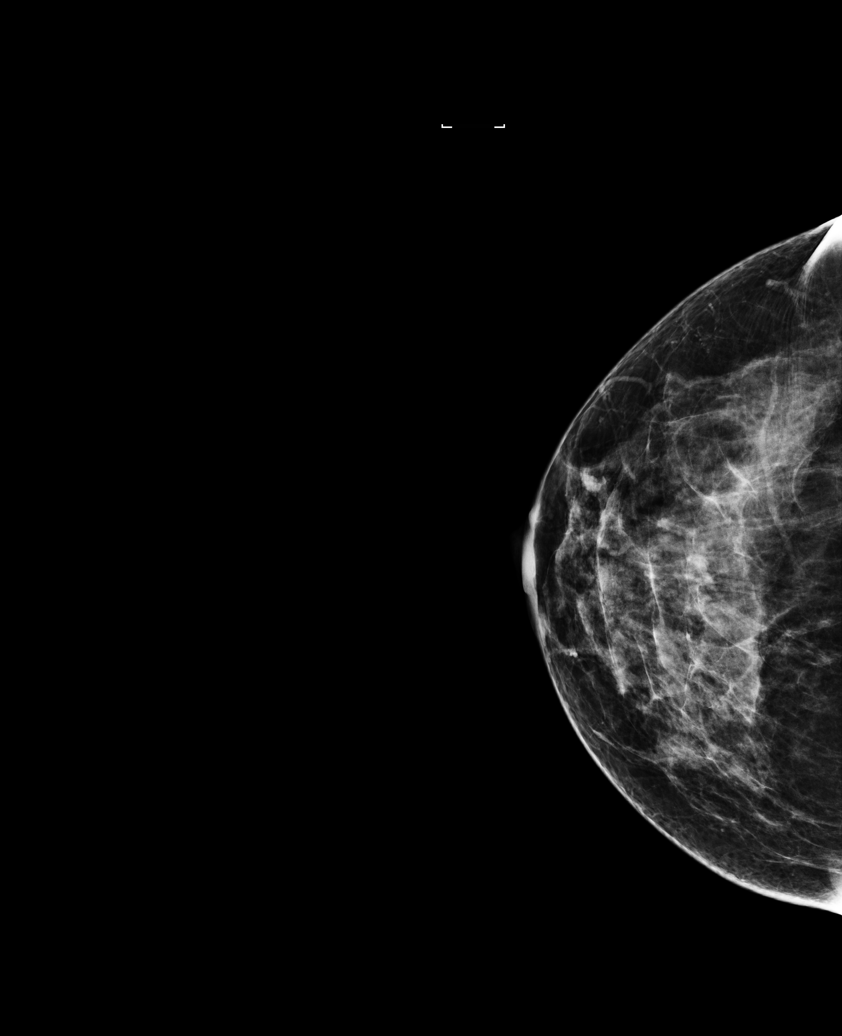

[R CC (2 of 2)]
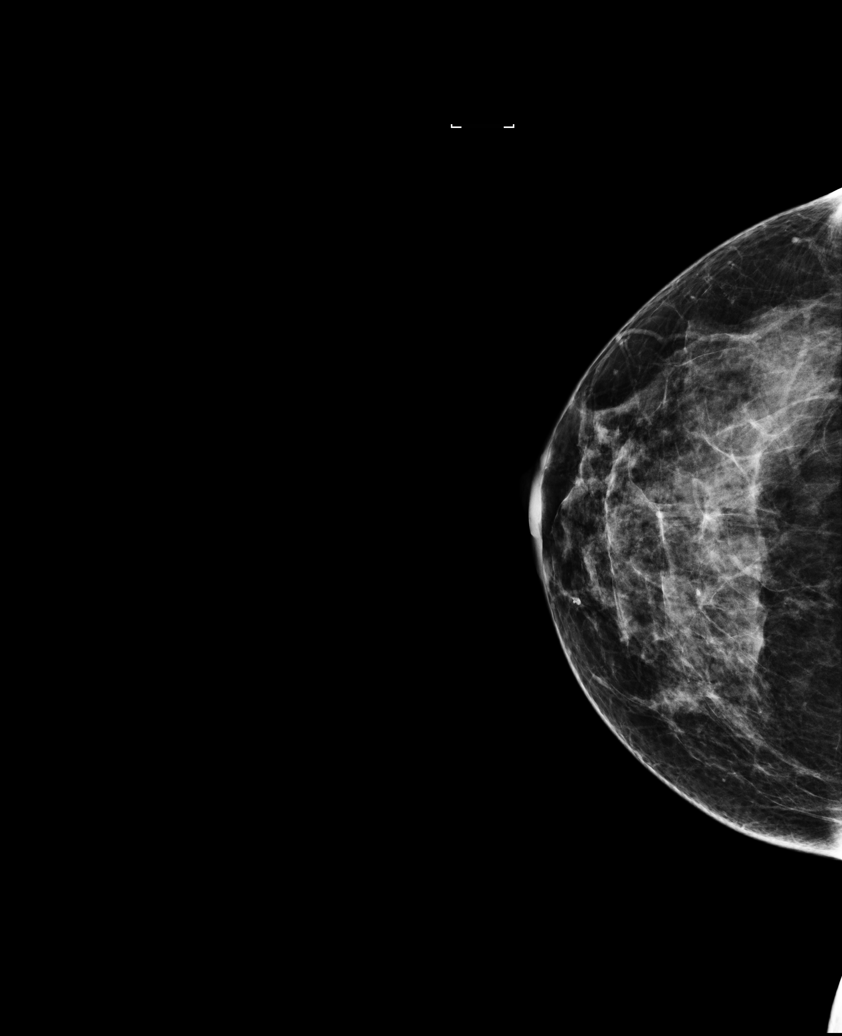

[L MLO]
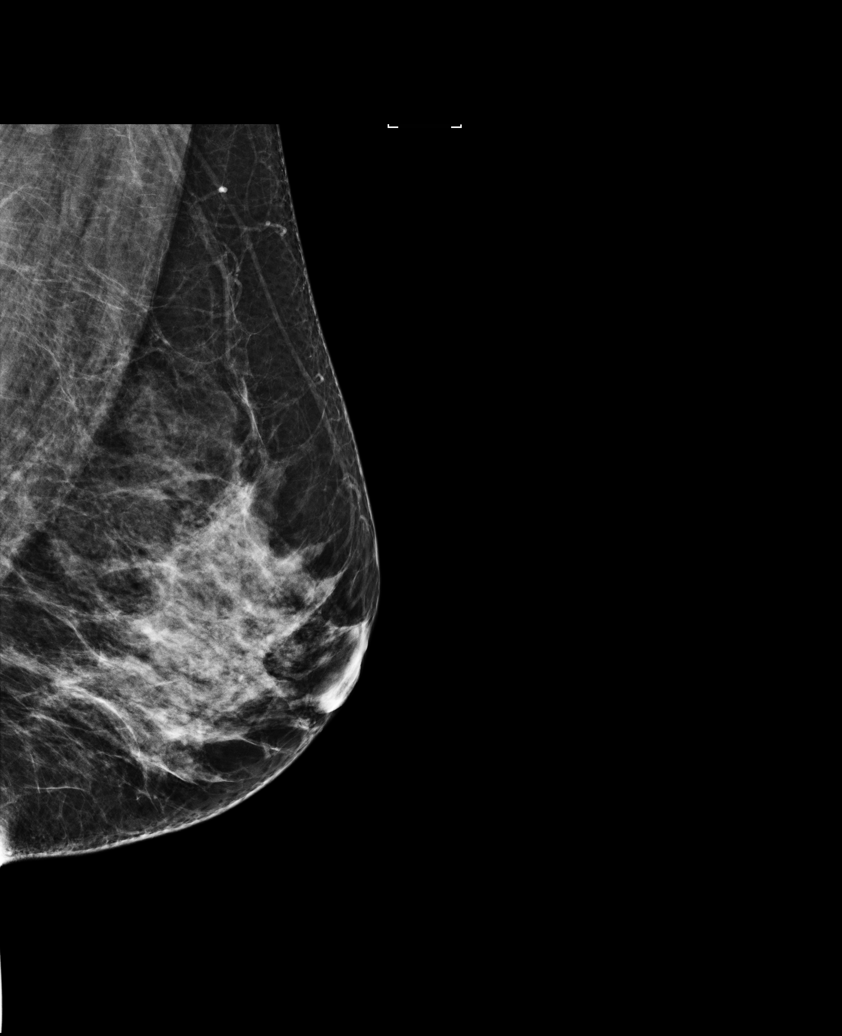

[R MLO]
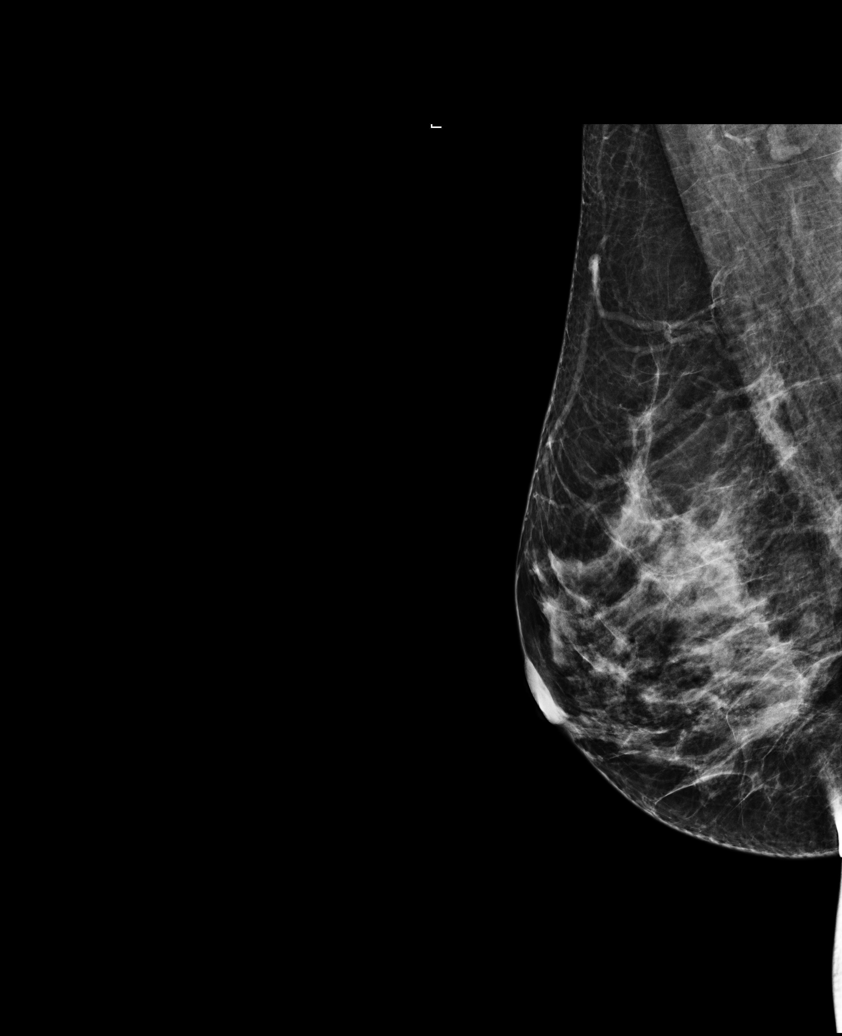

[L XCCL]
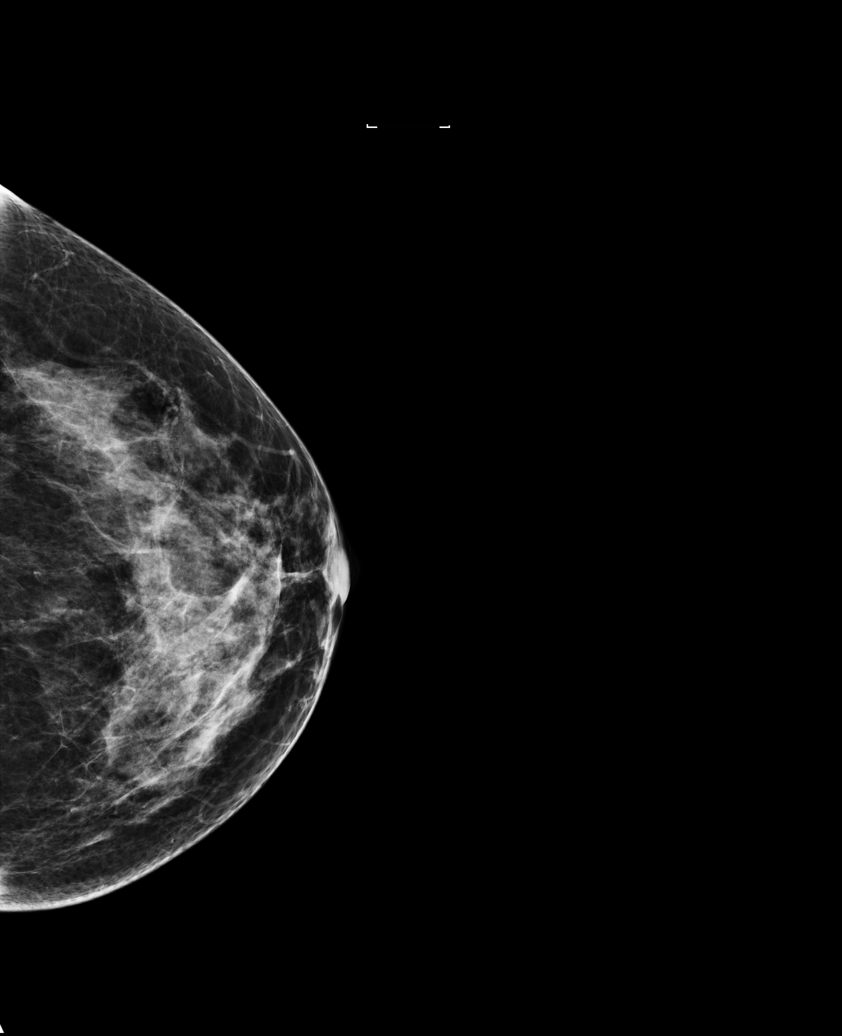

[L CC]
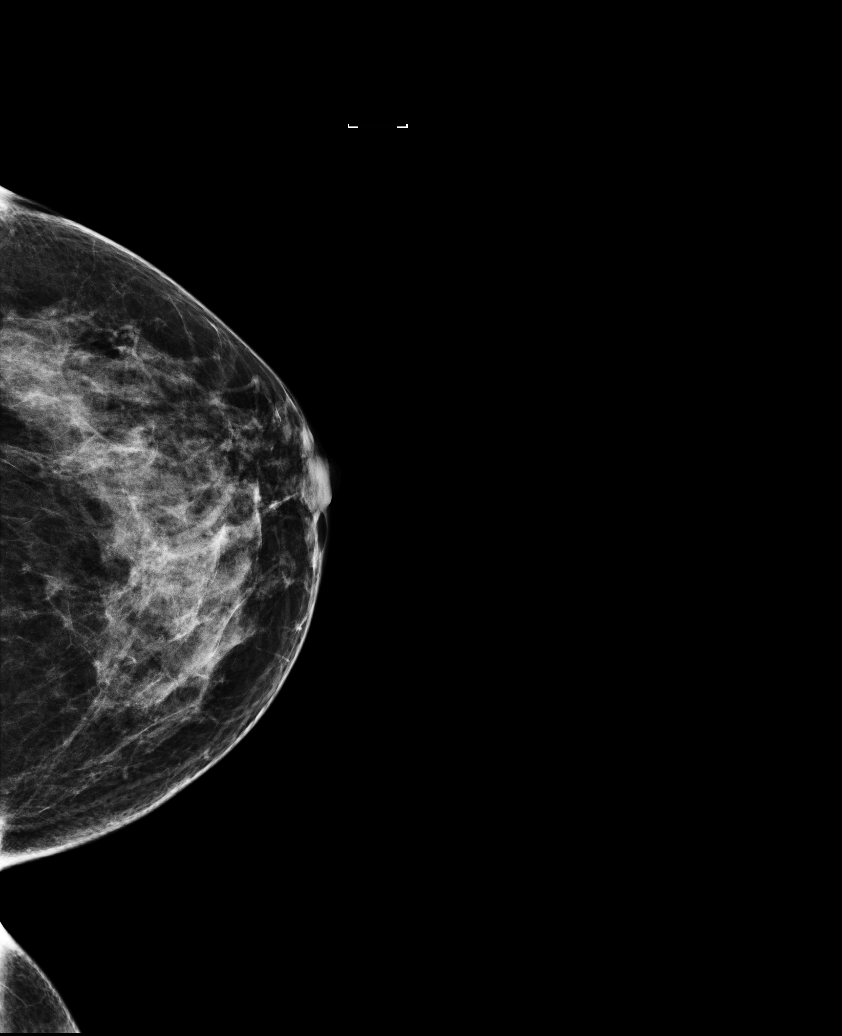

[6 of 6 positions shown; findings below may reference images not displayed]

ACR Breast Density Category c: The breast tissue is heterogeneously
dense, which may obscure small masses.
FINDINGS: In the right breast, a possible asymmetry warrants further
evaluation. In the left breast, no findings suspicious for
malignancy. Images were processed with CAD.
IMPRESSION: Further evaluation is suggested for possible asymmetry in the right
breast.

RECOMMENDATION:
Diagnostic mammogram and possibly ultrasound of the right breast.
(Code:S9-I-77H)

The patient will be contacted regarding the findings, and additional
imaging will be scheduled.

BI-RADS CATEGORY  0: Incomplete. Need additional imaging evaluation
and/or prior mammograms for comparison.

## 2019-12-01 DIAGNOSIS — Z20822 Contact with and (suspected) exposure to covid-19: Secondary | ICD-10-CM | POA: Diagnosis not present

## 2020-01-23 ENCOUNTER — Other Ambulatory Visit: Payer: Self-pay

## 2020-01-23 ENCOUNTER — Emergency Department: Payer: Medicaid Other

## 2020-01-23 ENCOUNTER — Telehealth: Payer: Self-pay

## 2020-01-23 ENCOUNTER — Emergency Department
Admission: EM | Admit: 2020-01-23 | Discharge: 2020-01-23 | Disposition: A | Discharge status: Home / Self Care | Payer: Medicaid Other | Attending: Emergency Medicine | Admitting: Emergency Medicine

## 2020-01-23 DIAGNOSIS — R079 Chest pain, unspecified: Secondary | ICD-10-CM | POA: Diagnosis not present

## 2020-01-23 DIAGNOSIS — R0602 Shortness of breath: Secondary | ICD-10-CM | POA: Diagnosis not present

## 2020-01-23 DIAGNOSIS — R0789 Other chest pain: Secondary | ICD-10-CM | POA: Insufficient documentation

## 2020-01-23 DIAGNOSIS — Z5321 Procedure and treatment not carried out due to patient leaving prior to being seen by health care provider: Secondary | ICD-10-CM | POA: Insufficient documentation

## 2020-01-23 DIAGNOSIS — F419 Anxiety disorder, unspecified: Secondary | ICD-10-CM | POA: Diagnosis not present

## 2020-01-23 LAB — BASIC METABOLIC PANEL
Anion gap: 8 (ref 5–15)
BUN: 9 mg/dL (ref 6–20)
CO2: 25 mmol/L (ref 22–32)
Calcium: 9.1 mg/dL (ref 8.9–10.3)
Chloride: 107 mmol/L (ref 98–111)
Creatinine, Ser: 0.62 mg/dL (ref 0.44–1.00)
GFR calc Af Amer: >60 mL/min (ref >60)
GFR calc non Af Amer: >60 mL/min (ref >60)
Glucose, Bld: 133 mg/dL — ABNORMAL HIGH (ref 70–99)
Potassium: 3.4 mmol/L — ABNORMAL LOW (ref 3.5–5.1)
Sodium: 140 mmol/L (ref 135–145)

## 2020-01-23 LAB — CBC
HCT: 39 % (ref 36.0–46.0)
Hemoglobin: 13.7 g/dL (ref 12.0–15.0)
MCH: 30.7 pg (ref 26.0–34.0)
MCHC: 35.1 g/dL (ref 30.0–36.0)
MCV: 87.4 fL (ref 80.0–100.0)
Platelets: 338 10*3/uL (ref 150–400)
RBC: 4.46 MIL/uL (ref 3.87–5.11)
RDW: 13.1 % (ref 11.5–15.5)
WBC: 8.9 10*3/uL (ref 4.0–10.5)
nRBC: 0 % (ref 0.0–0.2)

## 2020-01-23 LAB — TROPONIN I (HIGH SENSITIVITY): Troponin I (High Sensitivity): <2 ng/L (ref <18)

## 2020-01-23 MED ORDER — SODIUM CHLORIDE 0.9% FLUSH: 3.0000 mL | Freq: Once | INTRAVENOUS | Status: DC

## 2020-01-23 NOTE — ED Triage Notes (Signed)
Pt comes via POV from home with c/o substernal chest pain. Pt states it radiates to left arm. Pt states numbness in legs. Pt states it is hard to breath when laying down.  Pt states it could be anxiety because she has a lot going on and under a lot of stress.  Pt also states possible gallbladder issue

## 2020-01-23 NOTE — Telephone Encounter (Addendum)
Access nurse faxed access nurse note that pt does not have PCP and experiencing anxiety. Office hrs were provided. Copy of access nurse note sent for scanning.access note was not found in portal. I tried calling pt but no answer x 2  and no DPR signed. I was able to speak with pt and pt said no SI or HI. Pt said been about 6 yrs since on med for anxiety. Pt said on 01/20/20 had mid chest pain that woke pt up. Pt did not go to ED. On 01/22/20 pt had H/A., pain in lt shoulder and SOB. Today pt still has lt shoulder pain and H/A and SOB. Pt advised to go to ED now. Pt said she would go to [facility] ED but wanted to schedule NP appt now. Pt said she has been very stressed and anxious. Carrie at front desk was going to make NP appt but pt only has medicaid; I  Lynnell Grain to have pt to go to [facility] ED now and then see if they can advise who pt should FU with. FYI to [facility] office administrator and Carollee Herter RN lead.

## 2020-01-23 NOTE — ED Notes (Signed)
Pt sitting in triage waiting area with no distress noted; pt updated on wait time and instructed where to sit while waiting for an exam room to open; pt becomes very upset, walks to screener and points to nurse and says "fuck her" and informs screener that she is leaving now

## 2020-02-24 DIAGNOSIS — Z20822 Contact with and (suspected) exposure to covid-19: Secondary | ICD-10-CM | POA: Diagnosis not present

## 2020-02-24 DIAGNOSIS — J069 Acute upper respiratory infection, unspecified: Secondary | ICD-10-CM | POA: Diagnosis not present

## 2020-04-27 ENCOUNTER — Other Ambulatory Visit: Payer: Self-pay | Admitting: Obstetrics & Gynecology

## 2020-04-27 DIAGNOSIS — Z1231 Encounter for screening mammogram for malignant neoplasm of breast: Secondary | ICD-10-CM

## 2020-05-19 ENCOUNTER — Ambulatory Visit: Payer: Medicaid Other | Admitting: Obstetrics & Gynecology

## 2020-05-28 ENCOUNTER — Ambulatory Visit: Payer: Medicaid Other

## 2020-06-01 ENCOUNTER — Emergency Department (HOSPITAL_COMMUNITY)
Admission: EM | Admit: 2020-06-01 | Discharge: 2020-06-01 | Disposition: A | Discharge status: Home / Self Care | Payer: Medicaid Other | Attending: Emergency Medicine | Admitting: Emergency Medicine

## 2020-06-01 ENCOUNTER — Emergency Department (HOSPITAL_COMMUNITY): Payer: Medicaid Other

## 2020-06-01 ENCOUNTER — Ambulatory Visit
Admission: RE | Admit: 2020-06-01 | Discharge: 2020-06-01 | Disposition: A | Discharge status: Home / Self Care | Payer: Medicaid Other | Source: Ambulatory Visit | Attending: Obstetrics & Gynecology | Admitting: Obstetrics & Gynecology

## 2020-06-01 ENCOUNTER — Emergency Department (HOSPITAL_BASED_OUTPATIENT_CLINIC_OR_DEPARTMENT_OTHER): Payer: Medicaid Other

## 2020-06-01 ENCOUNTER — Other Ambulatory Visit: Payer: Self-pay

## 2020-06-01 DIAGNOSIS — R079 Chest pain, unspecified: Secondary | ICD-10-CM | POA: Insufficient documentation

## 2020-06-01 DIAGNOSIS — Z1231 Encounter for screening mammogram for malignant neoplasm of breast: Secondary | ICD-10-CM | POA: Diagnosis not present

## 2020-06-01 DIAGNOSIS — M79604 Pain in right leg: Secondary | ICD-10-CM | POA: Diagnosis not present

## 2020-06-01 DIAGNOSIS — Z5321 Procedure and treatment not carried out due to patient leaving prior to being seen by health care provider: Secondary | ICD-10-CM | POA: Insufficient documentation

## 2020-06-01 DIAGNOSIS — R0602 Shortness of breath: Secondary | ICD-10-CM | POA: Diagnosis not present

## 2020-06-01 DIAGNOSIS — F17200 Nicotine dependence, unspecified, uncomplicated: Secondary | ICD-10-CM | POA: Diagnosis not present

## 2020-06-01 DIAGNOSIS — M549 Dorsalgia, unspecified: Secondary | ICD-10-CM | POA: Insufficient documentation

## 2020-06-01 DIAGNOSIS — U071 COVID-19: Secondary | ICD-10-CM

## 2020-06-01 DIAGNOSIS — R0789 Other chest pain: Secondary | ICD-10-CM | POA: Diagnosis not present

## 2020-06-01 LAB — BASIC METABOLIC PANEL
Anion gap: 8 (ref 5–15)
BUN: 7 mg/dL (ref 6–20)
CO2: 23 mmol/L (ref 22–32)
Calcium: 9.2 mg/dL (ref 8.9–10.3)
Chloride: 106 mmol/L (ref 98–111)
Creatinine, Ser: 0.66 mg/dL (ref 0.44–1.00)
GFR calc Af Amer: >60 mL/min (ref >60)
GFR calc non Af Amer: >60 mL/min (ref >60)
Glucose, Bld: 90 mg/dL (ref 70–99)
Potassium: 3.7 mmol/L (ref 3.5–5.1)
Sodium: 137 mmol/L (ref 135–145)

## 2020-06-01 LAB — CBC
HCT: 41.5 % (ref 36.0–46.0)
Hemoglobin: 13.6 g/dL (ref 12.0–15.0)
MCH: 30.8 pg (ref 26.0–34.0)
MCHC: 32.8 g/dL (ref 30.0–36.0)
MCV: 93.9 fL (ref 80.0–100.0)
Platelets: 386 10*3/uL (ref 150–400)
RBC: 4.42 MIL/uL (ref 3.87–5.11)
RDW: 13.3 % (ref 11.5–15.5)
WBC: 9.3 10*3/uL (ref 4.0–10.5)
nRBC: 0 % (ref 0.0–0.2)

## 2020-06-01 LAB — TROPONIN I (HIGH SENSITIVITY): Troponin I (High Sensitivity): <2 ng/L (ref <18)

## 2020-06-01 LAB — I-STAT BETA HCG BLOOD, ED (MC, WL, AP ONLY): I-stat hCG, quantitative: <5 m[IU]/mL (ref <5)

## 2020-06-01 NOTE — Progress Notes (Addendum)
Right lower extremity venous duplex completed. Refer to "CV Proc" under chart review to view preliminary results.  06/01/2020 7:51 PM Eula Fried., MHA, RVT, RDCS, RDMS

## 2020-06-01 NOTE — ED Triage Notes (Signed)
Pt here with c/o back pain sob and chest pain , pt is a smoker

## 2020-06-01 NOTE — ED Notes (Signed)
Pt. Stated, I probably have a blood clot in my right bend in the knees

## 2020-06-01 NOTE — ED Notes (Signed)
Pt leaving due to wait time. 

## 2020-06-04 ENCOUNTER — Ambulatory Visit: Payer: Medicaid Other | Admitting: Obstetrics & Gynecology

## 2020-06-05 ENCOUNTER — Ambulatory Visit (INDEPENDENT_AMBULATORY_CARE_PROVIDER_SITE_OTHER): Payer: Medicaid Other | Admitting: Nurse Practitioner

## 2020-06-05 ENCOUNTER — Other Ambulatory Visit: Payer: Self-pay

## 2020-06-05 ENCOUNTER — Encounter: Payer: Self-pay | Admitting: Nurse Practitioner

## 2020-06-05 ENCOUNTER — Telehealth: Payer: Self-pay | Admitting: Nurse Practitioner

## 2020-06-05 VITALS — BP 120/82 | HR 75 | Temp 97.7°F | Ht 62.0 in | Wt 149.0 lb

## 2020-06-05 DIAGNOSIS — R9431 Abnormal electrocardiogram [ECG] [EKG]: Secondary | ICD-10-CM

## 2020-06-05 DIAGNOSIS — E538 Deficiency of other specified B group vitamins: Secondary | ICD-10-CM

## 2020-06-05 DIAGNOSIS — M79604 Pain in right leg: Secondary | ICD-10-CM

## 2020-06-05 DIAGNOSIS — F419 Anxiety disorder, unspecified: Secondary | ICD-10-CM | POA: Diagnosis not present

## 2020-06-05 DIAGNOSIS — M79606 Pain in leg, unspecified: Secondary | ICD-10-CM | POA: Insufficient documentation

## 2020-06-05 DIAGNOSIS — F17290 Nicotine dependence, other tobacco product, uncomplicated: Secondary | ICD-10-CM

## 2020-06-05 DIAGNOSIS — F411 Generalized anxiety disorder: Secondary | ICD-10-CM | POA: Insufficient documentation

## 2020-06-05 DIAGNOSIS — R079 Chest pain, unspecified: Secondary | ICD-10-CM | POA: Diagnosis not present

## 2020-06-05 DIAGNOSIS — R519 Headache, unspecified: Secondary | ICD-10-CM | POA: Insufficient documentation

## 2020-06-05 DIAGNOSIS — G8929 Other chronic pain: Secondary | ICD-10-CM | POA: Diagnosis not present

## 2020-06-05 HISTORY — DX: Abnormal electrocardiogram (ECG) (EKG): R94.31

## 2020-06-05 LAB — TSH: TSH: 3.51 u[IU]/mL (ref 0.35–4.50)

## 2020-06-05 LAB — HEMOGLOBIN A1C: Hgb A1c MFr Bld: 5.4 % (ref 4.6–6.5)

## 2020-06-05 LAB — VITAMIN D 25 HYDROXY (VIT D DEFICIENCY, FRACTURES): VITD: 27.07 ng/mL — ABNORMAL LOW (ref 30.00–100.00)

## 2020-06-05 LAB — B12 AND FOLATE PANEL
Folate: 4.4 ng/mL — ABNORMAL LOW (ref >5.9)
Vitamin B-12: 124 pg/mL — ABNORMAL LOW (ref 211–911)

## 2020-06-05 MED ORDER — CHOLECALCIFEROL 25 MCG (1000 UT) PO TABS: 1000.0000 [IU] | ORAL_TABLET | Freq: Every day | ORAL | 0 refills | Status: DC

## 2020-06-05 MED ORDER — AMITRIPTYLINE HCL 25 MG PO TABS: 25.0000 mg | ORAL_TABLET | Freq: Every day | ORAL | 1 refills | Status: DC

## 2020-06-05 NOTE — Patient Instructions (Signed)
Please go to the lab today and we will call you with results.  I ordered a CT of the head to look for causes of your daily headache.  I also ordered a neurology referral.  For anxiety have ordered referral to psychiatry with Dr. Maryruth Bun.  Per insurance does not cover you, will need to make referral to another provider.  For your chest pain, and the EKG that showed very slight non specific findings in the emergency room, I have ordered cardiology referral.  You have declined the Covid vaccine.  Please follow-up office visit in 3 weeks to go over results make further recommendations.

## 2020-06-05 NOTE — Telephone Encounter (Signed)
Error

## 2020-06-05 NOTE — Progress Notes (Signed)
New Patient Office Visit  Subjective:  Patient ID: Heather [name], female    DOB: 1981/01/29  Age: 39 y.o. MRN: [id number]  CC:  Chief Complaint  Patient presents with  . New Patient (Initial Visit)    establish care    HPI Heather [name] is a healthy 39 year old who presents to establish care with primary care provider.  Her main concern today is anxiety.  Anxiety : She recalls having anxiety as a child.  She took medications intermittently through the years.  She recalls that medications really never helped her much.  She said that Lexapro and Zoloft did not  work.  Her last physician gave her Klonopin as needed.  She switched primary care providers and the providers at [facility] would no longer refill her Klonopin.  She has not seen a psychiatrist in many years.  She did wake up in the night with an anxiety attack.  She went to urgent care and reports her EKG was normal.  She would be willing to see a psychiatrist at this time.    Right leg pain: She has had discomfort intermittently in the right leg for the last 1-1/2 months.  It is occurred randomly.  For the last 3 days its been a constant pain lower leg and around knee and down to foot cramps. Sitting in car or walking on it or at the right foot. Foot cramped with driving. She woke up with pain today and now a 2 out of 10 . She has poor circulation in it x 10 years and as she gets older it gets worse. She had a doppler done in the ED with no blood clot seen. Summary:  RIGHT:  - There is no evidence of deep vein thrombosis in the lower extremity.    - No cystic structure found in the popliteal fossa.    Daily HA for years- takes Goody's everyday-2-4 packets daily for 2 months and prior Ibuprofen 800 mg Rx in the past a few years. She has HA top of her head above forehead, sharp and sometimes pounding. The HA is all of the time- 18 hours a day x years. Hx of viral meningitis 10 years ago  and SX was HA 24 hours a day for 10  days. She has had constant HA daily for 2 years now.     Past Medical History:  Diagnosis Date  . Anemia    History of  . Anxiety    stopped meds with pregnancy  . Chicken pox   . Frequent headaches   . Paresthesia of both feet   . Tobacco abuse   . UTI (urinary tract infection)     Past Surgical History:  Procedure Laterality Date  . ANTERIOR AND POSTERIOR REPAIR N/A 08/04/2015   Procedure: ANTERIOR (CYSTOCELE) AND POSTERIOR REPAIR (RECTOCELE);  Surgeon: Allie Bossier, MD;  Location: [facility];  Service: Gynecology;  Laterality: N/A;  . HERNIA REPAIR     when 8 wks old  . LAPAROSCOPIC BILATERAL SALPINGECTOMY Bilateral 08/04/2015   Procedure: LAPAROSCOPIC BILATERAL SALPINGECTOMY;  Surgeon: Allie Bossier, MD;  Location: [facility];  Service: Gynecology;  Laterality: Bilateral;  . WISDOM TOOTH EXTRACTION      Family History  Problem Relation Age of Onset  . Neuropathy Mother   . Breast cancer Mother   . Alcohol abuse Mother   . Drug abuse Mother   . Thyroid disease Father   . Alcohol abuse Father   . Arthritis Father   .  Asthma Father   . COPD Father   . Depression Father   . Drug abuse Father   . Early death Father   . Mental illness Father   . Breast cancer Paternal Grandmother   . Autism Son   . ADD / ADHD Son   . Birth defects Neg Hx   . Cancer Neg Hx   . Diabetes Neg Hx   . Hearing loss Neg Hx   . Heart disease Neg Hx   . Hyperlipidemia Neg Hx   . Hypertension Neg Hx   . Kidney disease Neg Hx   . Learning disabilities Neg Hx   . Mental retardation Neg Hx   . Miscarriages / Stillbirths Neg Hx   . Stroke Neg Hx   . Vision loss Neg Hx   . Varicose Veins Neg Hx     Social History   Socioeconomic History  . Marital status: Married    Spouse name: Jill Alexanders  . Number of children: 4  . Years of education: Not on file  . Highest education level: Not on file  Occupational History  . Not on file  Tobacco Use  . Smoking status: Current Every Day Smoker     Packs/day: 1.00    Years: 20.00    Pack years: 20.00    Types: Cigarettes  . Smokeless tobacco: Never Used  Vaping Use  . Vaping Use: Never used  Substance and Sexual Activity  . Alcohol use: Yes    Comment: rare  . Drug use: No  . Sexual activity: Yes    Birth control/protection: Surgical  Other Topics Concern  . Not on file  Social History Narrative  . Not on file   Social Determinants of Health   Financial Resource Strain:   . Difficulty of Paying Living Expenses: Not on file  Food Insecurity:   . Worried About Programme researcher, broadcasting/film/video in the Last Year: Not on file  . Ran Out of Food in the Last Year: Not on file  Transportation Needs:   . Lack of Transportation (Medical): Not on file  . Lack of Transportation (Non-Medical): Not on file  Physical Activity:   . Days of Exercise per Week: Not on file  . Minutes of Exercise per Session: Not on file  Stress:   . Feeling of Stress : Not on file  Social Connections:   . Frequency of Communication with Friends and Family: Not on file  . Frequency of Social Gatherings with Friends and Family: Not on file  . Attends Religious Services: Not on file  . Active Member of Clubs or Organizations: Not on file  . Attends Banker Meetings: Not on file  . Marital Status: Not on file  Intimate Partner Violence:   . Fear of Current or Ex-Partner: Not on file  . Emotionally Abused: Not on file  . Physically Abused: Not on file  . Sexually Abused: Not on file    Review of Systems  Constitutional: Positive for unexpected weight change. Negative for appetite change, chills, fatigue and fever.       Wt is up and down Wt Readings from Last 3 Encounters: 06/05/20 : 149 lb (67.6 kg) 01/23/20 : 135 lb (61.2 kg) 04/11/19 : 146 lb (66.2 kg)  HENT: Negative.  Negative for congestion, dental problem and sinus pressure.   Eyes: Negative.        Blurred at night and sees eye doctor  Respiratory: Negative for cough and shortness of  breath.  Shortness of breath night  when she lays down. Feels like a nostril is stopped up. Anxious- wake sup smokes cigarette and has to void.   Cardiovascular: Positive for chest pain. Negative for palpitations and leg swelling.       Positive at night - when she lays down at night to left of breast in center- sharp pain across chest to left arm pit- lasts minutes and resolves. She has tried Tums-does not help.  Or,  she sits up with it in the middle of the night. No exertional CP.No DOE.   Gastrointestinal: Negative.   Endocrine: Negative for cold intolerance and heat intolerance.  Genitourinary: Negative for difficulty urinating, dysuria and frequency.       Nocturia x 1    Musculoskeletal:       Right leg pain- intermittent  Skin: Negative for rash.  Neurological: Positive for dizziness, numbness and headaches. Negative for tremors, seizures, syncope, speech difficulty and weakness.  Hematological: Negative for adenopathy. Does not bruise/bleed easily.  Psychiatric/Behavioral:       Postive   PHQ9:% GAD-7:14     Objective:   Today's Vitals: BP 120/82 (BP Location: Left Arm, Patient Position: Sitting, Cuff Size: Normal)   Pulse 75   Temp 97.7 F (36.5 C) (Oral)   Ht 5\' 2"  (1.575 m)   Wt 149 lb (67.6 kg)   SpO2 99%   BMI 27.25 kg/m   Physical Exam Vitals reviewed.  Constitutional:      Appearance: Normal appearance.  HENT:     Head: Normocephalic and atraumatic.  Eyes:     Conjunctiva/sclera: Conjunctivae normal.     Pupils: Pupils are equal, round, and reactive to light.  Cardiovascular:     Rate and Rhythm: Normal rate and regular rhythm.     Pulses: Normal pulses.     Heart sounds: Normal heart sounds.  Pulmonary:     Effort: Pulmonary effort is normal.     Breath sounds: Normal breath sounds.  Abdominal:     Palpations: Abdomen is soft.     Tenderness: There is no abdominal tenderness.  Musculoskeletal:        General: No swelling or deformity. Normal  range of motion.     Cervical back: Normal range of motion and neck supple.     Right lower leg: No edema.  Skin:    General: Skin is warm and dry.  Neurological:     General: No focal deficit present.     Mental Status: She is alert and oriented to person, place, and time.  Psychiatric:        Mood and Affect: Mood normal.        Behavior: Behavior normal.     Assessment & Plan:   Problem List Items Addressed This Visit      Other   Chronic nonintractable headache - Primary   Relevant Medications   amitriptyline (ELAVIL) 25 MG tablet   Other Relevant Orders   B12 and Folate Panel (Completed)   Vitamin B1 (Completed)   Ambulatory referral to Neurology   CT Head Wo Contrast   Nicotine dependence due to vaping tobacco product   Anxiety   Relevant Medications   amitriptyline (ELAVIL) 25 MG tablet   Other Relevant Orders   Ambulatory referral to Psychiatry   Hemoglobin A1c (Completed)   VITAMIN D 25 Hydroxy (Vit-D Deficiency, Fractures) (Completed)   TSH (Completed)   B12 and Folate Panel (Completed)   Vitamin B1 (Completed)   Chest pain  Relevant Orders   Ambulatory referral to Cardiology   Abnormal EKG   Relevant Orders   Ambulatory referral to Cardiology   Right leg pain    Other Visit Diagnoses    B12 deficiency       Relevant Orders   Intrinsic Factor Antibodies   Methylmalonic Acid   B12 and Folate Panel      Outpatient Encounter Medications as of 06/05/2020  Medication Sig  . Aspirin-Acetaminophen (GOODYS BODY PAIN PO) Take by mouth.   Marland Kitchen. amitriptyline (ELAVIL) 25 MG tablet Take 1 tablet (25 mg total) by mouth at bedtime.  . Cholecalciferol 25 MCG (1000 UT) tablet Take 1 tablet (1,000 Units total) by mouth daily.  . cyanocobalamin (,VITAMIN B-12,) 1000 MCG/ML injection 1000 mcg (1 mg) injection once per week for four weeks, followed by 1000 mcg injection once per month.  . [DISCONTINUED] acetaminophen (TYLENOL) 500 MG tablet Take 1 tablet (500 mg total)  by mouth every 6 (six) hours as needed. (Patient not taking: Reported on 04/11/2019)  . [DISCONTINUED] ciprofloxacin (CIPRO) 500 MG tablet Take 1 tablet (500 mg total) by mouth 2 (two) times daily. (Patient not taking: Reported on 04/11/2019)  . [DISCONTINUED] ibuprofen (ADVIL,MOTRIN) 800 MG tablet Take 1 tablet (800 mg total) by mouth 3 (three) times daily with meals as needed for headache, moderate pain or cramping. (Patient not taking: Reported on 04/11/2019)  . [DISCONTINUED] Vitamin D, Ergocalciferol, (DRISDOL) 50000 units CAPS capsule Take 1 capsule (50,000 Units total) by mouth every 7 (seven) days. For eight weeks (Patient not taking: Reported on 04/11/2019)   No facility-administered encounter medications on file as of 06/05/2020.   Please go to the lab today and we will call you with results.  I ordered a CT of the head to look for causes of your daily headache.  I also ordered a neurology referral.  For anxiety have ordered referral to psychiatry with Dr. Maryruth BunKapur.  Per insurance does not cover you, will need to make referral to another provider.  For your chest pain, and the EKG that showed very slight non specific findings in the emergency room, I have ordered cardiology referral.  You have declined the Covid vaccine.  Please follow-up office visit in 3 weeks to go over results make further recommendations.   Addendum: Pt was notified: Vit D is mildly low- may take Vit D 3 1000 IU daily.  And B12 and folate are very low- Needs lab appt: additional lab work -intrinsic factor and MMA. Please set up nurse visit for B12 1000 mcg IM injections- weekly x 4 and monthly to follow. Begin Thiamine for low B1. All labs discussed with the patient.   Right leg pain: Recommend [facility] pt will see if they take her insurance.   Follow-up: Return in about 3 weeks (around 06/26/2020).  This visit occurred during the SARS-CoV-2 public health emergency.  Safety protocols were in place,  including screening questions prior to the visit, additional usage of staff PPE, and extensive cleaning of exam room while observing appropriate contact time as indicated for disinfecting solutions.   Amedeo KinsmanKimberly Elgie Maziarz, NP

## 2020-06-08 ENCOUNTER — Encounter: Payer: Self-pay | Admitting: Neurology

## 2020-06-08 ENCOUNTER — Telehealth: Payer: Self-pay | Admitting: Nurse Practitioner

## 2020-06-08 NOTE — Telephone Encounter (Signed)
Pt called to go over lab results.  

## 2020-06-09 LAB — VITAMIN B1: Vitamin B1 (Thiamine): 6 nmol/L — ABNORMAL LOW (ref 8–30)

## 2020-06-09 MED ORDER — THIAMINE HCL 100 MG PO TABS: 100.0000 mg | ORAL_TABLET | Freq: Every day | ORAL | 1 refills | Status: DC

## 2020-06-09 MED ORDER — CYANOCOBALAMIN 1000 MCG/ML IJ SOLN: INTRAMUSCULAR | 3 refills | Status: DC

## 2020-06-10 ENCOUNTER — Other Ambulatory Visit: Payer: Self-pay

## 2020-06-10 ENCOUNTER — Telehealth: Payer: Self-pay | Admitting: Nurse Practitioner

## 2020-06-10 MED ORDER — THIAMINE HCL 100 MG PO TABS
100.0000 mg | ORAL_TABLET | Freq: Every day | ORAL | 1 refills | Status: DC
Start: 2020-06-10 — End: 2022-02-17

## 2020-06-10 NOTE — Telephone Encounter (Signed)
Patient called and wanted Heather [name] to know her pharmacy is currently out of thiamine. Please advise patient.

## 2020-06-10 NOTE — Telephone Encounter (Signed)
[facility] [place] since [facility] does not have this prescription. Rx resent to [facility].

## 2020-06-12 DIAGNOSIS — H5213 Myopia, bilateral: Secondary | ICD-10-CM | POA: Diagnosis not present

## 2020-06-14 ENCOUNTER — Telehealth: Payer: Self-pay | Admitting: Nurse Practitioner

## 2020-06-14 DIAGNOSIS — Z20822 Contact with and (suspected) exposure to covid-19: Secondary | ICD-10-CM

## 2020-06-14 NOTE — Telephone Encounter (Signed)
Her insurance was not accepted by Dr. Maryruth Bun and the Pysch consult had to be canceled.   Please call her and advise that she contact one of these and inquire if they take her Medicaid [employer]. I also sent her My Chart about this. Very important that she follow through.  Please make an appointment with psychiatry and here are 3 places to call.    [facility] :  [phone]  [facility]:  [phone]  [facility]: [phone]

## 2020-06-15 NOTE — Telephone Encounter (Signed)
Patient aware antibody test was ordered

## 2020-06-15 NOTE — Telephone Encounter (Signed)
I spoke with patient and she is going to try to call around to see if anyone can take her. She did [facility] and they are not taking any new patients. Patient also wants to know if she can have the antibody test done when she comes for labs on 06/17/20?

## 2020-06-15 NOTE — Telephone Encounter (Signed)
Patient read mychart message and has information on psy options below.

## 2020-06-15 NOTE — Telephone Encounter (Signed)
Yes, please add Sars IgG dx exposure to Covid

## 2020-06-17 ENCOUNTER — Other Ambulatory Visit: Payer: Self-pay

## 2020-06-17 ENCOUNTER — Other Ambulatory Visit (INDEPENDENT_AMBULATORY_CARE_PROVIDER_SITE_OTHER): Payer: Medicaid Other

## 2020-06-17 ENCOUNTER — Ambulatory Visit (INDEPENDENT_AMBULATORY_CARE_PROVIDER_SITE_OTHER): Payer: Medicaid Other

## 2020-06-17 DIAGNOSIS — E538 Deficiency of other specified B group vitamins: Secondary | ICD-10-CM

## 2020-06-17 DIAGNOSIS — Z20822 Contact with and (suspected) exposure to covid-19: Secondary | ICD-10-CM

## 2020-06-17 LAB — B12 AND FOLATE PANEL
Folate: 3.8 ng/mL — ABNORMAL LOW (ref >5.9)
Vitamin B-12: 414 pg/mL (ref 211–911)

## 2020-06-17 LAB — SARS-COV-2 IGG: SARS-COV-2 IgG: 0.03

## 2020-06-17 MED ORDER — CYANOCOBALAMIN 1000 MCG/ML IJ SOLN
1000.0000 ug | Freq: Once | INTRAMUSCULAR | Status: AC
  Administered 2020-06-17: 1000 ug via INTRAMUSCULAR

## 2020-06-17 NOTE — Progress Notes (Signed)
Patient presented for B 12 injection teaching. Patients husband was instructed on how to give B12 injection and then demonstrated by giving patient her B12 injection in the left deltoid, patient voiced no concerns nor showed any signs of distress during injection.

## 2020-06-20 LAB — INTRINSIC FACTOR ANTIBODIES: Intrinsic Factor: NEGATIVE

## 2020-06-20 LAB — METHYLMALONIC ACID, SERUM: Methylmalonic Acid, Quant: 52 nmol/L — ABNORMAL LOW (ref 87–318)

## 2020-06-22 ENCOUNTER — Encounter: Payer: Self-pay | Admitting: Cardiology

## 2020-06-22 ENCOUNTER — Ambulatory Visit (INDEPENDENT_AMBULATORY_CARE_PROVIDER_SITE_OTHER): Payer: Medicaid Other | Admitting: Cardiology

## 2020-06-22 ENCOUNTER — Telehealth: Payer: Self-pay | Admitting: Nurse Practitioner

## 2020-06-22 ENCOUNTER — Other Ambulatory Visit: Payer: Self-pay

## 2020-06-22 ENCOUNTER — Ambulatory Visit
Admission: RE | Admit: 2020-06-22 | Discharge: 2020-06-22 | Disposition: A | Discharge status: Home / Self Care | Payer: Medicaid Other | Source: Ambulatory Visit | Attending: Nurse Practitioner | Admitting: Nurse Practitioner

## 2020-06-22 VITALS — BP 104/62 | HR 72 | Ht 62.0 in | Wt 148.0 lb

## 2020-06-22 DIAGNOSIS — R519 Headache, unspecified: Secondary | ICD-10-CM | POA: Insufficient documentation

## 2020-06-22 DIAGNOSIS — R079 Chest pain, unspecified: Secondary | ICD-10-CM | POA: Diagnosis not present

## 2020-06-22 DIAGNOSIS — F17200 Nicotine dependence, unspecified, uncomplicated: Secondary | ICD-10-CM

## 2020-06-22 DIAGNOSIS — R0602 Shortness of breath: Secondary | ICD-10-CM | POA: Diagnosis not present

## 2020-06-22 DIAGNOSIS — G8929 Other chronic pain: Secondary | ICD-10-CM | POA: Insufficient documentation

## 2020-06-22 MED ORDER — IVABRADINE HCL 5 MG PO TABS: 10.0000 mg | ORAL_TABLET | Freq: Once | ORAL | 0 refills | Status: AC

## 2020-06-22 MED ORDER — METOPROLOL TARTRATE 100 MG PO TABS: 100.0000 mg | ORAL_TABLET | Freq: Once | ORAL | 0 refills | Status: DC

## 2020-06-22 NOTE — Telephone Encounter (Signed)
Patient was returning call about what she has discuss with Cala Bradford earlier

## 2020-06-22 NOTE — Patient Instructions (Signed)
Medication Instructions:  Your physician recommends that you continue on your current medications as directed. Please refer to the Current Medication list given to you today.  *If you need a refill on your cardiac medications before your next appointment, please call your pharmacy*  Lab Work: Your physician recommends that you return for lab work once you know the date of the Cardiac CTA.  - Get within the week prior to the Cardiac [facility].  - Please go to the [facility]. You will check in at the front desk to the right as you walk into the atrium. Valet Parking is offered if needed. - No appointment needed. You may go any day between 7 am and 6 pm.  If you have labs (blood work) drawn today and your tests are completely normal, you will receive your results only by: . MyChart Message (if you have MyChart) OR . A paper copy in the mail If you have any lab test that is abnormal or we need to change your treatment, we will call you to review the results.   Testing/Procedures: Your physician has requested that you have an echocardiogram. Echocardiography is a painless test that uses sound waves to create images of your heart. It provides your doctor with information about the size and shape of your heart and how well your heart's chambers and valves are working. This procedure takes approximately one hour. There are no restrictions for this procedure. You may get an IV, if needed, to receive an ultrasound enhancing agent through to better visualize your heart.    Cardiac CTA Your cardiac CT will be scheduled at one of the below locations:   [facility] [address] [place], [place] [address] [phone]  OR  [facility] [address] [place], [place] [address] [phone]  If scheduled at [facility], please arrive at the North Tower main entrance of [facility] 30 minutes prior to  test start time. Proceed to the [place] [facility] Department (first floor) to check-in and test prep.  If scheduled at [facility], please arrive 15 mins early for check-in and test prep.  Please follow these instructions carefully (unless otherwise directed):  On the Night Before the Test: . Be sure to Drink plenty of water. . Do not consume any caffeinated/decaffeinated beverages or chocolate 12 hours prior to your test. . Do not take any antihistamines 12 hours prior to your test.   On the Day of the Test: . Drink plenty of water. Do not drink any water within one hour of the test. . Do not eat any food 4 hours prior to the test. . You may take your regular medications prior to the test.  . Take metoprolol (Lopressor) two hours prior to test. . HOLD Furosemide/Hydrochlorothiazide morning of the test. . FEMALES- please wear underwire-free bra if available       After the Test: . Drink plenty of water. . After receiving IV contrast, you may experience a mild flushed feeling. This is normal. . On occasion, you may experience a mild rash up to 24 hours after the test. This is not dangerous. If this occurs, you can take Benadryl 25 mg and increase your fluid intake. . If you experience trouble breathing, this can be serious. If it is severe call 911 IMMEDIATELY. If it is mild, please call our office. . If you take any of these medications: Glipizide/Metformin, Avandament,   Glucavance, please do not take 48 hours after completing test unless otherwise instructed.   Once we have confirmed authorization from your insurance company, we will call you to set up a date and time for your test. Based on how quickly your insurance processes prior authorizations requests, please allow up to 4 weeks to be contacted for scheduling your Cardiac CT appointment. Be advised that routine Cardiac CT appointments could be scheduled as many as 8 weeks after your provider has  ordered it.  For non-scheduling related questions, please contact the cardiac imaging nurse navigator should you have any questions/concerns: Sara Wallace, Cardiac Imaging Nurse Navigator Merle Tai, Interim Cardiac Imaging Nurse Navigator [facility] Direct Office Dial: [phone]   For scheduling needs, including cancellations and rescheduling, please call Toni at [phone], option [phone].      Follow-Up: At [facility], you and your health needs are our priority.  As part of our continuing mission to provide you with exceptional heart care, we have created designated Provider Care Teams.  These Care Teams include your primary Cardiologist (physician) and Advanced Practice Providers (APPs -  Physician Assistants and Nurse Practitioners) who all work together to provide you with the care you need, when you need it.  We recommend signing up for the patient portal called "MyChart".  Sign up information is provided on this After Visit Summary.  MyChart is used to connect with patients for Virtual Visits (Telemedicine).  Patients are able to view lab/test results, encounter notes, upcoming appointments, etc.  Non-urgent messages can be sent to your provider as well.   To learn more about what you can do with MyChart, go to [email or web address].    Your next appointment:   After testing about 6-8 weeks.  The format for your next appointment:   In Person  Provider:   You may see Dr. Agbor-Etang or one of the following Advanced Practice Providers on your designated Care Team:    Christopher Berge, NP  Ryan Dunn, PA-C  Jacquelyn Visser, PA-C  Cadence Furth, PA-C    Cardiac CT Angiogram A cardiac CT angiogram is a procedure to look at the heart and the area around the heart. It may be done to help find the cause of chest pains or other symptoms of heart disease. During this procedure, a substance called contrast dye is injected into the blood vessels in  the area to be checked. A large X-ray machine, called a CT scanner, then takes detailed pictures of the heart and the surrounding area. The procedure is also sometimes called a coronary CT angiogram, coronary artery scanning, or CTA. A cardiac CT angiogram allows the health care provider to see how well blood is flowing to and from the heart. The health care provider will be able to see if there are any problems, such as:  Blockage or narrowing of the coronary arteries in the heart.  Fluid around the heart.  Signs of weakness or disease in the muscles, valves, and tissues of the heart. Tell a health care provider about:  Any allergies you have. This is especially important if you have had a previous allergic reaction to contrast dye.  All medicines you are taking, including vitamins, herbs, eye drops, creams, and over-the-counter medicines.  Any blood disorders you have.  Any surgeries you have had.  Any medical conditions you have.  Whether you are pregnant or may be pregnant.  Any anxiety disorders, chronic pain, or other conditions you have that may increase   your stress or prevent you from lying still. What are the risks? Generally, this is a safe procedure. However, problems may occur, including:  Bleeding.  Infection.  Allergic reactions to medicines or dyes.  Damage to other structures or organs.  Kidney damage from the contrast dye that is used.  Increased risk of cancer from radiation exposure. This risk is low. Talk with your health care provider about: ? The risks and benefits of testing. ? How you can receive the lowest dose of radiation. What happens before the procedure?  Wear comfortable clothing and remove any jewelry, glasses, dentures, and hearing aids.  Follow instructions from your health care provider about eating and drinking. This may include: ? For 12 hours before the procedure -- avoid caffeine. This includes tea, coffee, soda, energy drinks, and  diet pills. Drink plenty of water or other fluids that do not have caffeine in them. Being well hydrated can prevent complications. ? For 4-6 hours before the procedure -- stop eating and drinking. The contrast dye can cause nausea, but this is less likely if your stomach is empty.  Ask your health care provider about changing or stopping your regular medicines. This is especially important if you are taking diabetes medicines, blood thinners, or medicines to treat problems with erections (erectile dysfunction). What happens during the procedure?   Hair on your chest may need to be removed so that small sticky patches called electrodes can be placed on your chest. These will transmit information that helps to monitor your heart during the procedure.  An IV will be inserted into one of your veins.  You might be given a medicine to control your heart rate during the procedure. This will help to ensure that good images are obtained.  You will be asked to lie on an exam table. This table will slide in and out of the CT machine during the procedure.  Contrast dye will be injected into the IV. You might feel warm, or you may get a metallic taste in your mouth.  You will be given a medicine called nitroglycerin. This will relax or dilate the arteries in your heart.  The table that you are lying on will move into the CT machine tunnel for the scan.  The person running the machine will give you instructions while the scans are being done. You may be asked to: ? Keep your arms above your head. ? Hold your breath. ? Stay very still, even if the table is moving.  When the scanning is complete, you will be moved out of the machine.  The IV will be removed. The procedure may vary among health care providers and hospitals. What can I expect after the procedure? After your procedure, it is common to have:  A metallic taste in your mouth from the contrast dye.  A feeling of warmth.  A headache  from the nitroglycerin. Follow these instructions at home:  Take over-the-counter and prescription medicines only as told by your health care provider.  If you are told, drink enough fluid to keep your urine pale yellow. This will help to flush the contrast dye out of your body.  Most people can return to their normal activities right after the procedure. Ask your health care provider what activities are safe for you.  It is up to you to get the results of your procedure. Ask your health care provider, or the department that is doing the procedure, when your results will be ready.  Keep all  follow-up visits as told by your health care provider. This is important. Contact a health care provider if:  You have any symptoms of allergy to the contrast dye. These include: ? Shortness of breath. ? Rash or hives. ? A racing heartbeat. Summary  A cardiac CT angiogram is a procedure to look at the heart and the area around the heart. It may be done to help find the cause of chest pains or other symptoms of heart disease.  During this procedure, a large X-ray machine, called a CT scanner, takes detailed pictures of the heart and the surrounding area after a contrast dye has been injected into blood vessels in the area.  Ask your health care provider about changing or stopping your regular medicines before the procedure. This is especially important if you are taking diabetes medicines, blood thinners, or medicines to treat erectile dysfunction.  If you are told, drink enough fluid to keep your urine pale yellow. This will help to flush the contrast dye out of your body. This information is not intended to replace advice given to you by your health care provider. Make sure you discuss any questions you have with your health care provider. Document Revised: 04/17/2019 Document Reviewed: 04/17/2019 [employer] Patient Education  2020 [employer]    

## 2020-06-22 NOTE — Progress Notes (Signed)
Cardiology Office Note:    Date:  06/22/2020   ID:  Heather [name], DOB [birth year], MRN [id number]  PCP:  Marval Regal, NP  [facility] Cardiologist:  No primary care provider on file.  [facility] Electrophysiologist:  None   Referring MD: Marval Regal, NP   Chief Complaint  Patient presents with  . New Patient (Initial Visit)    Referred for CP and abn EKG  Pt states she was having CP in May this year---thinks it is anxiety; c/o pain in right leg, will occasionally "give out" when walking, comes and goes. States her mother has peripheral neuropathy. Pt states on 9/27, was having stabbing chest pain and could not breathe, went by ambulance to ED---was there 9 1/2 hours and left    History of Present Illness:    Heather [name] is a 39 y.o. female with a hx of anxiety, chest pain, current smoker x20 years. Patient states having chest pain over the past 6 months. Initial symptoms 6 months ago occurred while patient was asleep. She woke up from sleep with severe chest tightness, told her husband to call EMS while he was about to call, patient states symptoms improved. He attributed this to panic attack. Symptoms were on and off until 3 weeks ago when patient was at home, just returned from having breakfast with family. States having severe chest tightness, radiating down her left arm associated with shortness of breath. She called EMS, was brought to the emergency room. Work-up with EKG and troponin did not show any acute ischemia. Had a lower extremity ultrasound to evaluate DVT which was negative. Due to long wait times, patient did not stay for full work-up and went home. She states having occasional chest discomfort sometimes not associated with exertion. She is a current smoker. Denies any other history of heart disease.  Past Medical History:  Diagnosis Date  . Anemia    History of  . Anxiety    stopped meds with pregnancy  . Chicken pox   . Frequent  headaches   . Paresthesia of both feet   . Tobacco abuse   . UTI (urinary tract infection)     Past Surgical History:  Procedure Laterality Date  . ANTERIOR AND POSTERIOR REPAIR N/A 08/04/2015   Procedure: ANTERIOR (CYSTOCELE) AND POSTERIOR REPAIR (RECTOCELE);  Surgeon: Emily Filbert, MD;  Location: [facility];  Service: Gynecology;  Laterality: N/A;  . HERNIA REPAIR     when 95 wks old  . LAPAROSCOPIC BILATERAL SALPINGECTOMY Bilateral 08/04/2015   Procedure: LAPAROSCOPIC BILATERAL SALPINGECTOMY;  Surgeon: Emily Filbert, MD;  Location: [facility];  Service: Gynecology;  Laterality: Bilateral;  . WISDOM TOOTH EXTRACTION      Current Medications: Current Meds  Medication Sig  . Aspirin-Acetaminophen (GOODYS BODY PAIN PO) Take by mouth.   . Cholecalciferol 25 MCG (1000 UT) tablet Take 1 tablet (1,000 Units total) by mouth daily.  . cyanocobalamin (,VITAMIN B-12,) 1000 MCG/ML injection 1000 mcg (1 mg) injection once per week for four weeks, followed by 1000 mcg injection once per month.  . thiamine 100 MG tablet Take 1 tablet (100 mg total) by mouth daily.     Allergies:   Patient has no known allergies.   Social History   Socioeconomic History  . Marital status: Married    Spouse name: Larkin Ina  . Number of children: 4  . Years of education: Not on file  . Highest education level: Not on file  Occupational  History  . Not on file  Tobacco Use  . Smoking status: Current Every Day Smoker    Packs/day: 1.00    Years: 20.00    Pack years: 20.00    Types: Cigarettes  . Smokeless tobacco: Never Used  Vaping Use  . Vaping Use: Never used  Substance and Sexual Activity  . Alcohol use: Yes    Comment: rare  . Drug use: No  . Sexual activity: Yes    Birth control/protection: Surgical  Other Topics Concern  . Not on file  Social History Narrative  . Not on file   Social Determinants of Health   Financial Resource Strain:   . Difficulty of Paying Living Expenses: Not on file  Food  Insecurity:   . Worried About Charity fundraiser in the Last Year: Not on file  . Ran Out of Food in the Last Year: Not on file  Transportation Needs:   . Lack of Transportation (Medical): Not on file  . Lack of Transportation (Non-Medical): Not on file  Physical Activity:   . Days of Exercise per Week: Not on file  . Minutes of Exercise per Session: Not on file  Stress:   . Feeling of Stress : Not on file  Social Connections:   . Frequency of Communication with Friends and Family: Not on file  . Frequency of Social Gatherings with Friends and Family: Not on file  . Attends Religious Services: Not on file  . Active Member of Clubs or Organizations: Not on file  . Attends Archivist Meetings: Not on file  . Marital Status: Not on file     Family History: The patient's family history includes ADD / ADHD in her son; Alcohol abuse in her father and mother; Arthritis in her father; Asthma in her father; Autism in her son; Breast cancer in her mother and paternal grandmother; COPD in her father; Depression in her father; Drug abuse in her father and mother; Early death in her father; Mental illness in her father; Neuropathy in her mother; Thyroid disease in her father. There is no history of Birth defects, Cancer, Diabetes, Hearing loss, Heart disease, Hyperlipidemia, Hypertension, Kidney disease, Learning disabilities, Mental retardation, Miscarriages / Stillbirths, Stroke, Vision loss, or Varicose Veins.  ROS:   Please see the history of present illness.     All other systems reviewed and are negative.  EKGs/Labs/Other Studies Reviewed:    The following studies were reviewed today:   EKG:  EKG is  ordered today.  The ekg ordered today demonstrates normal sinus rhythm, low voltage QRS.  Recent Labs: 06/01/2020: BUN 7; Creatinine, Ser 0.66; Hemoglobin 13.6; Platelets 386; Potassium 3.7; Sodium 137 06/05/2020: TSH 3.51  Recent Lipid Panel    Component Value Date/Time   CHOL  131 04/11/2019 1119   TRIG 65 04/11/2019 1119   HDL 47 04/11/2019 1119   CHOLHDL 2.8 04/11/2019 1119   CHOLHDL 2.5 12/25/2015 1116   VLDL 12 12/25/2015 1116   LDLCALC 71 04/11/2019 1119     Risk Assessment/Calculations:      Physical Exam:    VS:  BP 104/62   Pulse 72   Ht _0  (1.575 m)   Wt 148 lb (67.1 kg)   BMI 27.07 kg/m     Wt Readings from Last 3 Encounters:  06/22/20 148 lb (67.1 kg)  06/05/20 149 lb (67.6 kg)  01/23/20 135 lb (61.2 kg)     GEN:  Well nourished, well developed in no  acute distress HEENT: Normal NECK: No JVD; No carotid bruits LYMPHATICS: No lymphadenopathy CARDIAC: RRR, no murmurs, rubs, gallops RESPIRATORY:  Clear to auscultation without rales, wheezing or rhonchi  ABDOMEN: Soft, non-tender, non-distended MUSCULOSKELETAL:  No edema; No deformity  SKIN: Warm and dry NEUROLOGIC:  Alert and oriented x 3 PSYCHIATRIC:  Normal affect   ASSESSMENT:    1. Chest pain of uncertain etiology   2. Shortness of breath   3. Smoking   4. Chest pain, unspecified type    PLAN:    In order of problems listed above:  1. Patient with history of chest pain, has risk factors of smoking. She is slow to heal intermediate risk for CAD, as such coronary CTA would be ideal. Get coronary CTA to evaluate presence of CAD. If normal, this will go a long way to reassure patient. 2. History of shortness of breath, get echocardiogram to evaluate systolic and diastolic function. 3. Patient is a current smoker, smoking cessation advised.  Follow-up after echo and coronary CTA.   Medication Adjustments/Labs and Tests Ordered: Current medicines are reviewed at length with the patient today.  Concerns regarding medicines are outlined above.  Orders Placed This Encounter  Procedures  . CT CORONARY MORPH W/CTA COR W/SCORE W/CA W/CM &/OR WO/CM  . CT CORONARY FRACTIONAL FLOW RESERVE DATA PREP  . CT CORONARY FRACTIONAL FLOW RESERVE FLUID ANALYSIS  . Basic metabolic  panel  . EKG 12-Lead  . ECHOCARDIOGRAM COMPLETE   Meds ordered this encounter  Medications  . metoprolol tartrate (LOPRESSOR) 100 MG tablet    Sig: Take 1 tablet (100 mg total) by mouth once for 1 dose. Take 2 hours prior to CT.    Dispense:  1 tablet    Refill:  0  . ivabradine (CORLANOR) 5 MG TABS tablet    Sig: Take 2 tablets (10 mg total) by mouth once for 1 dose. Take 2 hours prior to CT with Lopressor.    Dispense:  2 tablet    Refill:  0    Patient Instructions  Medication Instructions:  Your physician recommends that you continue on your current medications as directed. Please refer to the Current Medication list given to you today.  *If you need a refill on your cardiac medications before your next appointment, please call your pharmacy*  Lab Work: Your physician recommends that you return for lab work once you know the date of the Cardiac CTA.  - Get within the week prior to the Cardiac CTA at the [facility].  - Please go to the [facility]. You will check in at the front desk to the right as you walk into the atrium. Valet Parking is offered if needed. - No appointment needed. You may go any day between 7 am and 6 pm.  If you have labs (blood work) drawn today and your tests are completely normal, you will receive your results only by: Marland Kitchen MyChart Message (if you have MyChart) OR . A paper copy in the mail If you have any lab test that is abnormal or we need to change your treatment, we will call you to review the results.   Testing/Procedures: Your physician has requested that you have an echocardiogram. Echocardiography is a painless test that uses sound waves to create images of your heart. It provides your doctor with information about the size and shape of your heart and how well your heart's chambers and valves are working. This procedure takes approximately one hour. There are  no restrictions for this procedure. You may get an IV, if needed, to receive an  ultrasound enhancing agent through to better visualize your heart.    Cardiac CTA Your cardiac CT will be scheduled at one of the below locations:   [facility] [address] [place], [place] [address] ([phone] [place] [address] [place], [place] [address] [phone]  If scheduled at [facility], please arrive at the [facility] main entrance of [facility] 30 minutes prior to test start time. Proceed to the [facility] Radiology Department (first floor) to check-in and test prep.  If scheduled at [facility], please arrive 15 mins early for check-in and test prep.  Please follow these instructions carefully (unless otherwise directed):  On the Night Before the Test: . Be sure to Drink plenty of water. . Do not consume any caffeinated/decaffeinated beverages or chocolate 12 hours prior to your test. . Do not take any antihistamines 12 hours prior to your test.   On the Day of the Test: . Drink plenty of water. Do not drink any water within one hour of the test. . Do not eat any food 4 hours prior to the test. . You may take your regular medications prior to the test.  . Take metoprolol (Lopressor) two hours prior to test. . HOLD Furosemide/Hydrochlorothiazide morning of the test. . FEMALES- please wear underwire-free bra if available       After the Test: . Drink plenty of water. . After receiving IV contrast, you may experience a mild flushed feeling. This is normal. . On occasion, you may experience a mild rash up to 24 hours after the test. This is not dangerous. If this occurs, you can take Benadryl 25 mg and increase your fluid intake. . If you experience trouble breathing, this can be serious. If it is severe call 911 IMMEDIATELY. If it is mild, please call our office. . If you take any of these medications: Glipizide/Metformin, Avandament,  Glucavance, please do not take 48 hours after completing test unless otherwise instructed.   Once we have confirmed authorization from your insurance company, we will call you to set up a date and time for your test. Based on how quickly your insurance processes prior authorizations requests, please allow up to 4 weeks to be contacted for scheduling your Cardiac CT appointment. Be advised that routine Cardiac CT appointments could be scheduled as many as 8 weeks after your provider has ordered it.  For non-scheduling related questions, please contact the cardiac imaging nurse navigator should you have any questions/concerns: Marchia Bond, Cardiac Imaging Nurse Navigator Burley Saver, Interim Cardiac Imaging Nurse [place] [facility] V[facility] Services Direct Office Dial: [phone]   For scheduling needs, including cancellations and rescheduling, please call Vivien Rota at [phone], option 3.      Follow-Up: At [facility], you and your health needs are our priority.  As part of our continuing mission to provide you with exceptional heart care, we have created designated Provider Care Teams.  These Care Teams include your primary Cardiologist (physician) and Advanced Practice Providers (APPs -  Physician Assistants and Nurse Practitioners) who all work together to provide you with the care you need, when you need it.  We recommend signing up for the patient portal called "MyChart".  Sign up information is provided on this After Visit Summary.  MyChart is used to connect with patients for  Virtual Visits (Telemedicine).  Patients are able to view lab/test results, encounter notes, upcoming appointments, etc.  Non-urgent messages can be sent to your provider as well.   To learn more about what you can do with MyChart, go to [email or web address].    Your next appointment:   After testing about 6-8 weeks.  The format for your next appointment:   In Person  Provider:   You may see  Dr. Garen Lah or one of the following Advanced Practice Providers on your designated Care Team:    Murray Hodgkins, NP  Christell Faith, PA-C  Marrianne Mood, PA-C  Cadence Kathlen Mody, [place]    Cardiac CT Angiogram A cardiac CT angiogram is a procedure to look at the heart and the area around the heart. It may be done to help find the cause of chest pains or other symptoms of heart disease. During this procedure, a substance called contrast dye is injected into the blood vessels in the area to be checked. A large X-ray machine, called a CT scanner, then takes detailed pictures of the heart and the surrounding area. The procedure is also sometimes called a coronary CT angiogram, coronary artery scanning, or CTA. A cardiac CT angiogram allows the health care provider to see how well blood is flowing to and from the heart. The health care provider will be able to see if there are any problems, such as:  Blockage or narrowing of the coronary arteries in the heart.  Fluid around the heart.  Signs of weakness or disease in the muscles, valves, and tissues of the heart. Tell a health care provider about:  Any allergies you have. This is especially important if you have had a previous allergic reaction to contrast dye.  All medicines you are taking, including vitamins, herbs, eye drops, creams, and over-the-counter medicines.  Any blood disorders you have.  Any surgeries you have had.  Any medical conditions you have.  Whether you are pregnant or may be pregnant.  Any anxiety disorders, chronic pain, or other conditions you have that may increase your stress or prevent you from lying still. What are the risks? Generally, this is a safe procedure. However, problems may occur, including:  Bleeding.  Infection.  Allergic reactions to medicines or dyes.  Damage to other structures or organs.  Kidney damage from the contrast dye that is used.  Increased risk of cancer from radiation  exposure. This risk is low. Talk with your health care provider about: ? The risks and benefits of testing. ? How you can receive the lowest dose of radiation. What happens before the procedure?  Wear comfortable clothing and remove any jewelry, glasses, dentures, and hearing aids.  Follow instructions from your health care provider about eating and drinking. This may include: ? For 12 hours before the procedure -- avoid caffeine. This includes tea, coffee, soda, energy drinks, and diet pills. Drink plenty of water or other fluids that do not have caffeine in them. Being well hydrated can prevent complications. ? For 4-6 hours before the procedure -- stop eating and drinking. The contrast dye can cause nausea, but this is less likely if your stomach is empty.  Ask your health care provider about changing or stopping your regular medicines. This is especially important if you are taking diabetes medicines, blood thinners, or medicines to treat problems with erections (erectile dysfunction). What happens during the procedure?   Hair on your chest may need to be removed so that small sticky patches called electrodes can  be placed on your chest. These will transmit information that helps to monitor your heart during the procedure.  An IV will be inserted into one of your veins.  You might be given a medicine to control your heart rate during the procedure. This will help to ensure that good images are obtained.  You will be asked to lie on an exam table. This table will slide in and out of the CT machine during the procedure.  Contrast dye will be injected into the IV. You might feel warm, or you may get a metallic taste in your mouth.  You will be given a medicine called nitroglycerin. This will relax or dilate the arteries in your heart.  The table that you are lying on will move into the CT machine tunnel for the scan.  The person running the machine will give you instructions while the  scans are being done. You may be asked to: ? Keep your arms above your head. ? Hold your breath. ? Stay very still, even if the table is moving.  When the scanning is complete, you will be moved out of the machine.  The IV will be removed. The procedure may vary among health care providers and hospitals. What can I expect after the procedure? After your procedure, it is common to have:  A metallic taste in your mouth from the contrast dye.  A feeling of warmth.  A headache from the nitroglycerin. Follow these instructions at home:  Take over-the-counter and prescription medicines only as told by your health care provider.  If you are told, drink enough fluid to keep your urine pale yellow. This will help to flush the contrast dye out of your body.  Most people can return to their normal activities right after the procedure. Ask your health care provider what activities are safe for you.  It is up to you to get the results of your procedure. Ask your health care provider, or the department that is doing the procedure, when your results will be ready.  Keep all follow-up visits as told by your health care provider. This is important. Contact a health care provider if:  You have any symptoms of allergy to the contrast dye. These include: ? Shortness of breath. ? Rash or hives. ? A racing heartbeat. Summary  A cardiac CT angiogram is a procedure to look at the heart and the area around the heart. It may be done to help find the cause of chest pains or other symptoms of heart disease.  During this procedure, a large X-ray machine, called a CT scanner, takes detailed pictures of the heart and the surrounding area after a contrast dye has been injected into blood vessels in the area.  Ask your health care provider about changing or stopping your regular medicines before the procedure. This is especially important if you are taking diabetes medicines, blood thinners, or medicines to  treat erectile dysfunction.  If you are told, drink enough fluid to keep your urine pale yellow. This will help to flush the contrast dye out of your body. This information is not intended to replace advice given to you by your health care provider. Make sure you discuss any questions you have with your health care provider. Document Revised: 04/17/2019 Document Reviewed: 04/17/2019 Elsevier Patient Education  2020 [place], Kate Sable, MD  06/22/2020 12:58 PM    [place]

## 2020-06-24 NOTE — Telephone Encounter (Signed)
I called and left a HIPAA compliant message on her answering machine to leave a My Chart message with her question.

## 2020-06-30 ENCOUNTER — Other Ambulatory Visit: Payer: Self-pay

## 2020-06-30 ENCOUNTER — Ambulatory Visit: Payer: Medicaid Other | Admitting: Obstetrics & Gynecology

## 2020-07-02 ENCOUNTER — Ambulatory Visit (INDEPENDENT_AMBULATORY_CARE_PROVIDER_SITE_OTHER): Payer: Medicaid Other | Admitting: Nurse Practitioner

## 2020-07-02 ENCOUNTER — Other Ambulatory Visit: Payer: Self-pay

## 2020-07-02 ENCOUNTER — Encounter: Payer: Self-pay | Admitting: Nurse Practitioner

## 2020-07-02 VITALS — BP 110/68 | HR 73 | Temp 98.2°F | Ht 62.0 in | Wt 150.0 lb

## 2020-07-02 DIAGNOSIS — E559 Vitamin D deficiency, unspecified: Secondary | ICD-10-CM

## 2020-07-02 DIAGNOSIS — E519 Thiamine deficiency, unspecified: Secondary | ICD-10-CM

## 2020-07-02 DIAGNOSIS — E538 Deficiency of other specified B group vitamins: Secondary | ICD-10-CM

## 2020-07-02 DIAGNOSIS — F17200 Nicotine dependence, unspecified, uncomplicated: Secondary | ICD-10-CM | POA: Diagnosis not present

## 2020-07-02 DIAGNOSIS — F418 Other specified anxiety disorders: Secondary | ICD-10-CM

## 2020-07-02 DIAGNOSIS — Z6827 Body mass index (BMI) 27.0-27.9, adult: Secondary | ICD-10-CM | POA: Diagnosis not present

## 2020-07-02 DIAGNOSIS — G8929 Other chronic pain: Secondary | ICD-10-CM

## 2020-07-02 DIAGNOSIS — R519 Headache, unspecified: Secondary | ICD-10-CM

## 2020-07-02 DIAGNOSIS — M79604 Pain in right leg: Secondary | ICD-10-CM

## 2020-07-02 HISTORY — DX: Thiamine deficiency, unspecified: E51.9

## 2020-07-02 HISTORY — DX: Vitamin D deficiency, unspecified: E55.9

## 2020-07-02 HISTORY — DX: Deficiency of other specified B group vitamins: E53.8

## 2020-07-02 MED ORDER — FOLIC ACID 400 MCG PO TABS: 400.0000 ug | ORAL_TABLET | Freq: Every day | ORAL | 1 refills | Status: DC

## 2020-07-02 NOTE — Progress Notes (Signed)
Established Patient Office Visit  Subjective:  Patient ID: Heather [name], female    DOB: 10/19/1980  Age: 39 y.o. MRN: [id number]  CC:  Chief Complaint  Patient presents with  . Follow-up    HPI Heather Mcfarland Robinsons a 39 yo who established care 06/05/2020 for anxiety, chest pain,  chronic HA, nicotine dependence, B12 deficiency and low folic acid.     Chronic HA: Patient reports a history of viral meningitis 10 years ago and recalls headache daily for 10 straight days.  Over the last 2 years, she has been getting daily headaches again, described as an ache above her forehead, sharp and sometimes pounding.  The headache is present 18 hours a day for at least 2 years.  Some days it is worse than others.  She has been taking 2-4 packets of Goody powders every day for a couple years and before that she took ibuprofen 800 mg daily.  She was advised last visit to cut back on NSAID use.  We discussed rebound headache.  She did have a CT of the head without contrast performed on 06/22/2020 that was normal.  She was started on amitriptyline 25 mg at bedtime and magnesium oxide 400 mg daily.  She has a neurology appointment 07/28/2020.   Since last visit, she is still taking Goody powders and has not cut back . She has not tried mag oxide . She has found that amitriptyline does not help. Reports a dull HA now 3-4 out of 10.  No aura, photosensitivity, visual disturbance, nausea or vomiting.  She did see an eye doctor for decreased night vision diagnosed with astigmatism. She has eye glasses that are not helping and plans to d/w with her eye doctor.   Chest pain: She had chest pain that is in the middle of the night, attributed to anxiety.  Chest pain after breakfast with left into the emergency room and number work-up with EKG and troponin did not show ischemia.  Since last visit, she saw Dr. Myriam Forehand- [facility] in [facility]iology who performed EKG that demonstrates normal sinus rhythm low voltage QRS.  He advised  smoking cessation, and schedule her for coronary CTA to evaluate for CAD and echocardiogram for shortness of breath to evaluate systolic and diastolic function.  She has had no further chest pain.  She is unable to cut back on smoking.  She smokes to help relieve anxiety.  Anxiety disorder: She recalls having anxiety as a child.  She took medications intermittently through the years and recalls Lexapro and Zoloft did not work. The medicine that worked the best was [employer].  She was referred to [facility] to get established with psychiatry for medication management and had an appointment with them scheduled. She reports her uncle passed away and she had to miss that appointment. PHQ-9: 5, GAD-7: 14. No SI/HI.   Nicotine: Smokes cigarettes 1 pack/day x 20 years.  She was able to quit for 2 and half years, but gained weight so she started smoking again.  Patient does not want to stop smoking because she knows she will gain weight.  She also smokes to relax.  She would like to have her anxiety under control better before she stop smoking.     B12/B1/folic acid deficiency: Patient did have B12 injections weekly x4 and her recent B12 level implored form 124 to 414. She did have a lower folic acid level 4.4 to 3.8  and is on folic  acid  1 mg daily. Her  B1 was low at 6 and she is taking thiamine. Alcohol use is rare.  She is not a vegetarian.  Intrinsic factor was normal, methylmalonic melanic acid was low at 52. Homocysteine was not checked.   Vit D is mildly low at 27. She is taking Vit D 3 1000 IU daily.   BMI 27/overweight:  Patient does not want to stop smoking because she knows she will gain weight. She admits her diet is poor-very picky eater '" I don't eat hardly anything. "  She hates most veggies and eats a high carbohydrate diet. Does not exercise.   Wt Readings from Last 3 Encounters:  07/02/20 150 lb (68 kg)  06/22/20 148 lb (67.1 kg)  06/05/20 149 lb (67.6 kg)    Immunizations:  Declines Covid and flu vaccines. Rationale for the need for these vaccines discussed. She declines. Tdap- UTD Pap Smear: Done 04/11/2019: WNL due in 2023 Mammogram: Done 06/01/2020- normal due in one year.  Dentist: UTD Vision: UTD  Past Medical History:  Diagnosis Date  . Anemia    History of  . Anxiety    stopped meds with pregnancy  . Chicken pox   . Frequent headaches   . Paresthesia of both feet   . Tobacco abuse   . UTI (urinary tract infection)     Past Surgical History:  Procedure Laterality Date  . ANTERIOR AND POSTERIOR REPAIR N/A 08/04/2015   Procedure: ANTERIOR (CYSTOCELE) AND POSTERIOR REPAIR (RECTOCELE);  Surgeon: Allie Bossier, MD;  Location: [facility];  Service: Gynecology;  Laterality: N/A;  . HERNIA REPAIR     when 8 wks old  . LAPAROSCOPIC BILATERAL SALPINGECTOMY Bilateral 08/04/2015   Procedure: LAPAROSCOPIC BILATERAL SALPINGECTOMY;  Surgeon: Allie Bossier, MD;  Location: [facility];  Service: Gynecology;  Laterality: Bilateral;  . WISDOM TOOTH EXTRACTION      Family History  Problem Relation Age of Onset  . Neuropathy Mother   . Breast cancer Mother   . Alcohol abuse Mother   . Drug abuse Mother   . Thyroid disease Father   . Alcohol abuse Father   . Arthritis Father   . Asthma Father   . COPD Father   . Depression Father   . Drug abuse Father   . Early death Father   . Mental illness Father   . Breast cancer Paternal Grandmother   . Autism Son   . ADD / ADHD Son   . Birth defects Neg Hx   . Cancer Neg Hx   . Diabetes Neg Hx   . Hearing loss Neg Hx   . Heart disease Neg Hx   . Hyperlipidemia Neg Hx   . Hypertension Neg Hx   . Kidney disease Neg Hx   . Learning disabilities Neg Hx   . Mental retardation Neg Hx   . Miscarriages / Stillbirths Neg Hx   . Stroke Neg Hx   . Vision loss Neg Hx   . Varicose Veins Neg Hx     Social History   Socioeconomic History  . Marital status: Married    Spouse name: Jill Alexanders  . Number of children: 4  . Years of  education: Not on file  . Highest education level: Not on file  Occupational History  . Not on file  Tobacco Use  . Smoking status: Current Every Day Smoker    Packs/day: 1.00    Years: 20.00    Pack years: 20.00    Types: Cigarettes  . Smokeless tobacco:  Never Used  Vaping Use  . Vaping Use: Never used  Substance and Sexual Activity  . Alcohol use: Yes    Comment: rare  . Drug use: No  . Sexual activity: Yes    Birth control/protection: Surgical  Other Topics Concern  . Not on file  Social History Narrative  . Not on file   Social Determinants of Health   Financial Resource Strain:   . Difficulty of Paying Living Expenses: Not on file  Food Insecurity:   . Worried About Programme researcher, broadcasting/film/videounning Out of Food in the Last Year: Not on file  . Ran Out of Food in the Last Year: Not on file  Transportation Needs:   . Lack of Transportation (Medical): Not on file  . Lack of Transportation (Non-Medical): Not on file  Physical Activity:   . Days of Exercise per Week: Not on file  . Minutes of Exercise per Session: Not on file  Stress:   . Feeling of Stress : Not on file  Social Connections:   . Frequency of Communication with Friends and Family: Not on file  . Frequency of Social Gatherings with Friends and Family: Not on file  . Attends Religious Services: Not on file  . Active Member of Clubs or Organizations: Not on file  . Attends BankerClub or Organization Meetings: Not on file  . Marital Status: Not on file  Intimate Partner Violence:   . Fear of Current or Ex-Partner: Not on file  . Emotionally Abused: Not on file  . Physically Abused: Not on file  . Sexually Abused: Not on file    Outpatient Medications Prior to Visit  Medication Sig Dispense Refill  . amitriptyline (ELAVIL) 25 MG tablet Take 1 tablet (25 mg total) by mouth at bedtime. 30 tablet 1  . Cholecalciferol 25 MCG (1000 UT) tablet Take 1 tablet (1,000 Units total) by mouth daily. 30 tablet 0  . cyanocobalamin (,VITAMIN  B-12,) 1000 MCG/ML injection 1000 mcg (1 mg) injection once per week for four weeks, followed by 1000 mcg injection once per month. 1 mL 3  . thiamine 100 MG tablet Take 1 tablet (100 mg total) by mouth daily. 30 tablet 1  . Aspirin-Acetaminophen (GOODYS BODY PAIN PO) Take by mouth.     . metoprolol tartrate (LOPRESSOR) 100 MG tablet Take 1 tablet (100 mg total) by mouth once for 1 dose. Take 2 hours prior to CT. 1 tablet 0   No facility-administered medications prior to visit.    No Known Allergies  Review of Systems  Constitutional: Negative for chills, fever and unexpected weight change.  HENT: Negative.   Eyes: Positive for visual disturbance. Negative for photophobia and pain.  Respiratory: Negative.   Cardiovascular: Negative for chest pain, palpitations and leg swelling.  Endocrine: Negative.   Genitourinary: Negative.   Musculoskeletal:       Rt leg still bothers her from time to time  and she did not see [facility] walk- in, yet.   Skin: Negative.   Allergic/Immunologic: Negative.   Neurological: Positive for headaches.  Psychiatric/Behavioral:       Positive anxiety See HPI.       Objective:    Physical Exam Vitals reviewed.  Constitutional:      Appearance: Normal appearance.  HENT:     Head: Normocephalic.  Eyes:     Conjunctiva/sclera: Conjunctivae normal.     Pupils: Pupils are equal, round, and reactive to light.  Cardiovascular:     Rate and Rhythm: Normal  rate and regular rhythm.     Pulses: Normal pulses.     Heart sounds: Normal heart sounds.  Pulmonary:     Effort: Pulmonary effort is normal.     Breath sounds: Normal breath sounds.  Abdominal:     Palpations: Abdomen is soft.     Tenderness: There is no abdominal tenderness.  Musculoskeletal:        General: Normal range of motion.     Cervical back: Normal range of motion and neck supple.     Right lower leg: No edema.     Left lower leg: No edema.  Skin:    General: Skin is warm and dry.   Neurological:     General: No focal deficit present.     Mental Status: She is alert and oriented to person, place, and time.  Psychiatric:        Behavior: Behavior normal.        Thought Content: Thought content normal.        Judgment: Judgment normal.     Comments: Mild anxiety.      BP 110/68 (BP Location: Left Arm, Patient Position: Sitting, Cuff Size: Normal)   Pulse 73   Temp 98.2 F (36.8 C) (Oral)   Ht 5\' 2"  (1.575 m)   Wt 150 lb (68 kg)   SpO2 97%   BMI 27.44 kg/m  Wt Readings from Last 3 Encounters:  07/02/20 150 lb (68 kg)  06/22/20 148 lb (67.1 kg)  06/05/20 149 lb (67.6 kg)   Pulse Readings from Last 3 Encounters:  07/02/20 73  06/22/20 72  06/05/20 75    BP Readings from Last 3 Encounters:  07/02/20 110/68  06/22/20 104/62  06/05/20 120/82    Lab Results  Component Value Date   CHOL 131 04/11/2019   HDL 47 04/11/2019   LDLCALC 71 04/11/2019   TRIG 65 04/11/2019   CHOLHDL 2.8 04/11/2019      There are no preventive care reminders to display for this patient.  There are no preventive care reminders to display for this patient.  Lab Results  Component Value Date   TSH 3.51 06/05/2020   Lab Results  Component Value Date   WBC 9.3 06/01/2020   HGB 13.6 06/01/2020   HCT 41.5 06/01/2020   MCV 93.9 06/01/2020   PLT 386 06/01/2020   Lab Results  Component Value Date   NA 137 06/01/2020   K 3.7 06/01/2020   CO2 23 06/01/2020   GLUCOSE 90 06/01/2020   BUN 7 06/01/2020   CREATININE 0.66 06/01/2020   BILITOT 0.3 04/11/2019   ALKPHOS 68 04/11/2019   AST 14 04/11/2019   ALT 11 04/11/2019   PROT 6.8 04/11/2019   ALBUMIN 4.5 04/11/2019   CALCIUM 9.2 06/01/2020   ANIONGAP 8 06/01/2020   Lab Results  Component Value Date   CHOL 131 04/11/2019   Lab Results  Component Value Date   HDL 47 04/11/2019   Lab Results  Component Value Date   LDLCALC 71 04/11/2019   Lab Results  Component Value Date   TRIG 65 04/11/2019   Lab  Results  Component Value Date   CHOLHDL 2.8 04/11/2019   Lab Results  Component Value Date   HGBA1C 5.4 06/05/2020      Assessment & Plan:   Problem List Items Addressed This Visit      Other   Depression with anxiety   Relevant Medications   busPIRone (BUSPAR) 5 MG tablet  Current every day smoker   Chronic nonintractable headache   Right leg pain   Relevant Orders   Ambulatory referral to Orthopedic Surgery   B12 deficiency   Relevant Orders   B12 and Folate Panel   Vitamin D deficiency   Relevant Orders   Vitamin D (25 hydroxy)   Vitamin B1 deficiency   Relevant Orders   Vitamin B1    Other Visit Diagnoses    BMI 27.0-27.9,adult    -  Primary   Relevant Orders   Amb ref to Medical Nutrition Therapy-MNT      Meds ordered this encounter  Medications  . folic acid (V-R FOLIC ACID) 400 MCG tablet    Sig: Take 1 tablet (400 mcg total) by mouth daily.    Dispense:  30 tablet    Refill:  1    Order Specific Question:   Supervising Provider    Answer:   Dale McLean [id number]  . busPIRone (BUSPAR) 5 MG tablet    Sig: Take 1 tablet (5 mg total) by mouth 2 (two) times daily.    Dispense:  60 tablet    Refill:  1    Order Specific Question:   Supervising Provider    Answer:   Dale [place] [[id number]]   Please address anxiety with  make another appointment with the psychiatry.   She was already given an appt for [facility]: [phone]. Begin Buspar 5 mg BID.   Try Magnesium oxide 400 mg daily for daily headaches.  Take the Elavil 25 mg at bedtime regularly. Cut back and stop the Goody's- it is possible to cause  rebound headache- and taking ibuprofen or Goody's or Aleve or Tylenol  daily can do this.  Your head CT was normal. Keep Neurology appt as arranged.   Continue with  folic acid, vit D, B1 as directed. Stop the B12 injections- your B12 level is well within normal now. Stay on the B12 pills 1000 mcg daily. We can check levels  in 3 months.  You  report a diet that is low in variety and veggies and weight loss goals- referral in to a Registered Dietician to help meet your goals.   She mentions right leg pain sx when receiving her AVS on discharge: and it sometimes feels like it will give out. She was advised to make appt or got to walk - in with [facility] - she has the information from last visit.    I provided  35 minutes of  time during this encounter reviewing patient's current problems and past surgeries, labs and imaging studies, providing counseling on the above mentioned problems , and coordination  of care .  Follow-up: Return in about 3 months (around 10/02/2020).   This visit occurred during the SARS-CoV-2 public health emergency.  Safety protocols were in place, including screening questions prior to the visit, additional usage of staff PPE, and extensive cleaning of exam room while observing appropriate contact time as indicated for disinfecting solutions.   Amedeo Kinsman, NP

## 2020-07-02 NOTE — Patient Instructions (Addendum)
Please address anxiety with  make an appointment with the psychiatry and here are 3 places to call.    [facility] :  [phone]  [facility]:  [phone]  [facility]: [phone]  Try Magnesium oxide 400 mg daily for daily headaches.  Take the Elavil 25 mg at bedtime regularly. Cut back and stop the Goody's- it is possible to cause  rebound headache- and taking ibuprofen or Goody's or Aleve or Tylenol  daily can do this.  Your head CT was normal. Keep Neurology appt as arranged.   Continue with  folic acid, vit D, B1 as directed. Stop the B12 injections- your B12 level is well within normal now. Stay on the B12 pills 1000 mcg daily. We can check levels  in 3 months.  You report a diet that is low in variety and veggies and weight loss goals- referral in to a Registered Dietician to help meet your goals.    Office visit in 3 months for follow up.     General Headache Without Cause A headache is pain or discomfort felt around the head or neck area. The specific cause of a headache may not be found. There are many causes and types of headaches. A few common ones are:  Tension headaches.  Migraine headaches.  Cluster headaches.  Chronic daily headaches. Follow these instructions at home: Watch your condition for any changes. Let your health care provider know about them. Take these steps to help with your condition: Managing pain      Take over-the-counter and prescription medicines only as told by your health care provider.  Lie down in a dark, quiet room when you have a headache.  If directed, put ice on your head and neck area: ? Put ice in a plastic bag. ? Place a towel between your skin and the bag. ? Leave the ice on for 20 minutes, 2-3 times per day.  If directed, apply heat to the affected area. Use the heat source that your health care provider recommends, such as a moist heat pack or a heating pad. ? Place a towel between your skin and the heat source. ? Leave  the heat on for 20-30 minutes. ? Remove the heat if your skin turns bright red. This is especially important if you are unable to feel pain, heat, or cold. You may have a greater risk of getting burned.  Keep lights dim if bright lights bother you or make your headaches worse. Eating and drinking  Eat meals on a regular schedule.  If you drink alcohol: ? Limit how much you use to:  0-1 drink a day for women.  0-2 drinks a day for men. ? Be aware of how much alcohol is in your drink. In the [place] one drink equals one 12 oz bottle of beer (355 mL), one 5 oz glass of wine (148 mL), or one 1 oz glass of hard liquor (44 mL).  Stop drinking caffeine, or decrease the amount of caffeine you drink. General instructions   Keep a headache journal to help find out what may trigger your headaches. For example, write down: ? What you eat and drink. ? How much sleep you get. ? Any change to your diet or medicines.  Try massage or other relaxation techniques.  Limit stress.  Sit up straight, and do not tense your muscles.  Do not use any products that contain nicotine or tobacco, such as cigarettes, e-cigarettes, and chewing tobacco. If you need help quitting, ask your  health care provider.  Exercise regularly as told by your health care provider.  Sleep on a regular schedule. Get 7-9 hours of sleep each night, or the amount recommended by your health care provider.  Keep all follow-up visits as told by your health care provider. This is important. Contact a health care provider if:  Your symptoms are not helped by medicine.  You have a headache that is different from the usual headache.  You have nausea or you vomit.  You have a fever. Get help right away if:  Your headache becomes severe quickly.  Your headache gets worse after moderate to intense physical activity.  You have repeated vomiting.  You have a stiff neck.  You have a loss of vision.  You have problems with  speech.  You have pain in the eye or ear.  You have muscular weakness or loss of muscle control.  You lose your balance or have trouble walking.  You feel faint or pass out.  You have confusion.  You have a seizure. Summary  A headache is pain or discomfort felt around the head or neck area.  There are many causes and types of headaches. In some cases, the cause may not be found.  Keep a headache journal to help find out what may trigger your headaches. Watch your condition for any changes. Let your health care provider know about them.  Contact a health care provider if you have a headache that is different from the usual headache, or if your symptoms are not helped by medicine.  Get help right away if your headache becomes severe, you vomit, you have a loss of vision, you lose your balance, or you have a seizure. This information is not intended to replace advice given to you by your health care provider. Make sure you discuss any questions you have with your health care provider. Document Revised: 03/12/2018 Document Reviewed: 03/12/2018 Elsevier Patient Education  2020 [employer] Form - Headache Record There are many types and causes of headaches. A headache record can help guide your treatment plan. Use this form to record the details. Bring this form with you to your follow-up visits. Follow your health care provider's instructions on how to describe your headache. You may be asked to:  Use a pain scale. This is a tool to rate the intensity of your headache using words or numbers.  Describe what your headache feels like, such as dull, achy, throbbing, or sharp. Headache record Date: _______________ Time (from start to end): ____________________ Location of the headache: _________________________  Intensity of the headache: ____________________ Description of the headache: ______________________________________________________________  Hours of sleep the night before  the headache: __________  Food or drinks before the headache started: ______________________________________________________________________________________  Events before the headache started: _______________________________________________________________________________________________  Symptoms before the headache started: __________________________________________________________________________________________  Symptoms during the headache: __________________________________________________________________________________________________  Treatment: ________________________________________________________________________________________________________________  Effect of treatment: _________________________________________________________________________________________________________  Other comments: ___________________________________________________________________________________________________________ Date: _______________ Time (from start to end): ____________________ Location of the headache: _________________________  Intensity of the headache: ____________________ Description of the headache: ______________________________________________________________  Hours of sleep the night before the headache: __________  Food or drinks before the headache started: ______________________________________________________________________________________  Events before the headache started: ____________________________________________________________________________________________  Symptoms before the headache started: _________________________________________________________________________________________  Symptoms during the headache: _______________________________________________________________________________________________  Treatment: ________________________________________________________________________________________________________________  Effect of treatment:  _________________________________________________________________________________________________________  Other comments: ___________________________________________________________________________________________________________ Date: _______________ Time (from start to end): ____________________ Location of the headache: _________________________  Intensity of the headache: ____________________ Description of the  headache: ______________________________________________________________  Hours of sleep the night before the headache: __________  Food or drinks before the headache started: ______________________________________________________________________________________  Events before the headache started: ____________________________________________________________________________________________  Symptoms before the headache started: _________________________________________________________________________________________  Symptoms during the headache: _______________________________________________________________________________________________  Treatment: ________________________________________________________________________________________________________________  Effect of treatment: _________________________________________________________________________________________________________  Other comments: ___________________________________________________________________________________________________________ Date: _______________ Time (from start to end): ____________________ Location of the headache: _________________________  Intensity of the headache: ____________________ Description of the headache: ______________________________________________________________  Hours of sleep the night before the headache: _________  Food or drinks before the headache started: ______________________________________________________________________________________  Events before the  headache started: ____________________________________________________________________________________________  Symptoms before the headache started: _________________________________________________________________________________________  Symptoms during the headache: _______________________________________________________________________________________________  Treatment: ________________________________________________________________________________________________________________  Effect of treatment: _________________________________________________________________________________________________________  Other comments: ___________________________________________________________________________________________________________ Date: _______________ Time (from start to end): ____________________ Location of the headache: _________________________  Intensity of the headache: ____________________ Description of the headache: ______________________________________________________________  Hours of sleep the night before the headache: _________  Food or drinks before the headache started: ______________________________________________________________________________________  Events before the headache started: ____________________________________________________________________________________________  Symptoms before the headache started: _________________________________________________________________________________________  Symptoms during the headache: _______________________________________________________________________________________________  Treatment: ________________________________________________________________________________________________________________  Effect of treatment: _________________________________________________________________________________________________________  Other comments:  ___________________________________________________________________________________________________________ This information is not intended to replace advice given to you by your health care provider. Make sure you discuss any questions you have with your health care provider. Document Revised: 09/10/2018 Document Reviewed: 09/10/2018 Elsevier Patient Education  2020 [employer] Inc[employer]  Managing Anxiety, Adult After being diagnosed with an anxiety disorder, you may be relieved to know why you have felt or behaved a certain way. You may also feel overwhelmed about the treatment ahead and what it will mean for your life. With care and support, you can manage this condition and recover from it. How to manage lifestyle changes Managing stress and anxiety  Stress is your body's reaction to life changes and events, both good and bad. Most stress will last just a few hours, but stress can be ongoing and can lead to more than just stress. Although stress can play a major role in anxiety, it is not the same as anxiety. Stress is usually caused by something external, such as a deadline, test, or competition. Stress normally passes after the triggering event has ended.  Anxiety is caused by something internal, such as imagining a terrible outcome or worrying that something will go wrong that will devastate you. Anxiety often does not go away even after the triggering event is over, and it can become long-term (chronic) worry. It is important to understand the differences between stress and anxiety and to manage your stress effectively so that it does not lead to an anxious response. Talk with your health care provider or a counselor to learn more about reducing anxiety and stress. He or she may suggest tension reduction techniques, such as:  Music therapy. This can include creating or listening to music that you enjoy and that inspires you.  Mindfulness-based meditation. This involves being aware of your normal  breaths while not trying to control your breathing. It can be done while sitting or walking.  Centering prayer. This involves focusing on a word, phrase, or sacred image that means something to you and brings you peace.  Deep breathing. To do this, expand your stomach and inhale slowly through your nose. Hold your breath for 3-5 seconds. Then exhale slowly, letting your stomach muscles relax.  Self-talk. This involves identifying thought patterns that lead to anxiety reactions and changing those patterns.  Muscle relaxation.  This involves tensing muscles and then relaxing them. Choose a tension reduction technique that suits your lifestyle and personality. These techniques take time and practice. Set aside 5-15 minutes a day to do them. Therapists can offer counseling and training in these techniques. The training to help with anxiety may be covered by some insurance plans. Other things you can do to manage stress and anxiety include:  Keeping a stress/anxiety diary. This can help you learn what triggers your reaction and then learn ways to manage your response.  Thinking about how you react to certain situations. You may not be able to control everything, but you can control your response.  Making time for activities that help you relax and not feeling guilty about spending your time in this way.  Visual imagery and yoga can help you stay calm and relax.  Medicines Medicines can help ease symptoms. Medicines for anxiety include:  Anti-anxiety drugs.  Antidepressants. Medicines are often used as a primary treatment for anxiety disorder. Medicines will be prescribed by a health care provider. When used together, medicines, psychotherapy, and tension reduction techniques may be the most effective treatment. Relationships Relationships can play a big part in helping you recover. Try to spend more time connecting with trusted friends and family members. Consider going to couples counseling,  taking family education classes, or going to family therapy. Therapy can help you and others better understand your condition. How to recognize changes in your anxiety Everyone responds differently to treatment for anxiety. Recovery from anxiety happens when symptoms decrease and stop interfering with your daily activities at home or work. This may mean that you will start to:  Have better concentration and focus. Worry will interfere less in your daily thinking.  Sleep better.  Be less irritable.  Have more energy.  Have improved memory. It is important to recognize when your condition is getting worse. Contact your health care provider if your symptoms interfere with home or work and you feel like your condition is not improving. Follow these instructions at home: Activity  Exercise. Most adults should do the following: ? Exercise for at least 150 minutes each week. The exercise should increase your heart rate and make you sweat (moderate-intensity exercise). ? Strengthening exercises at least twice a week.  Get the right amount and quality of sleep. Most adults need 7-9 hours of sleep each night. Lifestyle   Eat a healthy diet that includes plenty of vegetables, fruits, whole grains, low-fat dairy products, and lean protein. Do not eat a lot of foods that are high in solid fats, added sugars, or salt.  Make choices that simplify your life.  Do not use any products that contain nicotine or tobacco, such as cigarettes, e-cigarettes, and chewing tobacco. If you need help quitting, ask your health care provider.  Avoid caffeine, alcohol, and certain over-the-counter cold medicines. These may make you feel worse. Ask your pharmacist which medicines to avoid. General instructions  Take over-the-counter and prescription medicines only as told by your health care provider.  Keep all follow-up visits as told by your health care provider. This is important. Where to find support You  can get help and support from these sources:  Self-help groups.  Online and [employer].  A trusted spiritual leader.  Couples counseling.  Family education classes.  Family therapy. Where to find more information You may find that joining a support group helps you deal with your anxiety. The following sources can help you locate counselors or support groups  near you:  Mental Health [place]: www.mentalhealthamerica.net  Anxiety and Depression Association of [place] (ADAA): ProgramCam.de  [employer] Mental Illness (NAMI): www.nami.org Contact a health care provider if you:  Have a hard time staying focused or finishing daily tasks.  Spend many hours a day feeling worried about everyday life.  Become exhausted by worry.  Start to have headaches, feel tense, or have nausea.  Urinate more than normal.  Have diarrhea. Get help right away if you have:  A racing heart and shortness of breath.  Thoughts of hurting yourself or others. If you ever feel like you may hurt yourself or others, or have thoughts about taking your own life, get help right away. You can go to your nearest emergency department or call:  Your local emergency services (911 in the [place]  A suicide crisis helpline, such as the National Suicide Prevention Lifeline at 984 051 0610. This is open 24 hours a day. Summary  Taking steps to learn and use tension reduction techniques can help calm you and help prevent triggering an anxiety reaction.  When used together, medicines, psychotherapy, and tension reduction techniques may be the most effective treatment.  Family, friends, and partners can play a big part in helping you recover from an anxiety disorder. This information is not intended to replace advice given to you by your health care provider. Make sure you discuss any questions you have with your health care provider. Document Revised: 01/22/2019 Document Reviewed:  01/22/2019 Elsevier Patient Education  2020 [employer] Inc.  Preventing Vitamin D Deficiency Vitamin D is a nutrient that helps your body absorb calcium from food. It plays a key role in the health of bones and teeth, muscle function, and infection prevention. Our bodies make vitamin D when our skin is exposed to direct sunlight. However, for many people, this may not be enough vitamin D to meet the body's needs. When you get too little vitamin D, it is called a deficiency. How can this condition affect me? A vitamin D deficiency can put you at risk of developing conditions that cause bones to be brittle, such as rickets or osteoporosis. If you are over age 31, not having enough vitamin D may weaken your muscles and bones and increase your risk for falls and broken bones. What can increase my risk? You may be at risk for a vitamin D deficiency if you:  Are pregnant.  Are obese.  Are over 36 years old.  Have dark skin.  Take certain medicines that affect the way vitamin D is absorbed.  Have had gastric bypass surgery. Other risk factors include:  Having a condition that limits your ability to absorb fat, such as cystic fibrosis, celiac disease, or inflammatory bowel disease.  Having certain inherited conditions.  Not having access to foods rich in vitamin D.  Having limited ability to move.  Living in areas that have fewer hours of sunlight.  Spending most of your day indoors, or you cover your skin all the time when you are outdoors. Breastfed infants are also at risk for vitamin D deficiency. What actions can I take to reduce my risk of a vitamin D deficiency? Knowing the best sources of vitamin D You can meet your daily vitamin D needs from:  Foods.  Dietary supplements.  Direct exposure to natural sunlight.  Infant formula (for babies). Knowing how much vitamin D you need General recommendations for daily vitamin D intake vary by these categories:  Infants: 400  International Units.  Children over  14 year old: 600 International Units.  Adults: 600 International Units.  Pregnant and breastfeeding women: 600 International Units.  Adults over 72 years old: 800 International Units. These are minimum levels of recommended amounts. Your health care provider may recommend a different amount of vitamin D intake based on your specific needs and your overall health. Getting sun exposure  Get regular, safe exposure to natural sunlight. Expose your skin to direct sunlight for at least 15 minutes every day. If you have dark skin, you may need to expose your skin for a longer period of time.  Protect your skin from too much sun exposure. This helps to prevent skin cancer.  Ask your health care provider if regular sun exposure is safe for you.  Do not use a tanning bed. Eating and drinking   Eat foods that naturally contain vitamin D. These include: ? Beef liver. ? Egg yolk. ? Fatty fish, such as cod, salmon, trout, swordfish, shrimp, sardines, and tuna. ? Cheese. ? Mushrooms. ? Oysters.  Eat or drink products that have been fortified with vitamin D. Fortified means that vitamin D has been added to the food. These may include: ? Cereals. ? Dairy products, such as milk, yogurt, butter, or margarine. ? Orange juice. ? Alternative milks, such as soy milk or almond milk.  When choosing foods, check the food label on the package to see: ? How much vitamin D is in the item. ? If the food is fortified with vitamin D.  Although it is hard to get your vitamin D requirement from foods alone, you should eat a balanced diet each day that includes foods naturally higher in vitamin D or fortified with it. Try to include the following in your diet each day: ? 2-3 servings of meat or meat alternatives. ? 2-3 servings of dairy. Taking supplements If you are at risk for vitamin D deficiency, or if you have certain diseases, your health care provider may recommend  that you take a vitamin D supplement. Make sure you:  Talk with your health care provider before you start taking any vitamin D supplements. You may be more sensitive to the side effects of vitamin D supplements if you are on certain medicines or have certain medical conditions.  Tell your health care provider about all medicines you are taking, including vitamin, mineral, and herbal supplements.  Take medicines and supplements only as told by your health care provider. Summary  Vitamin D is a nutrient that helps your body absorb calcium from food.  A vitamin D deficiency can put you at risk of developing conditions that cause bones to be brittle, such as rickets or osteoporosis.  Our bodies make vitamin D when our skin is exposed to direct sunlight. However, for many people, this may not be enough vitamin D to meet the body's needs.  Some foods naturally contain vitamin D, including beef liver, egg yolk, and fatty fish.  Products may also be fortified with vitamin D. Fortified means that vitamin D has been added to the food. This information is not intended to replace advice given to you by your health care provider. Make sure you discuss any questions you have with your health care provider. Document Revised: 05/15/2019 Document Reviewed: 08/17/2018 Elsevier Patient Education  2020 Elsevier Inc.  Vitamin B12 Deficiency Vitamin B12 deficiency occurs when the body does not have enough vitamin B12, which is an important vitamin. The body needs this vitamin:  To make red blood cells.  To make DNA.  This is the genetic material inside cells.  To help the nerves work properly so they can carry messages from the brain to the body. Vitamin B12 deficiency can cause various health problems, such as a low red blood cell count (anemia) or nerve damage. What are the causes? This condition may be caused by:  Not eating enough foods that contain vitamin B12.  Not having enough stomach acid and  digestive fluids to properly absorb vitamin B12 from the food that you eat.  Certain digestive system diseases that make it hard to absorb vitamin B12. These diseases include Crohn's disease, chronic pancreatitis, and cystic fibrosis.  A condition in which the body does not make enough of a protein (intrinsic factor), resulting in too few red blood cells (pernicious anemia).  Having a surgery in which part of the stomach or small intestine is removed.  Taking certain medicines that make it hard for the body to absorb vitamin B12. These medicines include: ? Heartburn medicines (antacids and proton pump inhibitors). ? Certain antibiotic medicines. ? Some medicines that are used to treat diabetes, tuberculosis, gout, or high cholesterol. What increases the risk? The following factors may make you more likely to develop a B12 deficiency:  Being older than age 9.  Eating a vegetarian or vegan diet, especially while you are pregnant.  Eating a poor diet while you are pregnant.  Taking certain medicines.  Having alcoholism. What are the signs or symptoms? In some cases, there are no symptoms of this condition. If the condition leads to anemia or nerve damage, various symptoms can occur, such as:  Weakness.  Fatigue.  Loss of appetite.  Weight loss.  Numbness or tingling in your hands and feet.  Redness and burning of the tongue.  Confusion or memory problems.  Depression.  Sensory problems, such as color blindness, ringing in the ears, or loss of taste.  Diarrhea or constipation.  Trouble walking. If anemia is severe, symptoms can include:  Shortness of breath.  Dizziness.  Rapid heart rate (tachycardia). How is this diagnosed? This condition may be diagnosed with a blood test to measure the level of vitamin B12 in your blood. You may also have other tests, including:  A group of tests that measure certain characteristics of blood cells (complete blood count,  CBC).  A blood test to measure intrinsic factor.  A procedure where a thin tube with a camera on the end is used to look into your stomach or intestines (endoscopy). Other tests may be needed to discover the cause of B12 deficiency. How is this treated? Treatment for this condition depends on the cause. This condition may be treated by:  Changing your eating and drinking habits, such as: ? Eating more foods that contain vitamin B12. ? Drinking less alcohol or no alcohol.  Getting vitamin B12 injections.  Taking vitamin B12 supplements. Your health care provider will tell you which dosage is best for you. Follow these instructions at home: Eating and drinking   Eat lots of healthy foods that contain vitamin B12, including: ? Meats and poultry. This includes beef, pork, chicken, [place], and organ meats, such as liver. ? Seafood. This includes clams, rainbow trout, salmon, tuna, and haddock. ? Eggs. ? Cereal and dairy products that are fortified. This means that vitamin B12 has been added to the food. Check the label on the package to see if the food is fortified. The items listed above may not be a complete list of recommended foods and beverages. Contact  a dietitian for more information. General instructions  Get any injections that are prescribed by your health care provider.  Take supplements only as told by your health care provider. Follow the directions carefully.  Do not drink alcohol if your health care provider tells you not to. In some cases, you may only be asked to limit alcohol use.  Keep all follow-up visits as told by your health care provider. This is important. Contact a health care provider if:  Your symptoms come back. Get help right away if you:  Develop shortness of breath.  Have a rapid heart rate.  Have chest pain.  Become dizzy or lose consciousness. Summary  Vitamin B12 deficiency occurs when the body does not have enough vitamin B12.  The  main causes of vitamin B12 deficiency include dietary deficiency, digestive diseases, pernicious anemia, and having a surgery in which part of the stomach or small intestine is removed.  In some cases, there are no symptoms of this condition. If the condition leads to anemia or nerve damage, various symptoms can occur, such as weakness, shortness of breath, and numbness.  Treatment may include getting vitamin B12 injections or taking vitamin B12 supplements. Eat lots of healthy foods that contain vitamin B12. This information is not intended to replace advice given to you by your health care provider. Make sure you discuss any questions you have with your health care provider. Document Revised: 02/08/2019 Document Reviewed: 05/01/2018 Elsevier Patient Education  2020 [employer].

## 2020-07-03 MED ORDER — BUSPIRONE HCL 5 MG PO TABS: 5.0000 mg | ORAL_TABLET | Freq: Two times a day (BID) | ORAL | 1 refills | Status: DC

## 2020-07-06 DIAGNOSIS — H5213 Myopia, bilateral: Secondary | ICD-10-CM | POA: Diagnosis not present

## 2020-07-07 NOTE — Telephone Encounter (Signed)
Amedeo Kinsman NP is PCP and pt has seen PCP.

## 2020-07-13 DIAGNOSIS — M5416 Radiculopathy, lumbar region: Secondary | ICD-10-CM | POA: Diagnosis not present

## 2020-07-13 HISTORY — DX: Radiculopathy, lumbar region: M54.16

## 2020-07-14 ENCOUNTER — Telehealth: Payer: Self-pay | Admitting: Nurse Practitioner

## 2020-07-14 ENCOUNTER — Other Ambulatory Visit: Payer: Self-pay

## 2020-07-14 ENCOUNTER — Ambulatory Visit (INDEPENDENT_AMBULATORY_CARE_PROVIDER_SITE_OTHER): Payer: Medicaid Other

## 2020-07-14 DIAGNOSIS — R079 Chest pain, unspecified: Secondary | ICD-10-CM

## 2020-07-14 DIAGNOSIS — K8020 Calculus of gallbladder without cholecystitis without obstruction: Secondary | ICD-10-CM

## 2020-07-14 DIAGNOSIS — R0602 Shortness of breath: Secondary | ICD-10-CM

## 2020-07-14 DIAGNOSIS — Z711 Person with feared health complaint in whom no diagnosis is made: Secondary | ICD-10-CM

## 2020-07-14 NOTE — Telephone Encounter (Signed)
I placed order for abd [place]. Please let her know.

## 2020-07-14 NOTE — Telephone Encounter (Signed)
Patient states that she did have her echo done today. The report is not in yet; the tech that did the echo looks at womens gallbladders when she does these when there is pain involved to check for stone. States the doctor won't look at that but she was able to see them. She would like to have the US done asap if possible and then will need a referral for a surgeon to have them removed.

## 2020-07-14 NOTE — Telephone Encounter (Signed)
Patient called stated that the ultrasonic was done and the reason for her chest pain was that she has gall stones and her gall bladder need to be removed and she was advised to contact her pcp for an official test and for a surgeron

## 2020-07-14 NOTE — Telephone Encounter (Signed)
She had her cardiac echo today. Did they see gallstones? I do not see the report. If yes, then I can order abd [place] stat for tomorrow to verify.

## 2020-07-15 ENCOUNTER — Other Ambulatory Visit: Payer: Self-pay

## 2020-07-15 ENCOUNTER — Ambulatory Visit
Admission: RE | Admit: 2020-07-15 | Discharge: 2020-07-15 | Disposition: A | Discharge status: Home / Self Care | Payer: Medicaid Other | Source: Ambulatory Visit | Attending: Nurse Practitioner | Admitting: Nurse Practitioner

## 2020-07-15 DIAGNOSIS — R079 Chest pain, unspecified: Secondary | ICD-10-CM

## 2020-07-15 DIAGNOSIS — K8020 Calculus of gallbladder without cholecystitis without obstruction: Secondary | ICD-10-CM | POA: Diagnosis not present

## 2020-07-15 DIAGNOSIS — Z711 Person with feared health complaint in whom no diagnosis is made: Secondary | ICD-10-CM | POA: Diagnosis not present

## 2020-07-15 LAB — ECHOCARDIOGRAM COMPLETE
AR max vel: 2.43 cm2
AV Mean grad: 2.5 mmHg
AV Peak grad: 4.6 mmHg
Ao pk vel: 1.07 m/s
S' Lateral: 3.1 cm

## 2020-07-15 NOTE — Telephone Encounter (Signed)
Good morning!  Pt is scheduled for today.

## 2020-07-21 DIAGNOSIS — Z20822 Contact with and (suspected) exposure to covid-19: Secondary | ICD-10-CM | POA: Diagnosis not present

## 2020-07-27 NOTE — Progress Notes (Deleted)
NEUROLOGY CONSULTATION NOTE  Heather [name] MRN: [id number] DOB: July 25, 1981  Referring provider: Amedeo Kinsman, NP Primary care provider: Amedeo Kinsman, NP  Reason for consult:  headache   Subjective:  Heather [name] is a 39 year old ***-handed female who presents for headaches.  History supplemented by referring provider's notes.  Onset:  ***.  Constant for 2 years. Location:  *** Quality:  Sharp, pounding Intensity:  ***.  *** denies new headache, thunderclap headache or severe headache that wakes *** from sleep. Aura:  *** Prodrome:  *** Postdrome:  *** Associated symptoms:  ***.  *** denies associated unilateral numbness or weakness. Duration:  persistent Frequency:  Persistent/daily Frequency of abortive medication: *** Triggers:  *** Relieving factors:  *** Activity:  ***  She had viral meningitis in her late 21s.    CT head without contrast on 06/22/2020 personally reviewed was normal.  Current NSAIDS/analgesics:  Goody's 2 to 4 packs, ibuprofen 800mg  Current triptans:  none Current ergotamine:  none Current anti-emetic:  none Current muscle relaxants:  none Current Antihypertensive medications:  none Current Antidepressant medications:  Amitriptyline 25mg  at bedtime Current Anticonvulsant medications:  none Current anti-CGRP:  none Current Vitamins/Herbal/Supplements:  B12, folic acid, thiamine, D Current Antihistamines/Decongestants:  none Other therapy:  *** Hormone/birth control:  none Other medications:  buspirone  Past NSAIDS/analgesics:  Tylenol Past abortive triptans:  *** Past abortive ergotamine:  *** Past muscle relaxants:  Flexeril Past anti-emetic:  *** Past antihypertensive medications:  HCTZ Past antidepressant medications:  *** Past anticonvulsant medications:  *** Past anti-CGRP:  *** Past vitamins/Herbal/Supplements:  *** Past antihistamines/decongestants:  *** Other past therapies:  ***  Caffeine:  *** Alcohol:   *** Smoker:  *** Diet:  *** Exercise:  *** Depression:  ***; Anxiety:  *** Other pain:  *** Sleep hygiene:  *** Family history of headache:  ***      PAST MEDICAL HISTORY: Past Medical History:  Diagnosis Date  . Anemia    History of  . Anxiety    stopped meds with pregnancy  . Chicken pox   . Frequent headaches   . Paresthesia of both feet   . Tobacco abuse   . UTI (urinary tract infection)     PAST SURGICAL HISTORY: Past Surgical History:  Procedure Laterality Date  . ANTERIOR AND POSTERIOR REPAIR N/A 08/04/2015   Procedure: ANTERIOR (CYSTOCELE) AND POSTERIOR REPAIR (RECTOCELE);  Surgeon: , MD;  Location: [facility];  Service: Gynecology;  Laterality: N/A;  . HERNIA REPAIR     when 8 wks old  . LAPAROSCOPIC BILATERAL SALPINGECTOMY Bilateral 08/04/2015   Procedure: LAPAROSCOPIC BILATERAL SALPINGECTOMY;  Surgeon: Allie Bossier, MD;  Location: [facility];  Service: Gynecology;  Laterality: Bilateral;  . WISDOM TOOTH EXTRACTION      MEDICATIONS: Current Outpatient Medications on File Prior to Visit  Medication Sig Dispense Refill  . amitriptyline (ELAVIL) 25 MG tablet Take 1 tablet (25 mg total) by mouth at bedtime. 30 tablet 1  . busPIRone (BUSPAR) 5 MG tablet Take 1 tablet (5 mg total) by mouth 2 (two) times daily. 60 tablet 1  . Cholecalciferol 25 MCG (1000 UT) tablet Take 1 tablet (1,000 Units total) by mouth daily. 30 tablet 0  . cyanocobalamin (,VITAMIN B-12,) 1000 MCG/ML injection 1000 mcg (1 mg) injection once per week for four weeks, followed by 1000 mcg injection once per month. 1 mL 3  . folic acid (V-R FOLIC ACID) 400 MCG tablet Take 1 tablet (400 mcg total)  by mouth daily. 30 tablet 1  . thiamine 100 MG tablet Take 1 tablet (100 mg total) by mouth daily. 30 tablet 1   No current facility-administered medications on file prior to visit.    ALLERGIES: No Known Allergies  FAMILY HISTORY: Family History  Problem Relation Age of Onset  . Neuropathy  Mother   . Breast cancer Mother   . Alcohol abuse Mother   . Drug abuse Mother   . Thyroid disease Father   . Alcohol abuse Father   . Arthritis Father   . Asthma Father   . COPD Father   . Depression Father   . Drug abuse Father   . Early death Father   . Mental illness Father   . Breast cancer Paternal Grandmother   . Autism Son   . ADD / ADHD Son   . Birth defects Neg Hx   . Cancer Neg Hx   . Diabetes Neg Hx   . Hearing loss Neg Hx   . Heart disease Neg Hx   . Hyperlipidemia Neg Hx   . Hypertension Neg Hx   . Kidney disease Neg Hx   . Learning disabilities Neg Hx   . Mental retardation Neg Hx   . Miscarriages / Stillbirths Neg Hx   . Stroke Neg Hx   . Vision loss Neg Hx   . Varicose Veins Neg Hx    ***.  SOCIAL HISTORY: Social History   Socioeconomic History  . Marital status: Married    Spouse name: Jill Alexanders  . Number of children: 4  . Years of education: Not on file  . Highest education level: Not on file  Occupational History  . Not on file  Tobacco Use  . Smoking status: Current Every Day Smoker    Packs/day: 1.00    Years: 20.00    Pack years: 20.00    Types: Cigarettes  . Smokeless tobacco: Never Used  Vaping Use  . Vaping Use: Never used  Substance and Sexual Activity  . Alcohol use: Yes    Comment: rare  . Drug use: No  . Sexual activity: Yes    Birth control/protection: Surgical  Other Topics Concern  . Not on file  Social History Narrative  . Not on file   Social Determinants of Health   Financial Resource Strain:   . Difficulty of Paying Living Expenses: Not on file  Food Insecurity:   . Worried About Programme researcher, broadcasting/film/video in the Last Year: Not on file  . Ran Out of Food in the Last Year: Not on file  Transportation Needs:   . Lack of Transportation (Medical): Not on file  . Lack of Transportation (Non-Medical): Not on file  Physical Activity:   . Days of Exercise per Week: Not on file  . Minutes of Exercise per Session: Not on  file  Stress:   . Feeling of Stress : Not on file  Social Connections:   . Frequency of Communication with Friends and Family: Not on file  . Frequency of Social Gatherings with Friends and Family: Not on file  . Attends Religious Services: Not on file  . Active Member of Clubs or Organizations: Not on file  . Attends Banker Meetings: Not on file  . Marital Status: Not on file  Intimate Partner Violence:   . Fear of Current or Ex-Partner: Not on file  . Emotionally Abused: Not on file  . Physically Abused: Not on file  . Sexually Abused:  Not on file    Objective:  *** General: No acute distress.  Patient appears well-groomed.   Head:  Normocephalic/atraumatic Eyes:  fundi examined but not visualized Neck: supple, no paraspinal tenderness, full range of motion Back: No paraspinal tenderness Heart: regular rate and rhythm Lungs: Clear to auscultation bilaterally. Vascular: No carotid bruits. Neurological Exam: Mental status: alert and oriented to person, place, and time, recent and remote memory intact, fund of knowledge intact, attention and concentration intact, speech fluent and not dysarthric, language intact. Cranial nerves: CN I: not tested CN II: pupils equal, round and reactive to light, visual fields intact CN III, IV, VI:  full range of motion, no nystagmus, no ptosis CN V: facial sensation intact. CN VII: upper and lower face symmetric CN VIII: hearing intact CN IX, X: gag intact, uvula midline CN XI: sternocleidomastoid and trapezius muscles intact CN XII: tongue midline Bulk & Tone: normal, no fasciculations. Motor:  muscle strength 5/5 throughout Sensation:  Pinprick, temperature and vibratory sensation intact. Deep Tendon Reflexes:  2+ throughout,  toes downgoing.   Finger to nose testing:  Without dysmetria.   Heel to shin:  Without dysmetria.   Gait:  Normal station and stride.  Romberg negative.  Assessment/Plan:   ***    Thank you  for allowing me to take part in the care of this patient.  Shon Millet, DO  CC: ***

## 2020-07-28 ENCOUNTER — Telehealth: Payer: Self-pay | Admitting: Nurse Practitioner

## 2020-07-28 ENCOUNTER — Ambulatory Visit: Payer: Medicaid Other | Admitting: Neurology

## 2020-07-28 DIAGNOSIS — F419 Anxiety disorder, unspecified: Secondary | ICD-10-CM

## 2020-07-28 NOTE — Telephone Encounter (Signed)
LMTCB to get more info 

## 2020-07-28 NOTE — Telephone Encounter (Signed)
Patient called in stated that she need another medication for her nerves the medication that she is on does not work she stated that it is making her worse

## 2020-07-29 ENCOUNTER — Telehealth: Payer: Self-pay | Admitting: Dietician

## 2020-07-29 NOTE — Telephone Encounter (Signed)
Patient has already stopped taking the buspar since it was making her worse. Also the elavil has not been helping so she was advised that she can stop taking that as well. Patient does not want to go to [facility] and see a PA; she wants to see an actual psychiatrist somewhere. I advised patient of message below about the xanax and klonopin and patient does not have daily anxiety that's why she does not want a daily medications; she needs something as needed. Patient has not had any manic episodes or any sx listed below. Patient unsure about taking prozac or anything else she has not been on before since she does not know how she will react. Patient states she already knows how she reacts and does on the xanax which is why she asked to be prescribed this again.

## 2020-07-29 NOTE — Telephone Encounter (Signed)
Patient also wants to know about getting the xanax prescription? And also what can she do about medication between now and being able to see someone else?

## 2020-07-29 NOTE — Telephone Encounter (Signed)
Spoke with patient about her medication and what is going on. Patient states she can not take the Buspar any longer as it has made her way worse; she is very moody and not herself. Patient states she has had 4 family members and her best friend tell her whatever she is taking she needs to stop because she is not doing well at all. Patient has been on lexapro and zoloft in the past but she can not take either of those. She has also been on xanax and klonopin in the past and they work for her. Patient stated that she does not need nor does she want an everyday anxiety medication; she states she needs a medication she can take as needed when things get out of hand and can calm her down quickly. Patient was very upset when I was on the phone with her as she states she is not feeling like she is being helped and being put off on psy. Also patient states she called [facility] as instructed for an appt but they do not have any psychiatrists there; they only have Pa's. She does not want to see a PA as they told her they can not do anything different then what her PCP can do. Patient expressed she is very frustrated with not being able to get back on the medication she does well on. Patient wants to see if this can be handled today especially with the holiday being tomorrow.

## 2020-07-29 NOTE — Telephone Encounter (Addendum)
Please call her and advise:  1. Ok to stop the Buspar 5 mg bid as it is making her feel worse and moody and not herself.  2. We also started Elavil at bedtime for HA prevention. This may be bothering her and may stop it if it has not helped the HA.  3.  She needs to see [facility] as those are Psychiatric trained PA's who will advise regarding her chronic anxiety medication. 4.  I do not think Klonopin or Xanax are good medications for her chronic anxiety problems. This is an extremely high habit forming  medication is not the treatment of choice for day to day anxiety. Please seek second opinion at [facility]  5. Does she have any manic type symptoms: swinging b/w depression/and mania- very happy, energetic, great new ideas/important plans, easily distracted, goes on shopping sprees or things that cause disastrous consequences, lack of appetite, waking up early suicidal thoughts. The answer: NO   6. If she arees: A  Trial  of Prozac 20 mg as it treats depression and panic attacks. She will need to stop the bedtime Elavil (taking for HA prevention) when she starts this.  7. If she declines [facility], I think we have exhausted the psych referral  options in [place]. Would she like a psychiatrist in [place]? If not, then she needs to give [facility] a chance.

## 2020-08-01 MED ORDER — HYDROXYZINE HCL 10 MG PO TABS: 10.0000 mg | ORAL_TABLET | Freq: Two times a day (BID) | ORAL | 0 refills | Status: DC | PRN

## 2020-08-01 NOTE — Telephone Encounter (Signed)
I spoke to Heather [name] about needing something for anxiety on an as needed basis. She does not want to take a daily medication.There is a strong family history of substance abuse in her parents and I am concerned that she may be genetically predisposed.  I am not in favor of Xanax or Klonopin for her. I will give her a trial of hydroxyzine 10 mg BID as needed for anxiety. I advised about drowsiness and to take her first dose at home in the evening to see how she does with it. She has a  f/up appt with Dr. Birdie Sons in December.   In addition,  I do think she will benefit the most from daily medication and since she has tried and failed several of the SSRIs, a psychiatry referral has been placed.  She would like to establish care with a psychiatrist-MD and not a PA. She has rejected [facility], [facility] and [employer] [facility] locally. She can drive to [place] as she lives lives close by. She has [employer].

## 2020-08-03 NOTE — Telephone Encounter (Signed)
Good morning!  Pt referral was faxed to [facility] in [place].

## 2020-08-04 ENCOUNTER — Telehealth (HOSPITAL_COMMUNITY): Payer: Self-pay | Admitting: *Deleted

## 2020-08-04 NOTE — Telephone Encounter (Signed)
Reaching out to patient to offer assistance regarding upcoming cardiac imaging study; pt verbalizes understanding of appt date/time.  Pt states that she has high anxiety and cannot go 12 hours without smoking. The smoking is the only thing that will calm her nerves. Pt educated about how nicotine could affect the HR. Pt wants to wait to have her scan so that she can see her psychiatrist for an anxiety medication prescription. Pt states her appointment with her psychiatrist is on 11/17.  Pt informed that someone from scheduling would reach out about rescheduling.  Burley Saver RN Navigator Cardiac Imaging [facility] [phone] office [phone] cell

## 2020-08-06 ENCOUNTER — Ambulatory Visit: Admission: RE | Admit: 2020-08-06 | Payer: Medicaid Other | Source: Ambulatory Visit

## 2020-08-10 ENCOUNTER — Other Ambulatory Visit: Payer: Self-pay

## 2020-08-10 MED ORDER — CYANOCOBALAMIN 1000 MCG/ML IJ SOLN: INTRAMUSCULAR | 4 refills | Status: DC

## 2020-08-10 NOTE — Progress Notes (Signed)
rx refill

## 2020-08-17 ENCOUNTER — Ambulatory Visit: Payer: Medicaid Other | Admitting: Cardiology

## 2020-08-19 ENCOUNTER — Other Ambulatory Visit: Payer: Self-pay

## 2020-08-21 ENCOUNTER — Telehealth (INDEPENDENT_AMBULATORY_CARE_PROVIDER_SITE_OTHER): Payer: Medicaid Other | Admitting: Family Medicine

## 2020-08-21 ENCOUNTER — Encounter: Payer: Self-pay | Admitting: Family Medicine

## 2020-08-21 ENCOUNTER — Other Ambulatory Visit: Payer: Self-pay

## 2020-08-21 DIAGNOSIS — F418 Other specified anxiety disorders: Secondary | ICD-10-CM

## 2020-08-21 DIAGNOSIS — F909 Attention-deficit hyperactivity disorder, unspecified type: Secondary | ICD-10-CM | POA: Insufficient documentation

## 2020-08-21 DIAGNOSIS — Z8659 Personal history of other mental and behavioral disorders: Secondary | ICD-10-CM

## 2020-08-21 NOTE — Assessment & Plan Note (Signed)
Discussed that the patient would need to have formal testing to confirm this diagnosis.  We will check with the psychiatry office that she saw to see if they do ADHD testing.
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
## 2020-08-21 NOTE — Assessment & Plan Note (Signed)
Patient with ongoing issues with this.  She saw psychiatry earlier today.  I discussed that she would benefit from daily medication for these issues.  She is going to see what the psychiatrist prescribed and let me know what medication it is.  She does have hydroxyzine on hand to take as needed.  I discussed she should try this as it may be beneficial for her acute anxiety issues.  Discussed the risk of drowsiness with this and advised that she should not drive while taking it.  She did ask if I would take over as her PCP though I noted that my patient panel is currently full and I am not taking new patients.  I encouraged her to consider  family practice who is taking over Kim's patient panel.  I did advise that I would provide coverage for this issue until she is able to establish with a new PCP.

## 2020-08-25 NOTE — Progress Notes (Signed)
I called to [facility] in [place] and the agent stated they do ADHD testing.  Heather [name],cma

## 2020-09-08 ENCOUNTER — Ambulatory Visit: Payer: Self-pay | Admitting: General Surgery

## 2020-09-08 DIAGNOSIS — K8020 Calculus of gallbladder without cholecystitis without obstruction: Secondary | ICD-10-CM | POA: Diagnosis not present

## 2020-09-08 NOTE — H&P (Signed)
History of Present Illness Heather Filler MD; 09/08/2020 9:27 AM) The patient is a 40 year old female who presents for evaluation of gall stones. Patient is a 40 year old female, who comes in secondary to abdominal pain. Patient has a history of anxiety and headaches. Patient states that approximately 6 months ago she began with epigastric abdominal pain. He states this was associated with nausea vomiting diarrhea. She states that since that time she's had continued episodes. She states that these are usually associated with spicy or high fatty foods.  Patient underwent ultrasound which I reviewed personally which revealed multiple gallstones. No signs of cholecystitis.     Allergies (Kheana Marshall-McBride, CMA; 09/08/2020 9:17 AM) No Known Drug Allergies  [09/08/2020]: Allergies Reconciled   Medication History (Kheana Marshall-McBride, CMA; 09/08/2020 9:17 AM) Gabapentin (300MG  Capsule, Oral) Active. Medications Reconciled    Review of Systems , MD; 09/08/2020 9:28 AM) General Present- Feeling well. Not Present- Fever. Respiratory Not Present- Cough and Difficulty Breathing. Cardiovascular Not Present- Chest Pain. Gastrointestinal Present- Abdominal Pain and Nausea. Musculoskeletal Not Present- Myalgia. Neurological Not Present- Weakness. All other systems negative  Vitals (Kheana Marshall-McBride CMA; 09/08/2020 9:18 AM) 09/08/2020 9:17 AM Weight: 152.13 lb Height: 65in Body Surface Area: 1.76 m Body Mass Index: 25.31 kg/m  Temp.: 97.72F  Pulse: 90 (Regular)  P.OX: 95% (Room air) BP: 110/68(Sitting, Left Arm, Standard)       Physical Exam 11/06/2020 MD; 09/08/2020 9:28 AM) The physical exam findings are as follows: Note: Constitutional: No acute distress, conversant, appears stated age  Eyes: Anicteric sclerae, moist conjunctiva, no lid lag  Neck: No thyromegaly, trachea midline, no cervical lymphadenopathy  Lungs: Clear to  auscultation biilaterally, normal respiratory effot  Cardiovascular: regular rate & rhythm, no murmurs, no peripheal edema, pedal pulses 2+  GI: Soft, no masses or hepatosplenomegaly, non-tender to palpation  MSK: Normal gait, no clubbing cyanosis, edema  Skin: No rashes, palpation reveals normal skin turgor  Psychiatric: Appropriate judgment and insight, oriented to person, place, and time    Assessment & Plan 11/06/2020 MD; 09/08/2020 9:28 AM) SYMPTOMATIC CHOLELITHIASIS (K80.20) Impression: patient is a 40 year old female with a history of smoking, headaches, comes in with symptomatic cholelithiasis  1. We will proceed to the operating room for a laparoscopic cholecystectomy  2. Risks and benefits were discussed with the patient to generally include, but not limited to: infection, bleeding, possible need for post op ERCP, damage to the bile ducts, bile leak, and possible need for further surgery. Alternatives were offered and described. All questions were answered and the patient voiced understanding of the procedure and wishes to proceed at this point with a laparoscopic cholecystectomy

## 2020-09-10 ENCOUNTER — Ambulatory Visit: Admission: RE | Admit: 2020-09-10 | Payer: Medicaid Other | Source: Ambulatory Visit

## 2020-09-16 ENCOUNTER — Telehealth (HOSPITAL_COMMUNITY): Payer: Self-pay | Admitting: Emergency Medicine

## 2020-09-16 NOTE — Telephone Encounter (Signed)
Calling patient to review CCTA instructions however patient wishes to cancel appt. States she will call back to r/s after she looks at her schedule.  Rockwell Alexandria RN Navigator Cardiac Imaging [facility] [phone] Office  [phone] Cell

## 2020-09-17 ENCOUNTER — Ambulatory Visit: Admission: RE | Admit: 2020-09-17 | Payer: Medicaid Other | Source: Ambulatory Visit

## 2020-09-24 ENCOUNTER — Ambulatory Visit: Payer: Medicaid Other | Admitting: Cardiology

## 2020-10-08 ENCOUNTER — Ambulatory Visit: Payer: Medicaid Other | Admitting: Neurology

## 2020-10-12 ENCOUNTER — Other Ambulatory Visit (HOSPITAL_COMMUNITY): Payer: Medicaid Other

## 2020-11-23 ENCOUNTER — Other Ambulatory Visit (HOSPITAL_COMMUNITY): Payer: Medicaid Other

## 2020-11-25 NOTE — Progress Notes (Addendum)
Patient states that she tested positive for covid in February 2022.   Patient states that she does not have a copy of her covid test but she would try to get it.    Patient also sounded congested on the phone and stated that she thinks she has a sinus infection.  Patient stated that she would reach out to Dr. Derrell Lolling.    Update:  Updated Heather [name] at Dr. Derrell Lolling office.

## 2020-12-05 IMAGING — US US ABDOMEN COMPLETE
1 series · 14 of 25 positions shown · non-contrast
Comparison: None.

CLINICAL DATA: 39-year-old female with atypical chest pain

EXAM:
ABDOMEN ULTRASOUND COMPLETE

[Series 1: us abdomen complete · 14 of 97 slices shown]
[im 1/97]
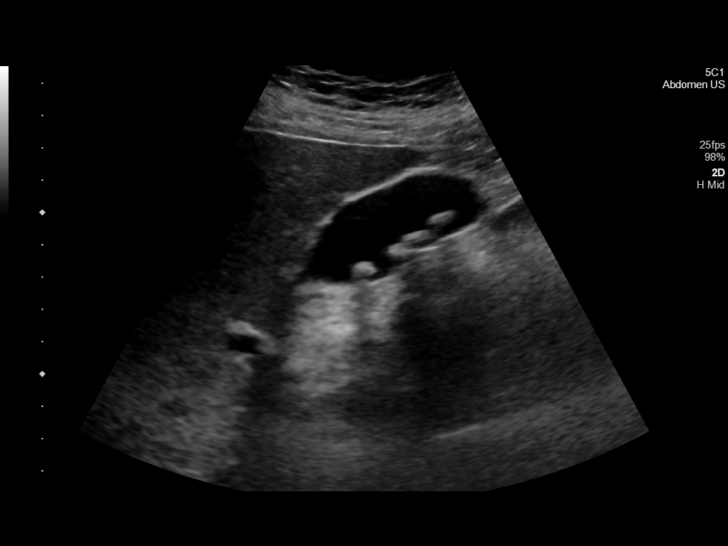
[im 9/97]
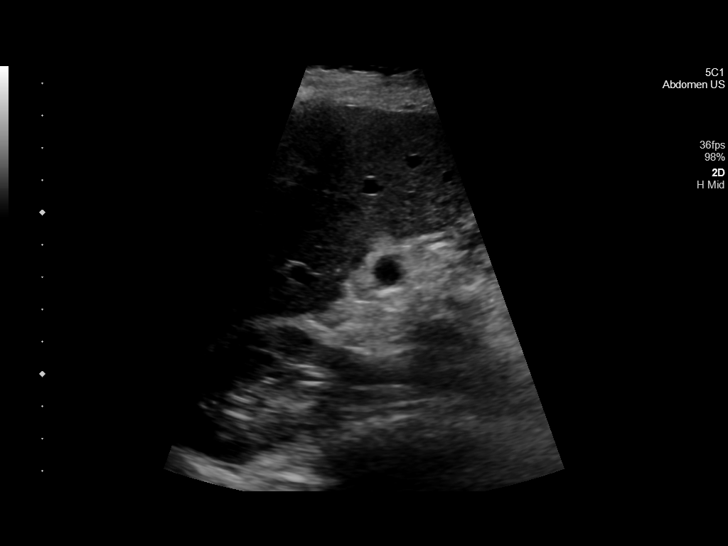
[im 17/97]
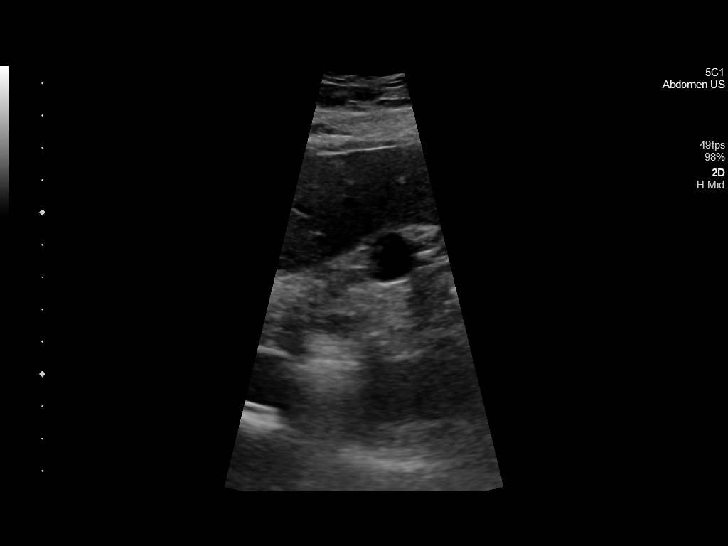
[im 25/97]
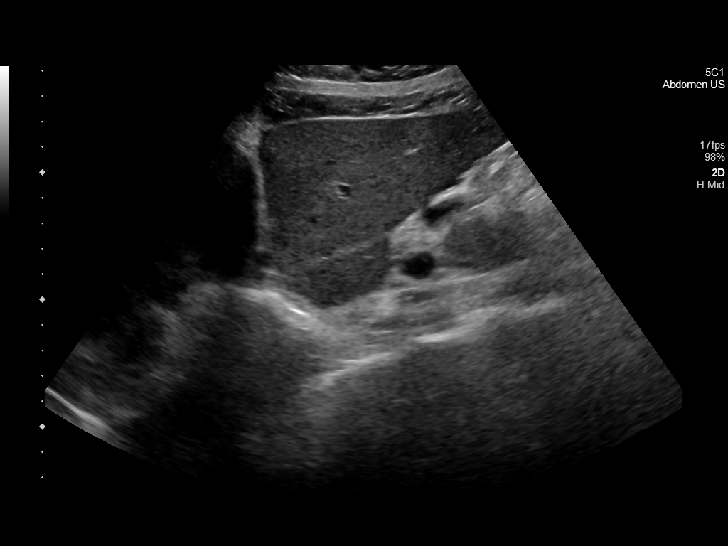
[im 33/97]
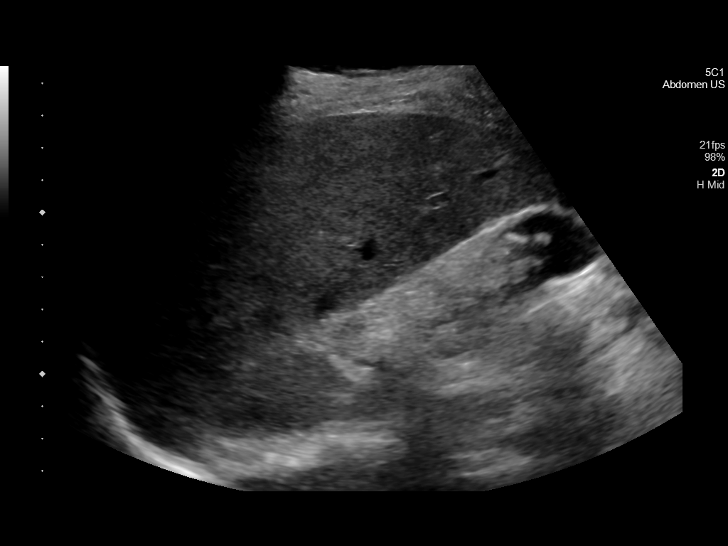
[im 37/97]
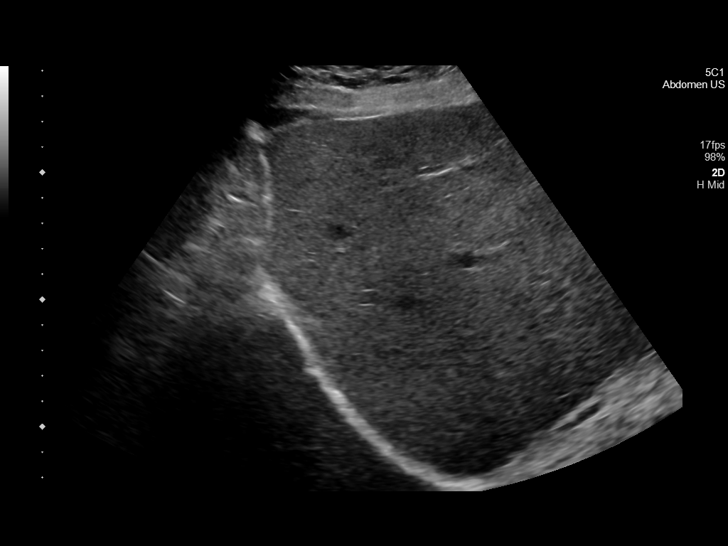
[im 45/97]
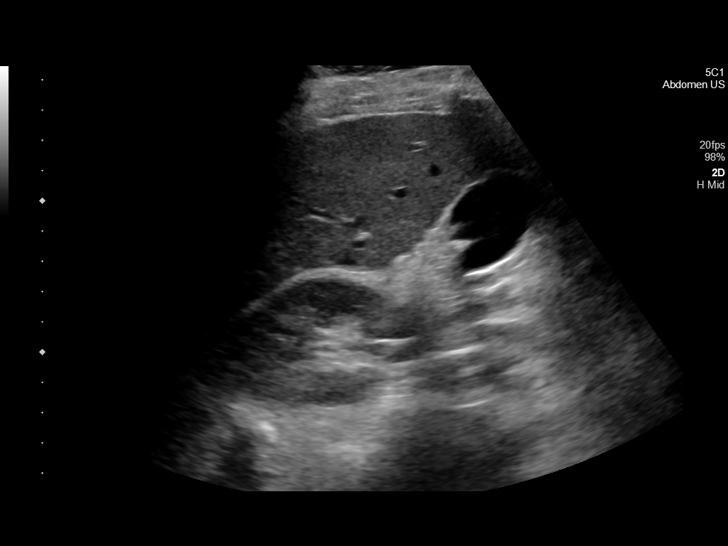
[im 53/97]
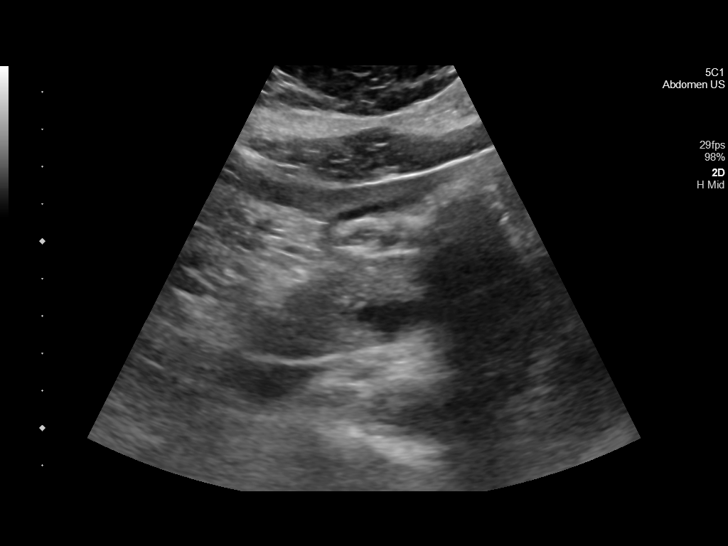
[im 61/97]
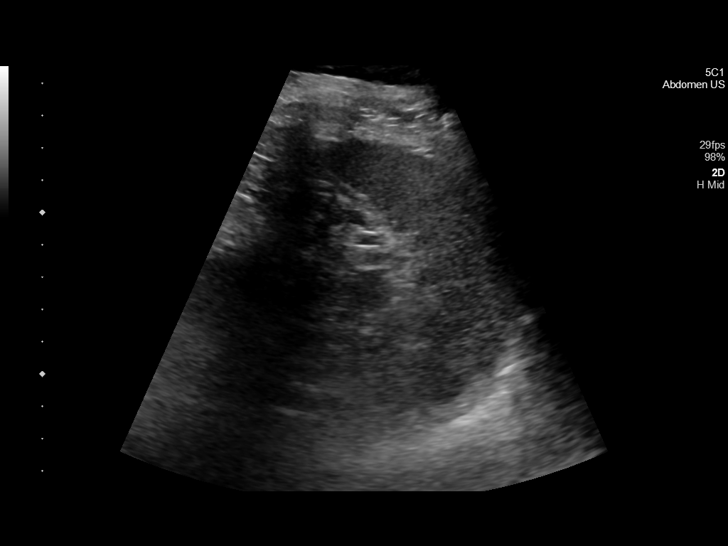
[im 65/97]
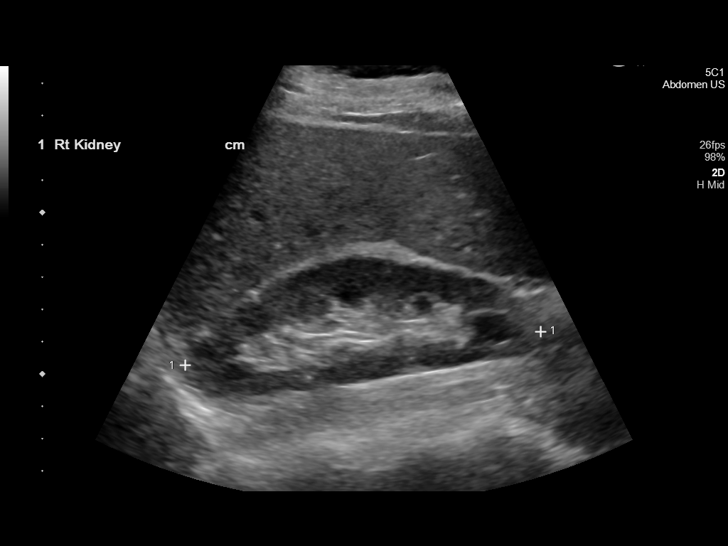
[im 73/97]
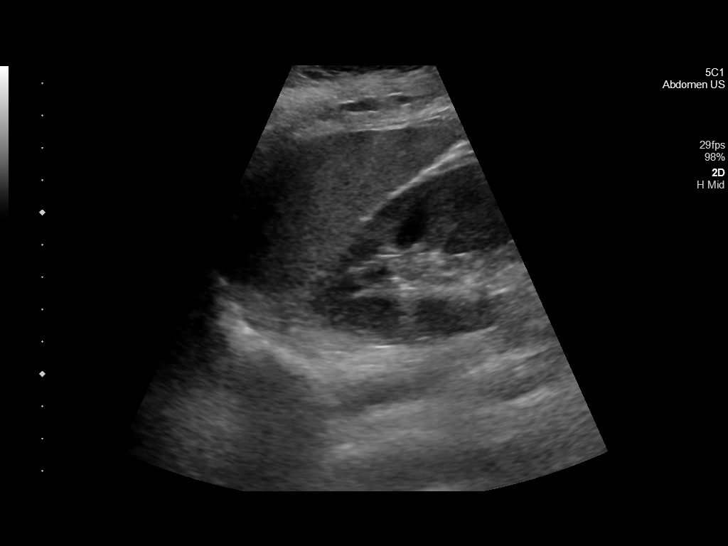
[im 81/97]
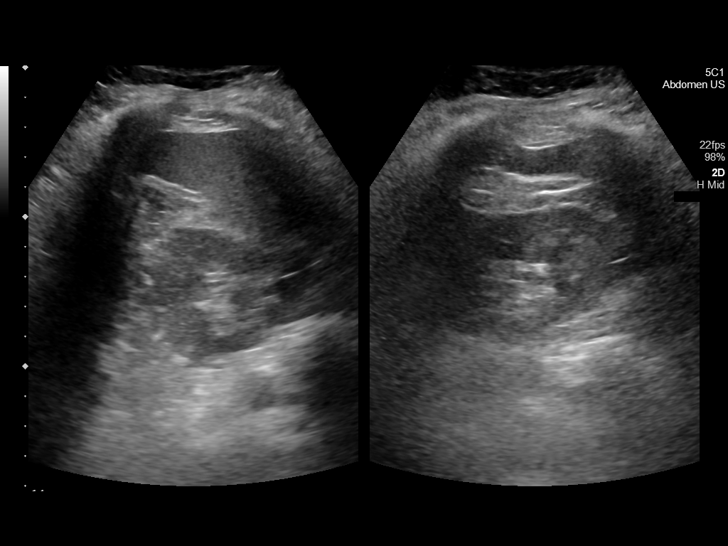
[im 89/97]
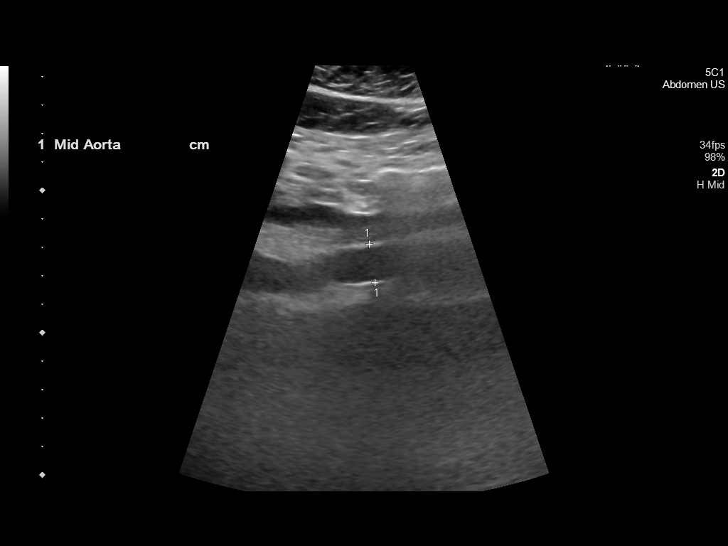
[im 97/97]
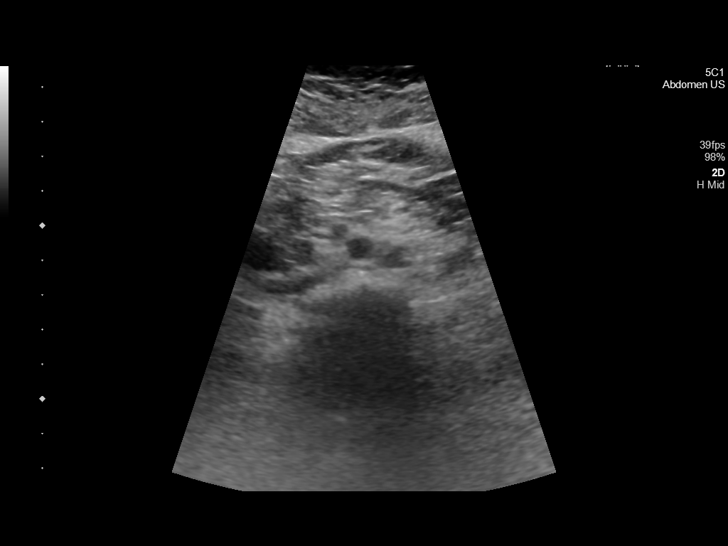

[14 of 25 positions shown; findings below may reference images not displayed]

FINDINGS: Gallbladder: Multiple hyperechoic foci within the gallbladder with
posterior shadowing compatible with cholelithiasis. No gallbladder
wall thickening. No pericholecystic fluid. Negative sonographic
Murphy's sign.

Common bile duct: Diameter: 2 mm-3 mm

Liver: No focal lesion identified. Within normal limits in
parenchymal echogenicity.

IVC: No abnormality visualized.

Pancreas: Visualized portion unremarkable.

Spleen: Size and appearance within normal limits.

Right Kidney: Length: 11.1 cm. Echogenicity within normal limits. No
mass or hydronephrosis visualized.

Left Kidney: Length: 11.0 cm. Echogenicity within normal limits. No
mass or hydronephrosis visualized.

Abdominal aorta: No aneurysm visualized.

Other findings: None.
IMPRESSION: Cholelithiasis without sonographic evidence of acute cholecystitis
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
## 2020-12-09 ENCOUNTER — Other Ambulatory Visit: Payer: Self-pay

## 2020-12-09 ENCOUNTER — Encounter: Payer: Self-pay | Admitting: Neurology

## 2020-12-09 ENCOUNTER — Ambulatory Visit: Payer: Medicaid Other | Admitting: Neurology

## 2020-12-09 VITALS — BP 96/65 | HR 82 | Ht 61.0 in | Wt 155.2 lb

## 2020-12-09 DIAGNOSIS — G4440 Drug-induced headache, not elsewhere classified, not intractable: Secondary | ICD-10-CM | POA: Diagnosis not present

## 2020-12-09 DIAGNOSIS — F909 Attention-deficit hyperactivity disorder, unspecified type: Secondary | ICD-10-CM | POA: Diagnosis not present

## 2020-12-09 DIAGNOSIS — G43709 Chronic migraine without aura, not intractable, without status migrainosus: Secondary | ICD-10-CM

## 2020-12-09 DIAGNOSIS — R413 Other amnesia: Secondary | ICD-10-CM

## 2020-12-09 DIAGNOSIS — F411 Generalized anxiety disorder: Secondary | ICD-10-CM | POA: Diagnosis not present

## 2020-12-09 MED ORDER — SUMATRIPTAN SUCCINATE 100 MG PO TABS: ORAL_TABLET | ORAL | 5 refills | Status: DC

## 2020-12-09 MED ORDER — EMGALITY 120 MG/ML ~~LOC~~ SOAJ: 120.0000 mg | SUBCUTANEOUS | 5 refills | Status: DC

## 2020-12-09 NOTE — Patient Instructions (Signed)
  1. Start Emgality every 28 days.  Take 2 injections for first dose and then 1 injection every 28 days 2. Take sumatriptan 100mg  at earliest onset of headache.  May repeat dose once in 2 hours if needed.  Maximum 2 tablets in 24 hours. 3. Limit use of pain relievers to no more than 2 days out of the week.  These medications include acetaminophen, NSAIDs (ibuprofen/Advil/Motrin, naproxen/Aleve, triptans (Imitrex/sumatriptan), Excedrin, and narcotics.  This will help reduce risk of rebound headaches. 4. Be aware of common food triggers:  - Caffeine:  coffee, black tea, cola, Mt. Dew  - Chocolate  - Dairy:  aged cheeses (brie, blue, cheddar, gouda, [place], provolone, [place], Swiss, etc), chocolate milk, buttermilk, sour cream, limit eggs and yogurt  - Nuts, peanut butter  - Alcohol  - Cereals/grains:  FRESH breads (fresh bagels, sourdough, doughnuts), yeast productions  - Processed/canned/aged/cured meats (pre-packaged deli meats, hotdogs)  - MSG/glutamate:  soy sauce, flavor enhancer, pickled/preserved/marinated foods  - Sweeteners:  aspartame (Equal, Nutrasweet).  Sugar and Splenda are okay  - Vegetables:  legumes (lima beans, lentils, snow peas, fava beans, pinto peans, peas, garbanzo beans), sauerkraut, onions, olives, pickles  - Fruit:  avocados, bananas, citrus fruit (orange, lemon, grapefruit), mango  - Other:  Frozen meals, macaroni and cheese 5. Routine exercise 6. Stay adequately hydrated (aim for 64 oz water daily) 7. Keep headache diary 8. Maintain proper stress management 9. Maintain proper sleep hygiene 10. Do not skip meals 11. Consider supplements:  magnesium citrate 400mg  daily, riboflavin 400mg  daily, coenzyme Q10 100mg  three times daily. 12.  Neuropsychological testing 13.  Follow up 4 to 6 months

## 2020-12-09 NOTE — Progress Notes (Signed)
Merwyn Katos KeyRudean Hitt - PA Case ID: [id number] - Rx #: [id number] Need help? Call us at [phone] Outcome Approvedtoday PA Case: [id number], Status: Approved, Coverage Starts on: 12/09/2020 12:00:00 AM, Coverage Ends on: 03/09/2021 12:00:00 AM. Drug Emgality 120MG /ML auto-injectors (migraine) Form [employer] [facility] Electronic [facility] Form [phone] NCPDP) Original Claim Info 81

## 2020-12-31 DIAGNOSIS — T24199A Burn of first degree of multiple sites of unspecified lower limb, except ankle and foot, initial encounter: Secondary | ICD-10-CM | POA: Diagnosis not present

## 2021-01-06 ENCOUNTER — Encounter (HOSPITAL_COMMUNITY): Payer: Self-pay | Admitting: General Surgery

## 2021-01-06 NOTE — Progress Notes (Signed)
Patient did not get covid test and stated she forgot about surgery and cannot do the surgery tomorrow 01/07/21.

## 2021-01-07 ENCOUNTER — Ambulatory Visit: Payer: Self-pay | Admitting: General Surgery

## 2021-01-07 ENCOUNTER — Ambulatory Visit (HOSPITAL_COMMUNITY): Admission: RE | Admit: 2021-01-07 | Payer: Medicaid Other | Source: Home / Self Care | Admitting: General Surgery

## 2021-01-07 SURGERY — LAPAROSCOPIC CHOLECYSTECTOMY
Anesthesia: General

## 2021-01-19 ENCOUNTER — Ambulatory Visit: Payer: Medicaid Other | Admitting: Obstetrics & Gynecology

## 2021-02-04 ENCOUNTER — Ambulatory Visit (INDEPENDENT_AMBULATORY_CARE_PROVIDER_SITE_OTHER): Payer: Medicaid Other | Admitting: Psychology

## 2021-02-04 DIAGNOSIS — R413 Other amnesia: Secondary | ICD-10-CM

## 2021-02-04 NOTE — Progress Notes (Signed)
   NEUROPSYCHOLOGICAL EVALUATION [place] [place] Department of Neurology     Heather [name] called and informed our office the morning of her appointment that she was feeling ill and had to cancel her evaluation. She will be rescheduled at a later date.

## 2021-02-17 ENCOUNTER — Encounter: Payer: Medicaid Other | Admitting: Psychology

## 2021-03-15 NOTE — Progress Notes (Signed)
Merwyn [place]: [id number] - PA Case ID: [id number] Need help? Call us at [phone] Outcome Approvedtoday [place] Case: [id number], Status: Approved, Coverage Starts on: 03/15/2021 12:00:00 AM, Coverage Ends on: 03/15/2022 12:00:00 AM. Drug Emgality 120MG /ML auto-injectors (migraine) Form [employer] [facility] Electronic [facility] Form (2

## 2021-03-29 NOTE — Progress Notes (Signed)
Surgical Instructions   Your procedure is scheduled on Friday April 02, 2021.  Report to [facility] Main Entrance "A" at 05:30 A.M., then check in with the Admitting office.  Call [phone] if you have problems or questions between now and the morning of surgery:   Remember: Do not eat after midnight the night before your surgery  You may drink clear liquids until 04:30am the morning of your surgery.   Clear liquids allowed are: Water, Non-Citrus Juices (without pulp), Carbonated Beverages, Clear Tea, Black Coffee Only, and Gatorade  Please complete your PRE-SURGERY ENSURE that was provided to you by 04:30am the morning of surgery.  Please, if able, drink it in one sitting. DO NOT SIP.    Take these medicines the morning of surgery with A SIP OF WATER: NONE    If needed you may take these medications the morning of surgery: Sumatriptan (Imitrex)   As of today, STOP taking any Aspirin (unless otherwise instructed by your surgeon) or Aspirin-containing products; NSAIDS - Aleve, Naproxen, Ibuprofen, Motrin, Advil, Goody's, BC's, all herbal medications, fish oil, and all vitamins.          Do not wear jewelry or makeup Do not wear lotions, powders, perfumes/colognes, or deodorant. Do not shave 48 hours prior to surgery.   Do not wear nail polish, gel polish, artificial nails, or any other type of covering on natural nails including fingernails and toenails. If patients have artificial nails, gel coating, etc. that need to be removed by a nail salon please have this removed prior to surgery or surgery may need to be canceled/delayed if the surgeon/ anesthesia feels like the patient is unable to be adequately monitored. Do not bring valuables to the hospital - [facility] is not responsible for any belongings or valuables.              Do NOT Smoke (Tobacco/Vaping) or drink Alcohol 24 hours prior to your procedure  If you use a CPAP at night, you may bring all equipment for your  overnight stay.   Contacts, glasses, hearing aids, dentures or partials may not be worn into surgery, please bring cases for these belongings   For patients admitted to the hospital, discharge time will be determined by your treatment team.   Patients discharged the day of surgery will not be allowed to drive home, and someone needs to stay with them for 24 hours.  ONLY ONE (1) SUPPORT PERSON MAY WAIT IN THE WAITING AREA WHILE YOU ARE IN SURGERY. NO VISITORS WILL BE ALLOWED IN PRE-OP WHERE PATIENTS GET READY FOR SURGERY.  TWO (2) VISITORS WILL BE ALLOWED IN YOUR ROOM IF YOU ARE ADMITTED AFTER SURGERY.  Minor children may have two parents present. Special consideration for safety and communication needs will be reviewed on a case by case basis.  Special instructions:    Oral Hygiene is also important to reduce your risk of infection.  Remember - BRUSH YOUR TEETH THE MORNING OF SURGERY WITH YOUR REGULAR TOOTHPASTE   [place]- Preparing For Surgery  Before surgery, you can play an important role. Because skin is not sterile, your skin needs to be as free of germs as possible. You can reduce the number of germs on your skin by washing with CHG (chlorahexidine gluconate) Soap before surgery.  CHG is an antiseptic cleaner which kills germs and bonds with the skin to continue killing germs even after washing.     Please do not use if you have an allergy  to CHG or antibacterial soaps. If your skin becomes reddened/irritated stop using the CHG.  Do not shave (including legs and underarms) for at least 48 hours prior to first CHG shower. It is OK to shave your face.  Please follow these instructions carefully.     Shower the NIGHT BEFORE SURGERY and the MORNING OF SURGERY with CHG Soap.   If you chose to wash your hair, wash your hair first as usual with your normal shampoo. After you shampoo, rinse your hair and body thoroughly to remove the shampoo.    Then [employer] and genitals (private  parts) with your normal soap and rinse thoroughly to remove soap.  Next use the CHG Soap as you would any other liquid soap. You can apply CHG directly to the skin and wash gently with a clean washcloth.   Apply the CHG Soap to your body ONLY FROM THE NECK DOWN.  Do not use on open wounds or open sores. Avoid contact with your eyes, ears, mouth and genitals (private parts). Wash Face and genitals (private parts)  with your normal soap.   Wash thoroughly, paying special attention to the area where your surgery will be performed.  Thoroughly rinse your body with warm water from the neck down.  DO NOT shower/wash with your normal soap after using and rinsing off the CHG Soap.  Pat yourself dry with a CLEAN TOWEL.  Wear CLEAN PAJAMAS to bed the night before surgery  Place CLEAN SHEETS on your bed the night before your surgery  DO NOT SLEEP WITH PETS.   Day of Surgery:  Take a shower with CHG soap. Wear Clean/Comfortable clothing the morning of surgery Do not apply any deodorants/lotions.   Remember to brush your teeth WITH YOUR REGULAR TOOTHPASTE.   Please read over the following fact sheets that you were given.

## 2021-03-30 ENCOUNTER — Inpatient Hospital Stay (HOSPITAL_COMMUNITY)
Admission: RE | Admit: 2021-03-30 | Discharge: 2021-03-30 | Disposition: A | Discharge status: Home / Self Care | Payer: Medicaid Other | Source: Ambulatory Visit

## 2021-03-30 ENCOUNTER — Other Ambulatory Visit (HOSPITAL_COMMUNITY): Payer: Medicaid Other

## 2021-03-30 NOTE — Progress Notes (Signed)
Patient didn't come for PAT appointment today at 9 AM. Staff called her and her husband with no success. The scheduler was notified.

## 2021-04-13 ENCOUNTER — Ambulatory Visit (INDEPENDENT_AMBULATORY_CARE_PROVIDER_SITE_OTHER): Payer: Medicaid Other | Admitting: Psychology

## 2021-04-13 ENCOUNTER — Other Ambulatory Visit: Payer: Self-pay

## 2021-04-13 ENCOUNTER — Ambulatory Visit: Payer: Medicaid Other

## 2021-04-13 ENCOUNTER — Encounter: Payer: Self-pay | Admitting: Psychology

## 2021-04-13 DIAGNOSIS — Z62810 Personal history of physical and sexual abuse in childhood: Secondary | ICD-10-CM | POA: Diagnosis not present

## 2021-04-13 DIAGNOSIS — F411 Generalized anxiety disorder: Secondary | ICD-10-CM

## 2021-04-13 DIAGNOSIS — F331 Major depressive disorder, recurrent, moderate: Secondary | ICD-10-CM

## 2021-04-13 DIAGNOSIS — R4189 Other symptoms and signs involving cognitive functions and awareness: Secondary | ICD-10-CM

## 2021-04-13 DIAGNOSIS — R41840 Attention and concentration deficit: Secondary | ICD-10-CM | POA: Diagnosis not present

## 2021-04-13 NOTE — Progress Notes (Signed)
   Psychometrician Note   Cognitive testing was administered to Heather [name] by Shan Levans, B.S. (psychometrist) under the supervision of Dr. Newman Nickels, Ph.D., licensed psychologist on 04/13/2021. Heather [name] did not appear overtly distressed by the testing session per behavioral observation or responses across self-report questionnaires. Rest breaks were offered.    The battery of tests administered was selected by Dr. Newman Nickels, Ph.D. with consideration to Heather [name]'s current level of functioning, the nature of her symptoms, emotional and behavioral responses during interview, level of literacy, observed level of motivation/effort, and the nature of the referral question. This battery was communicated to the psychometrist. Communication between Dr. Newman Nickels, Ph.D. and the psychometrist was ongoing throughout the evaluation and Dr. Newman Nickels, Ph.D. was immediately accessible at all times. Dr. Newman Nickels, Ph.D. provided supervision to the psychometrist on the date of this service to the extent necessary to assure the quality of all services provided.    Heather [name] will return within approximately 1-2 weeks for an interactive feedback session with Dr. Milbert Coulter at which time her test performances, clinical impressions, and treatment recommendations will be reviewed in detail. Heather [name] understands she can contact our office should she require our assistance before this time.  A total of 185 minutes of billable time were spent face-to-face with Heather [name] by the psychometrist. This includes both test administration and scoring time. Billing for these services is reflected in the clinical report generated by Dr. Newman Nickels, Ph.D.  This note reflects time spent with the psychometrician and does not include test scores or any clinical interpretations made by Dr. Milbert Coulter. The full report will follow in a separate note.

## 2021-04-13 NOTE — Progress Notes (Signed)
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       D-KEFS Color-Word Interference Test: Raw Score (Scaled Score) Percentile     Color Naming 28 secs. (10) 50 Average    Word Reading 21 secs. (11) 63 Average    Inhibition 73 secs. (5) 5 Well Below Average      Total Errors 1 error (10) 50 Average    Inhibition/Switching 102 secs. (1) <1 Exceptionally Low      Total Errors 0 errors (12) 75 Above Average       D-KEFS Verbal Fluency Test: Raw Score (Scaled Score) Percentile     Letter Total Correct 24 (6) 9 Below Average    Category Total Correct 44 (12) 75 Above Average    Category Switching Total Correct 17 (14) 91 Well Above Average    Category Switching Accuracy 16 (14) 91 Well Above Average      Total Set Loss Errors 0 (13) 84 Above Average      Total Repetition Errors 1 (11) 63 Average       Language:     NAB Language Module, Form 1: T Score Percentile     Naming 31/31 (57) 75 Above Average       Visuospatial/Visuoconstruction:      Raw Score Percentile   Clock Drawing: 8/10 --- Within Normal Limits       NAB Spatial Module, Form 1: T Score Percentile     Figure Drawing Copy 64 92 Well Above Average       Mood  and Personality:      Raw Score Percentile   Beck Depression Inventory - II: 10 ---  Within Normal Limits  PROMIS Anxiety Questionnaire: 18 --- Mild       Personality Assessment Inventory: T Score  Percentile     Inconsistency 37 --- Within Normal Limits    Infrequency 55 --- Within Normal Limits    Negative Impression 73 --- Moderate    Positive Impression 43 --- Within Normal Limits    Somatic Complaints 67 --- Within Normal Limits    Anxiety 68 --- Within Normal Limits    Anxiety-Related Disorders 66 --- Within Normal Limits    Depression 72 --- Elevated    Mania 53 --- Within Normal Limits    Paranoia 62 --- Within Normal Limits    Schizophrenia 65 --- Within Normal Limits    Borderline Features 67 --- Within Normal Limits    Antisocial Features 54 --- Within Normal Limits    Alcohol Problems 41 --- Within Normal Limits    Drug Problems 42 --- Within Normal Limits    Aggression 72 --- Elevated    Suicidal Ideation 43 --- Within Normal Limits    Stress 64 --- Within Normal Limits    Non Support 53 --- Within Normal Limits    Treatment Rejection 40 --- Within Normal Limits    Dominance 58 --- Within Normal Limits    Warmth 40 --- Within Normal Limits       Additional Questionnaires:      Raw Score Percentile   PROMIS Sleep Disturbance Questionnaire: 34 --- Moderate   Informed Consent and Coding/Compliance:   The current evaluation represents a clinical evaluation for the purposes previously outlined by the referral source and is in no way reflective of a forensic evaluation.   Ms. Suliman was provided with a verbal description of the nature and purpose of the present neuropsychological evaluation. Also reviewed were the foreseeable risks and/or discomforts and benefits of the procedure, limits of confidentiality, and mandatory reporting requirements of this provider. The patient was given the opportunity to ask questions and receive answers about the evaluation. Oral consent to  participate was provided by the patient.   This evaluation was conducted by Christia Reading, Ph.D., licensed clinical neuropsychologist. Ms. Schlie completed a clinical interview with Dr. Melvyn Novas, billed as one unit [phone], and 185 minutes of cognitive testing and scoring, billed as one unit [phone] and five additional units [id number]. Psychometrist Cruzita Lederer, B.S., assisted Dr. Melvyn Novas with test administration and scoring procedures. As a separate and discrete service, Dr. Melvyn Novas spent a total of 160 minutes in interpretation and report writing billed as one unit [phone] and two units [id number].

## 2021-04-27 ENCOUNTER — Ambulatory Visit: Payer: Self-pay | Admitting: General Surgery

## 2021-04-27 NOTE — Progress Notes (Signed)
Surgical Instructions    Your procedure is scheduled on Tuesday 05/11/21.   Report to [facility] Main Entrance "A" at 05:30 A.M., then check in with the Admitting office.  Call this number if you have problems the morning of surgery:  [phone]   If you have any questions prior to your surgery date call [phone]: Open Monday-Friday 8am-4pm    Remember:  Do not eat or drink after midnight the night before your surgery     Take these medicines the morning of surgery with A SIP OF WATER   NONE  As of today, STOP taking any Aspirin (unless otherwise instructed by your surgeon) Aleve, Naproxen, Ibuprofen, Motrin, Advil, Goody's, BC's, all herbal medications, fish oil, and all vitamins.                     Do NOT Smoke (Tobacco/Vaping) or drink Alcohol 24 hours prior to your procedure.  If you use a CPAP at night, you may bring all equipment for your overnight stay.   Contacts, glasses, piercing's, hearing aid's, dentures or partials may not be worn into surgery, please bring cases for these belongings.    For patients admitted to the hospital, discharge time will be determined by your treatment team.   Patients discharged the day of surgery will not be allowed to drive home, and someone needs to stay with them for 24 hours.  ONLY 1 SUPPORT PERSON MAY BE PRESENT WHILE YOU ARE IN SURGERY. IF YOU ARE TO BE ADMITTED ONCE YOU ARE IN YOUR ROOM YOU WILL BE ALLOWED TWO (2) VISITORS.  Minor children may have two parents present. Special consideration for safety and communication needs will be reviewed on a case by case basis.   Special instructions:   Heather [name]- Preparing For Surgery  Before surgery, you can play an important role. Because skin is not sterile, your skin needs to be as free of germs as possible. You can reduce the number of germs on your skin by washing with CHG (chlorahexidine gluconate) Soap before surgery.  CHG is an antiseptic cleaner which kills germs and bonds  with the skin to continue killing germs even after washing.    Oral Hygiene is also important to reduce your risk of infection.  Remember - BRUSH YOUR TEETH THE MORNING OF SURGERY WITH YOUR REGULAR TOOTHPASTE  Please do not use if you have an allergy to CHG or antibacterial soaps. If your skin becomes reddened/irritated stop using the CHG.  Do not shave (including legs and underarms) for at least 48 hours prior to first CHG shower. It is OK to shave your face.  Please follow these instructions carefully.   Shower the NIGHT BEFORE SURGERY and the MORNING OF SURGERY  If you chose to wash your hair, wash your hair first as usual with your normal shampoo.  After you shampoo, rinse your hair and body thoroughly to remove the shampoo.  Use CHG Soap as you would any other liquid soap. You can apply CHG directly to the skin and wash gently with a scrungie or a clean washcloth.   Apply the CHG Soap to your body ONLY FROM THE NECK DOWN.  Do not use on open wounds or open sores. Avoid contact with your eyes, ears, mouth and genitals (private parts). Wash Face and genitals (private parts)  with your normal soap.   Wash thoroughly, paying special attention to the area where your surgery will be performed.  Thoroughly rinse your body with warm  water from the neck down.  DO NOT shower/wash with your normal soap after using and rinsing off the CHG Soap.  Pat yourself dry with a CLEAN TOWEL.  Wear CLEAN PAJAMAS to bed the night before surgery  Place CLEAN SHEETS on your bed the night before your surgery  DO NOT SLEEP WITH PETS.   Day of Surgery: Shower with CHG soap. Do not wear jewelry, make up, nail polish, gel polish, artificial nails, or any other type of covering on natural nails including finger and toenails. If patients have artificial nails, gel coating, etc. that need to be removed by a nail salon please have this removed prior to surgery. Surgery may need to be canceled/delayed if the  surgeon/ anesthesia feels like the patient is unable to be adequately monitored. Do not wear lotions, powders, perfumes/colognes, or deodorant. Do not shave 48 hours prior to surgery.  Men may shave face and neck. Do not bring valuables to the hospital. [facility] is not responsible for any belongings or valuables. Wear Clean/Comfortable clothing the morning of surgery Remember to brush your teeth WITH YOUR REGULAR TOOTHPASTE.   Please read over the following fact sheets that you were given.

## 2021-04-28 ENCOUNTER — Inpatient Hospital Stay (HOSPITAL_COMMUNITY)
Admission: RE | Admit: 2021-04-28 | Discharge: 2021-04-28 | Disposition: A | Discharge status: Home / Self Care | Payer: Medicaid Other | Source: Ambulatory Visit

## 2021-04-28 ENCOUNTER — Other Ambulatory Visit: Payer: Self-pay

## 2021-04-28 ENCOUNTER — Ambulatory Visit (INDEPENDENT_AMBULATORY_CARE_PROVIDER_SITE_OTHER): Payer: Medicaid Other | Admitting: Psychology

## 2021-04-28 DIAGNOSIS — F331 Major depressive disorder, recurrent, moderate: Secondary | ICD-10-CM

## 2021-04-28 DIAGNOSIS — Z62810 Personal history of physical and sexual abuse in childhood: Secondary | ICD-10-CM

## 2021-04-28 DIAGNOSIS — F411 Generalized anxiety disorder: Secondary | ICD-10-CM | POA: Diagnosis not present

## 2021-04-28 DIAGNOSIS — R41840 Attention and concentration deficit: Secondary | ICD-10-CM

## 2021-04-28 NOTE — Progress Notes (Signed)
   Neuropsychology Feedback Session Heather [name]. [facility] Department of Neurology  Reason for Referral:   Heather [name] is a 40 y.o. right-handed Caucasian female referred by Shon Millet, D.O., to characterize her current cognitive functioning and assist with diagnostic clarity and treatment planning in the context of subjective cognitive decline, frequent headaches, and several psychiatric comorbidities.   Feedback:   Heather [name] completed a comprehensive neuropsychological evaluation on 04/13/2021. Please refer to that encounter for the full report and recommendations. Briefly, results suggested a relative weakness surrounding executive functioning with an impairment seen across a task assessing response inhibition. However, there was exhibited variability with some performances scoring within appropriate normative ranges relative to premorbid intellectual estimations within this domain. Outside of executive functioning, all other performances were appropriate. This includes processing speed, attention/concentration, receptive and expressive language and visuospatial abilities. Despite her report of memory dysfunction, scores across verbal and visual memory tests were quite strong across the evaluation. The most likely cause for subjective dysfunction and some variability across testing is her underlying history of ADHD, exacerbated by ongoing psychiatric distress (including her past trauma history and concerns for PTSD), sleep dysfunction, and frequent headaches.   Heather [name] was unaccompanied during the current feedback session. Heather [name] was given the opportunity to ask questions and her questions were answered. She was encouraged to reach out should additional questions arise. A copy of her report was provided at the conclusion of the visit.      35 minutes were spent conducting the current feedback session with Heather [name], billed as one unit [phone].

## 2021-04-28 NOTE — Progress Notes (Signed)
Pt did not show up for her PAT appt. Pt contacted, no response, no returned call. Chart returned to [facility].

## 2021-04-29 NOTE — Addendum Note (Signed)
Addended by: Rosann Auerbach C on: 04/29/2021 11:39 AM   Modules accepted: Orders

## 2021-05-06 ENCOUNTER — Encounter (HOSPITAL_COMMUNITY): Payer: Self-pay | Admitting: General Surgery

## 2021-05-06 NOTE — Progress Notes (Signed)
Sdw call incomplete - upon letting pt know about current hospital visitation policy and short stay policy of no visitors in the pre-op area, pt decided to cancel surgery, did not want to complete pre-op call. Pt informed that an exception would be possible and just needed to verify and give pt a call back, pt refused and said "I'd rather just live with my gallstones and wait until the hospital returns to normal". Pt informed that I could check to verify that her husband would be allowed to come back, but I needed to call her back, pt still firmly insisted that she was cancelling and did not want the surgery because husband could not be in the pre-op with her until she was put to sleep, and be there in PACU as soon as she woke up. Pt not interested for me to verify if exception would be made for her and insisted she was cancelling surgery and would call Dr. Jacinto Halim office.

## 2021-05-06 NOTE — Progress Notes (Signed)
Pt will be followed-up with in regards to making exception to husband accompanying her into pre-op. Sherian Rein, RN aware.

## 2021-05-06 NOTE — Progress Notes (Signed)
TC to patient regarding exemption request. I feel it is appropriate to grant this exemption. Husband will be allowed in the pre op bay prior to surgery on 05/11/2021.

## 2021-05-07 ENCOUNTER — Encounter (HOSPITAL_COMMUNITY): Payer: Self-pay | Admitting: Physician Assistant

## 2021-05-07 NOTE — Progress Notes (Signed)
DUE TO COVID-19 ONLY ONE VISITOR IS ALLOWED TO COME WITH YOU AND STAY IN THE WAITING ROOM ONLY DURING PRE OP AND PROCEDURE DAY OF SURGERY.   PCP - Amedeo Kinsman, NP Cardiologist - n/a  Chest x-ray - 06/01/20 EKG - 06/22/20 Stress Test - n/a ECHO - 07/14/20 Cardiac Cath - n/a  Sleep Study -  n/a CPAP - none  ERAS: Clear liquids til 0430 on DOS.  Clear liquids reviewed with patient.  Anesthesia review: Yes  STOP now taking any Aspirin (unless otherwise instructed by your surgeon), Aleve, Naproxen, Ibuprofen, Motrin, Advil, Goody's, BC's, all herbal medications, fish oil, and all vitamins.   Coronavirus Screening Covid test n/a- Ambulatory Surgery  Do you have any of the following symptoms:  Cough yes/no: No Fever (>100.46F)  yes/no: No Runny nose yes/no: No Sore throat yes/no: No Difficulty breathing/shortness of breath  yes/no: No  Have you traveled in the last 14 days and where? yes/no: No  Patient verbalized understanding of instructions that were given via phone.

## 2021-05-10 NOTE — Anesthesia Preprocedure Evaluation (Deleted)
Anesthesia Evaluation    Reviewed: Allergy & Precautions, [name] Chart, lab work & pertinent test results  Airway        Dental   Pulmonary Current Smoker,           Cardiovascular negative cardio ROS       Neuro/Psych  Headaches, PSYCHIATRIC DISORDERS Anxiety Depression    GI/Hepatic negative GI ROS, Neg liver ROS,   Endo/Other  negative endocrine ROS  Renal/GU negative Renal ROS     Musculoskeletal negative musculoskeletal ROS (+)   Abdominal   Peds  (+) ADHD Hematology negative hematology ROS (+)   Anesthesia Other Findings GALLSTONES  Reproductive/Obstetrics                             Anesthesia Physical Anesthesia Plan  ASA: 3  Anesthesia Plan: General   Post-op Pain Management:    Induction: Intravenous  PONV Risk Score and Plan: Scopolamine patch - Pre-op, Midazolam, Dexamethasone, Ondansetron and Treatment may vary due to age or medical condition  Airway Management Planned: Oral ETT  Additional Equipment:   Intra-op Plan:   Post-operative Plan: Extubation in OR  Informed Consent:   Plan Discussed with:   Anesthesia Plan Comments:         Anesthesia Quick Evaluation

## 2021-05-11 ENCOUNTER — Ambulatory Visit (HOSPITAL_COMMUNITY): Admission: RE | Admit: 2021-05-11 | Payer: Medicaid Other | Source: Home / Self Care | Admitting: General Surgery

## 2021-05-11 HISTORY — DX: Family history of other specified conditions: Z84.89

## 2021-05-11 SURGERY — LAPAROSCOPIC CHOLECYSTECTOMY
Anesthesia: General

## 2021-05-11 NOTE — Progress Notes (Signed)
Patient has not arrived for procedure.  Attempted to reach patient, no answer.  Left voicemail.   Dr. Derrell Lolling has been paged.  OR desk aware.

## 2021-05-12 ENCOUNTER — Telehealth: Payer: Self-pay | Admitting: Psychology

## 2021-05-12 NOTE — Telephone Encounter (Signed)
Pt called back in after calling [facility]. They kept telling her she needed to see a psychologist not a psychiatrist even though Dr. Milbert Coulter told her to see a psychiatrist. They also told her she needed to do some testing, but did not seem to know what neuropsych testing was. The patient was frustrated with them and does not want to go there. She would like a list of a few other places she could call. She stated she has a dentist appointment at 12:45 tomorrow, so she will not be able to answer until after 1:45.

## 2021-05-28 ENCOUNTER — Other Ambulatory Visit: Payer: Self-pay

## 2021-05-28 ENCOUNTER — Ambulatory Visit (INDEPENDENT_AMBULATORY_CARE_PROVIDER_SITE_OTHER): Payer: Medicaid Other | Admitting: *Deleted

## 2021-05-28 VITALS — BP 119/77 | HR 83

## 2021-05-28 DIAGNOSIS — R3915 Urgency of urination: Secondary | ICD-10-CM | POA: Diagnosis not present

## 2021-05-28 LAB — POCT URINALYSIS DIPSTICK

## 2021-05-28 MED ORDER — CIPROFLOXACIN HCL 500 MG PO TABS: 500.0000 mg | ORAL_TABLET | Freq: Two times a day (BID) | ORAL | 0 refills | Status: DC

## 2021-05-28 NOTE — Progress Notes (Signed)
Patient seen and assessed by nursing staff.  Agree with documentation and plan.  

## 2021-05-28 NOTE — Progress Notes (Signed)
SUBJECTIVE: Heather [name] is a 40 y.o. female who complains of urinary frequency, urgency and dysuria x 2 days, without flank pain, fever, chills, or abnormal vaginal discharge or bleeding.   OBJECTIVE: Appears well, in no apparent distress.  Vital signs are normal. Urine dipstick shows positive for RBC's and positive for leukocytes.    ASSESSMENT: Dysuria  PLAN: Treatment per orders.  Call or return to clinic prn if these symptoms worsen or fail to improve as anticipated.

## 2021-06-05 LAB — URINE CULTURE
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
## 2021-06-10 ENCOUNTER — Encounter: Payer: Self-pay | Admitting: Neurology

## 2021-06-10 ENCOUNTER — Telehealth (INDEPENDENT_AMBULATORY_CARE_PROVIDER_SITE_OTHER): Payer: Medicaid Other | Admitting: Neurology

## 2021-06-10 ENCOUNTER — Other Ambulatory Visit: Payer: Self-pay

## 2021-06-10 VITALS — Ht 61.0 in | Wt 150.0 lb

## 2021-06-10 DIAGNOSIS — F331 Major depressive disorder, recurrent, moderate: Secondary | ICD-10-CM

## 2021-06-10 DIAGNOSIS — R4189 Other symptoms and signs involving cognitive functions and awareness: Secondary | ICD-10-CM

## 2021-06-10 DIAGNOSIS — G43709 Chronic migraine without aura, not intractable, without status migrainosus: Secondary | ICD-10-CM

## 2021-06-10 DIAGNOSIS — R41840 Attention and concentration deficit: Secondary | ICD-10-CM

## 2021-06-10 MED ORDER — EMGALITY 120 MG/ML ~~LOC~~ SOAJ: 120.0000 mg | SUBCUTANEOUS | 5 refills | Status: DC

## 2021-06-10 MED ORDER — EMGALITY 120 MG/ML ~~LOC~~ SOAJ: 240.0000 mg | Freq: Once | SUBCUTANEOUS | 0 refills | Status: AC

## 2021-06-11 ENCOUNTER — Telehealth: Payer: Self-pay

## 2021-06-11 NOTE — Telephone Encounter (Signed)
New message   Kameshia Madruga Key: [id number] - Rx #: [id number] Need help? Call us at [phone]  Outcome Additional Information Required This drug is subject to a high cost review. The pharmacy needs to contact the appropriate support line for an override.  Drug Emgality 120MG /ML auto-injectors (migraine) Form [facility] Electronic [facility] Form [phone] NCPDP)

## 2021-07-10 DIAGNOSIS — J0110 Acute frontal sinusitis, unspecified: Secondary | ICD-10-CM | POA: Diagnosis not present

## 2021-07-13 ENCOUNTER — Ambulatory Visit: Payer: Medicaid Other | Admitting: Obstetrics & Gynecology

## 2021-07-19 DIAGNOSIS — M79671 Pain in right foot: Secondary | ICD-10-CM | POA: Diagnosis not present

## 2021-07-19 DIAGNOSIS — S93601A Unspecified sprain of right foot, initial encounter: Secondary | ICD-10-CM | POA: Diagnosis not present

## 2021-07-27 ENCOUNTER — Telehealth: Payer: Self-pay | Admitting: *Deleted

## 2021-07-27 MED ORDER — PHENAZOPYRIDINE HCL 200 MG PO TABS: 200.0000 mg | ORAL_TABLET | Freq: Three times a day (TID) | ORAL | 1 refills | Status: DC | PRN

## 2021-07-27 MED ORDER — CIPROFLOXACIN HCL 500 MG PO TABS: 500.0000 mg | ORAL_TABLET | Freq: Two times a day (BID) | ORAL | 0 refills | Status: DC

## 2021-07-27 NOTE — Telephone Encounter (Signed)
Pt called stating she is having UTI symptoms, and wanted to know if we could treat her. Her family has the flu and doesn't want to come in if she doesn't have to. Okay per Dr Shawnie Pons to treat her the same as we did back in September.

## 2021-08-11 ENCOUNTER — Ambulatory Visit: Payer: Medicaid Other | Admitting: Obstetrics & Gynecology

## 2021-09-16 ENCOUNTER — Ambulatory Visit: Payer: Medicaid Other | Admitting: Advanced Practice Midwife

## 2021-10-11 DIAGNOSIS — N390 Urinary tract infection, site not specified: Secondary | ICD-10-CM | POA: Diagnosis not present

## 2021-10-29 DIAGNOSIS — M62838 Other muscle spasm: Secondary | ICD-10-CM | POA: Diagnosis not present

## 2021-10-29 DIAGNOSIS — N390 Urinary tract infection, site not specified: Secondary | ICD-10-CM | POA: Diagnosis not present

## 2021-11-16 ENCOUNTER — Ambulatory Visit: Payer: Medicaid Other | Admitting: Adult Health

## 2021-12-10 DIAGNOSIS — J020 Streptococcal pharyngitis: Secondary | ICD-10-CM | POA: Diagnosis not present

## 2021-12-15 DIAGNOSIS — M542 Cervicalgia: Secondary | ICD-10-CM | POA: Diagnosis not present

## 2021-12-15 DIAGNOSIS — M62838 Other muscle spasm: Secondary | ICD-10-CM | POA: Diagnosis not present
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
## 2021-12-23 ENCOUNTER — Encounter: Payer: Self-pay | Admitting: Neurology

## 2021-12-23 ENCOUNTER — Ambulatory Visit: Payer: Medicaid Other | Admitting: Neurology

## 2021-12-23 DIAGNOSIS — Z029 Encounter for administrative examinations, unspecified: Secondary | ICD-10-CM

## 2022-02-17 ENCOUNTER — Ambulatory Visit: Payer: Medicaid Other | Admitting: Nurse Practitioner

## 2022-02-17 VITALS — BP 112/60 | HR 86 | Temp 97.9°F | Resp 12 | Ht 61.0 in | Wt 146.1 lb

## 2022-02-17 DIAGNOSIS — E559 Vitamin D deficiency, unspecified: Secondary | ICD-10-CM

## 2022-02-17 DIAGNOSIS — E538 Deficiency of other specified B group vitamins: Secondary | ICD-10-CM | POA: Diagnosis not present

## 2022-02-17 DIAGNOSIS — L989 Disorder of the skin and subcutaneous tissue, unspecified: Secondary | ICD-10-CM | POA: Diagnosis not present

## 2022-02-17 DIAGNOSIS — E519 Thiamine deficiency, unspecified: Secondary | ICD-10-CM

## 2022-02-17 DIAGNOSIS — R600 Localized edema: Secondary | ICD-10-CM | POA: Diagnosis not present

## 2022-02-17 DIAGNOSIS — Z62810 Personal history of physical and sexual abuse in childhood: Secondary | ICD-10-CM | POA: Diagnosis not present

## 2022-02-17 DIAGNOSIS — R42 Dizziness and giddiness: Secondary | ICD-10-CM

## 2022-02-17 DIAGNOSIS — F909 Attention-deficit hyperactivity disorder, unspecified type: Secondary | ICD-10-CM

## 2022-02-17 DIAGNOSIS — F331 Major depressive disorder, recurrent, moderate: Secondary | ICD-10-CM

## 2022-02-17 LAB — CBC
HCT: 42.7 % (ref 36.0–46.0)
Hemoglobin: 14.5 g/dL (ref 12.0–15.0)
MCHC: 33.9 g/dL (ref 30.0–36.0)
MCV: 89.3 fl (ref 78.0–100.0)
Platelets: 383 10*3/uL (ref 150.0–400.0)
RBC: 4.79 Mil/uL (ref 3.87–5.11)
RDW: 13.5 % (ref 11.5–15.5)
WBC: 7.1 10*3/uL (ref 4.0–10.5)

## 2022-02-17 LAB — COMPREHENSIVE METABOLIC PANEL
ALT: 10 U/L (ref 0–35)
AST: 12 U/L (ref 0–37)
Albumin: 4.3 g/dL (ref 3.5–5.2)
Alkaline Phosphatase: 60 U/L (ref 39–117)
BUN: 9 mg/dL (ref 6–23)
CO2: 31 mEq/L (ref 19–32)
Calcium: 9.6 mg/dL (ref 8.4–10.5)
Chloride: 101 mEq/L (ref 96–112)
Creatinine, Ser: 0.65 mg/dL (ref 0.40–1.20)
GFR: 111.32 mL/min (ref >60.00)
Glucose, Bld: 90 mg/dL (ref 70–99)
Potassium: 3.8 mEq/L (ref 3.5–5.1)
Sodium: 139 mEq/L (ref 135–145)
Total Bilirubin: 0.5 mg/dL (ref 0.2–1.2)
Total Protein: 6.7 g/dL (ref 6.0–8.3)

## 2022-02-17 LAB — IBC + FERRITIN
Ferritin: 23.6 ng/mL (ref 10.0–291.0)
Iron: 98 ug/dL (ref 42–145)
Saturation Ratios: 28.5 % (ref 20.0–50.0)
TIBC: 344.4 ug/dL (ref 250.0–450.0)
Transferrin: 246 mg/dL (ref 212.0–360.0)

## 2022-02-17 LAB — BRAIN NATRIURETIC PEPTIDE: Pro B Natriuretic peptide (BNP): 8 pg/mL (ref 0.0–100.0)

## 2022-02-17 LAB — FOLATE: Folate: 16.9 ng/mL (ref >5.9)

## 2022-02-17 LAB — VITAMIN B12: Vitamin B-12: 227 pg/mL (ref 211–911)

## 2022-02-17 LAB — TSH: TSH: 1.4 u[IU]/mL (ref 0.35–5.50)

## 2022-02-17 NOTE — Assessment & Plan Note (Signed)
History of the same.  Pending lab results 

## 2022-02-17 NOTE — Patient Instructions (Signed)
Nice to see you today I will be in touch with the labs once I have them Follow up as scheduled with me, sooner if you need it
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
## 2022-02-17 NOTE — Assessment & Plan Note (Signed)
Right-sided over the past 3 days of ankle and foot.  No edema noted in office today.  We will check basic labs.  Patient states that friend to give her hydrochlorothiazide to take.  She does work where she is standing on her feet for extended periods of time likely dependent edema although patient did note it did not decrease with elevation.  Patient has no history of DVT

## 2022-02-17 NOTE — Assessment & Plan Note (Signed)
History of the same.  Pending lab results

## 2022-02-17 NOTE — Assessment & Plan Note (Signed)
PHQ-9 and GAD-7 administered in office.  Patient denies HI/SI/AVH.  She was followed by psychiatry in the past but due to change in insurance that no longer excepted her policy.  We will refer her to psychiatry since she does have MDD, PTSD, AD HD

## 2022-02-17 NOTE — Assessment & Plan Note (Signed)
Ambiguous and nonspecific.  Neurological exam intact during office visit.  Patient states has had this happen in the past with low B12 levels and anemia.  Pending lab results.  Patient has not tried any over-the-counter medications such as meclizine.  Does not sound like vertigo as it is more of a lightheadedness versus dizziness.

## 2022-02-24 LAB — VITAMIN B1: Vitamin B1 (Thiamine): 8 nmol/L (ref 8–30)

## 2022-03-10 ENCOUNTER — Encounter: Payer: Self-pay | Admitting: Nurse Practitioner

## 2022-03-10 ENCOUNTER — Ambulatory Visit: Payer: Medicaid Other | Admitting: Nurse Practitioner

## 2022-03-10 VITALS — BP 116/66 | HR 78 | Temp 98.6°F | Resp 16 | Ht 61.0 in | Wt 149.2 lb

## 2022-03-10 DIAGNOSIS — F411 Generalized anxiety disorder: Secondary | ICD-10-CM | POA: Diagnosis not present

## 2022-03-10 DIAGNOSIS — E538 Deficiency of other specified B group vitamins: Secondary | ICD-10-CM | POA: Diagnosis not present

## 2022-03-10 DIAGNOSIS — R600 Localized edema: Secondary | ICD-10-CM

## 2022-03-10 DIAGNOSIS — Z62810 Personal history of physical and sexual abuse in childhood: Secondary | ICD-10-CM

## 2022-03-10 DIAGNOSIS — F909 Attention-deficit hyperactivity disorder, unspecified type: Secondary | ICD-10-CM

## 2022-03-10 DIAGNOSIS — F331 Major depressive disorder, recurrent, moderate: Secondary | ICD-10-CM | POA: Diagnosis not present

## 2022-03-10 MED ORDER — AMPHETAMINE-DEXTROAMPHET ER 10 MG PO CP24: 10.0000 mg | ORAL_CAPSULE | Freq: Two times a day (BID) | ORAL | 0 refills | Status: DC

## 2022-03-10 MED ORDER — VENLAFAXINE HCL ER 37.5 MG PO CP24: 37.5000 mg | ORAL_CAPSULE | Freq: Every day | ORAL | 1 refills | Status: DC

## 2022-03-10 MED ORDER — CLONAZEPAM 0.5 MG PO TABS: 0.2500 mg | ORAL_TABLET | Freq: Every day | ORAL | 0 refills | Status: DC | PRN

## 2022-03-10 MED ORDER — CYANOCOBALAMIN 1000 MCG/ML IJ SOLN
1000.0000 ug | Freq: Once | INTRAMUSCULAR | Status: AC
  Administered 2022-03-10: 1000 ug via INTRAMUSCULAR

## 2022-03-10 MED ORDER — CYANOCOBALAMIN 1000 MCG/ML IJ SOLN: INTRAMUSCULAR | 3 refills | Status: DC

## 2022-03-10 NOTE — Assessment & Plan Note (Signed)
Patient has been evaluated by psychology and had appropriate diagnosis was seeing psychiatry but given insurance changes and Any changes then, longer see her.  Did check PDMP patient was on Adderall 10 mg XR twice daily that worked the best we will continue that today.

## 2022-03-10 NOTE — Patient Instructions (Signed)
Nice to see you today I sent in the scripts Follow up with me in 6 weeks, sooner if you need me

## 2022-03-10 NOTE — Assessment & Plan Note (Signed)
Currently working to find therapy.  Did refer but they were unable to accept patient at current time will refer to a different clinic and see if they can accept the patient

## 2022-03-10 NOTE — Assessment & Plan Note (Signed)
Patient states this seems of gotten better since she has not been on her feet for 10 hours a day.

## 2022-03-10 NOTE — Assessment & Plan Note (Signed)
Given low normal and history of deficiency we will administer 1000 mcg IM today and patient can do for the next 3 months at home as she is done it before.  We will need lab visit after that point
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
## 2022-03-10 NOTE — Assessment & Plan Note (Signed)
We will start patient on venlafaxine 37.5 mg.  Follow-up in 6 weeks

## 2022-03-10 NOTE — Assessment & Plan Note (Signed)
Patient has been on a myriad of treatments in the past without great success.  States does not take meds daily medication.  We did discuss she will try venlafaxine 37.5 mg daily.  We will write a very short course of Klonopin 0.5 mg tablets she can use 1 daily as needed.  Told her this cannot be a consistent thing nor an everyday thing, she acknowledged.  No HI/SI/AVH.  Follow-up in 6 weeks

## 2022-03-14 ENCOUNTER — Telehealth: Payer: Self-pay

## 2022-03-14 DIAGNOSIS — F909 Attention-deficit hyperactivity disorder, unspecified type: Secondary | ICD-10-CM

## 2022-03-14 NOTE — Telephone Encounter (Signed)
Patient is calling in stating that she wanted a call back as she and Cable discussed going back on Adderall. Ednamae wanted to know if she can go on INR as the XR's are having a greater shortage then the INR.

## 2022-03-14 NOTE — Telephone Encounter (Signed)
Can we verify this is out of stock and make sure the other is in stock

## 2022-03-15 NOTE — Telephone Encounter (Signed)
Spoke with [facility] pharmacist and was advised that they do have more of the Adderall IR than XR 10 mg and 15 mg-they have no word on when 20 mg or 30 mg of both when it would be available. It is a national back order issue overall for all the strength and how fast they get this in. She could not say if they are able to get IR easier than XR it just depends but on hand they do have IR available 10 mg and 15 mg doses about 100 or more pills and just a handful of XR.

## 2022-03-16 ENCOUNTER — Encounter: Payer: Self-pay | Admitting: *Deleted

## 2022-03-16 MED ORDER — AMPHETAMINE-DEXTROAMPHETAMINE 10 MG PO TABS: 10.0000 mg | ORAL_TABLET | Freq: Two times a day (BID) | ORAL | 0 refills | Status: DC

## 2022-03-16 NOTE — Addendum Note (Signed)
Addended by: Eden Emms on: 03/16/2022 07:30 AM   Modules accepted: Orders

## 2022-03-16 NOTE — Telephone Encounter (Signed)
Patient advised. Spoke with [facility] pharmacist and confirmed that XR was d/c

## 2022-03-16 NOTE — Telephone Encounter (Signed)
Can we call and cancel the script for the 10mg  XR and I will send in a script for the 10mg  IR

## 2022-03-18 ENCOUNTER — Other Ambulatory Visit: Payer: Self-pay | Admitting: Nurse Practitioner

## 2022-03-18 DIAGNOSIS — F331 Major depressive disorder, recurrent, moderate: Secondary | ICD-10-CM

## 2022-03-18 NOTE — Progress Notes (Signed)
Orders only

## 2022-04-08 ENCOUNTER — Other Ambulatory Visit: Payer: Self-pay | Admitting: Nurse Practitioner

## 2022-04-08 ENCOUNTER — Telehealth: Payer: Self-pay | Admitting: Nurse Practitioner

## 2022-04-08 DIAGNOSIS — F411 Generalized anxiety disorder: Secondary | ICD-10-CM

## 2022-04-08 MED ORDER — CLONAZEPAM 0.5 MG PO TABS: 0.2500 mg | ORAL_TABLET | Freq: Every day | ORAL | 0 refills | Status: DC | PRN

## 2022-04-08 NOTE — Telephone Encounter (Signed)
  Encourage patient to contact the pharmacy for refills or they can request refills through [facility]  Did the patient contact the pharmacy: Yes   LAST APPOINTMENT DATE: 03/10/22  NEXT APPOINTMENT DATE: 04/21/22  MEDICATION: clonazePAM (KLONOPIN) 0.5 MG tablet  Is the patient out of medication? No  If not, how much is left? 1 left  PHARMACY: [id number] - [place], [place] - [address]  Let patient know to contact pharmacy at the end of the day to make sure medication is ready.  Please notify patient to allow 48-72 hours to process

## 2022-04-08 NOTE — Telephone Encounter (Signed)
RX refilled  

## 2022-04-18 ENCOUNTER — Telehealth: Payer: Self-pay | Admitting: Nurse Practitioner

## 2022-04-18 ENCOUNTER — Telehealth: Payer: Self-pay

## 2022-04-18 ENCOUNTER — Other Ambulatory Visit: Payer: Self-pay | Admitting: Nurse Practitioner

## 2022-04-18 DIAGNOSIS — F909 Attention-deficit hyperactivity disorder, unspecified type: Secondary | ICD-10-CM

## 2022-04-18 MED ORDER — AMPHETAMINE-DEXTROAMPHETAMINE 10 MG PO TABS: 10.0000 mg | ORAL_TABLET | Freq: Two times a day (BID) | ORAL | 0 refills | Status: DC

## 2022-04-18 NOTE — Telephone Encounter (Signed)
Name of Medication: Adderall 10 mg 2 times daily Name of Pharmacy: [facility] Last [place] or Written Date and Quantity: 7/12 #60 with 0 refill- Last Office Visit and Type: 03/10/22 Next Office Visit and Type: 04/21/22 f/u- She doesn't have enough to last until Monday.  Last Controlled Substance Agreement Date: none Last UDS: none

## 2022-04-18 NOTE — Telephone Encounter (Signed)
Spoke with patient, she was following up on medication refill. Patient advised that it was done at 3:18 pm

## 2022-04-18 NOTE — Telephone Encounter (Signed)
See other phone note

## 2022-04-18 NOTE — Telephone Encounter (Signed)
Patient called in with questions about her amphetamine-dextroamphetamine (ADDERALL) 10 MG tablet , he would like a phone call.

## 2022-04-18 NOTE — Telephone Encounter (Signed)
  Encourage patient to contact the pharmacy for refills or they can request refills through [facility]  Did the patient contact the pharmacy:  n   LAST APPOINTMENT DATE: 04/08/22  NEXT APPOINTMENT DATE:  MEDICATION:amphetamine-dextroamphetamine (ADDERALL) 10 MG tablet  Is the patient out of medication? yes  If not, how much is left?  Is this a 90 day supply:   PHARMACY: [id number] - [address] [place] - [address] Phone:  [phone]  Fax:  [phone]      Let patient know to contact pharmacy at the end of the day to make sure medication is ready.  Please notify patient to allow 48-72 hours to process

## 2022-04-18 NOTE — Telephone Encounter (Signed)
Patient advised.

## 2022-04-18 NOTE — Telephone Encounter (Signed)
[facility] Night - Client TELEPHONE ADVICE RECORD Access[employer]urse Patient Name: Heather [name] Gender: Female DOB: Apr 25, 1981 Age: 41 Y 17 D Return Phone Number: [phone] (Primary) Address: City/ State/ Zip: [place]  [address] Client [facility] Night - Client Client Site  [facility] [place] - Night Provider AA - PHYSICIAN, Crissie Figures- MD Contact Type Call Who Is Calling Patient / Member / Family / Caregiver Call Type Triage / Clinical Relationship To Patient Self Return Phone Number [phone] (Primary) Chief Complaint Prescription Refill or Medication Request (non symptomatic) Reason for Call Medication Question / Request Initial Comment Caller needs a refill on her adderrall. She doesn't have enough to last until Monday. No symptoms. Translation No Nurse Assessment Nurse: Valentina Lucks, RN, Tem[facility] Hoist Date/Time Lamount Cohen Time): 04/15/2022 7:06:19 PM Please select the assessment type ---Refill Additional Documentation ---Caller needs a refill on Adderall. Caller will run out before Monday. Caller states no sx at this time. Caller states she will contact PCP office on Monday. Does the patient have enough medication to last until the office opens? ---No Nurse: Valentina Lucks, RN, Tempie Hoist Date/Time Lamount Cohen Time): 04/15/2022 7:08:22 PM Confirm and document reason for call. If symptomatic, describe symptoms. ---Caller needs refill. Does the patient have any new or worsening symptoms? ---No Disp. Time Lamount Cohen Time) Disposition Final User 04/15/2022 6:22:11 PM Send To Nurse Shawnee Knapp, RN, [facility] 04/15/2022 7:08:46 PM Clinical Call Yes Valentina Lucks, RN, Tempie Hoist Final Disposition 04/15/2022 7:08:46 PM Clinical Call Yes Valentina Lucks, RN, Tempie Hoist

## 2022-04-21 ENCOUNTER — Encounter: Payer: Self-pay | Admitting: Nurse Practitioner

## 2022-04-21 ENCOUNTER — Ambulatory Visit: Payer: Medicaid Other | Admitting: Nurse Practitioner

## 2022-04-21 VITALS — BP 120/68 | HR 105 | Temp 97.0°F | Resp 10 | Ht 61.0 in | Wt 149.5 lb

## 2022-04-21 DIAGNOSIS — F909 Attention-deficit hyperactivity disorder, unspecified type: Secondary | ICD-10-CM

## 2022-04-21 DIAGNOSIS — F331 Major depressive disorder, recurrent, moderate: Secondary | ICD-10-CM

## 2022-04-21 DIAGNOSIS — F411 Generalized anxiety disorder: Secondary | ICD-10-CM | POA: Diagnosis not present

## 2022-04-21 MED ORDER — VENLAFAXINE HCL ER 75 MG PO CP24: 75.0000 mg | ORAL_CAPSULE | Freq: Every day | ORAL | 1 refills | Status: DC

## 2022-04-21 NOTE — Assessment & Plan Note (Signed)
We will continue with the Adderall 10 mg IR twice daily currently.

## 2022-04-21 NOTE — Patient Instructions (Signed)
Nice to see you today I have increased the effexor 75. You can take 2 capsules of the 37.5mg  until you finish your supply I want to see you in 4-6 months for a physical My chart me in 1 month to see how the dose increase is doing

## 2022-04-21 NOTE — Assessment & Plan Note (Signed)
Improvement in PHQ-9 and GAD-7 scores.  We will titrate venlafaxine 37.5 mg to venlafaxine 75 mg daily.

## 2022-04-21 NOTE — Assessment & Plan Note (Addendum)
For likely made some improvement with the PHQ-9 and GAD-7 scores.  Patient has had a lot of stress as of late and anxiety has been out of control over the past couple days.  We will continue Klonopin as needed.  We will increase Effexor from 37 and half milligrams to 75 mg.  Patient is allowed to take 2 capsules of her 37.5 mg until she uses up her supply.  She will reach out in a month to MyChart for status update

## 2022-04-21 NOTE — Progress Notes (Signed)
Established Patient Office Visit  Subjective   Patient ID: Heather [name], female    DOB: 1981/04/26  Age: 41 y.o. MRN: [id number]  Chief Complaint  Patient presents with   Follow-up    HPI   MDD: Patient was seen on 03/10/2022 and we had a discussion and she has tried many medications in the past without good relief. We started her on effoxor 37.5mg  and she is here for a recheck  GAD: patient currently on klonopin 0.5mg  PRN.  Anxiety seem to be can controlled more so than previously since the last few days.  States that for the past couple days that her anxiety has been worse. Her mother lives with her sister. Her mother came with her to the appointment today, she is in the car. She is a little uncomfortable being alone with her.   ADD/ADHD: Patient was placed on adderrall 10mg  IR BID.  Seems to be helping but unsure if she needs a dose increase.  We will hold off at this current juncture     04/21/2022   10:49 AM 02/17/2022   10:46 AM 06/05/2020   12:41 PM  PHQ9 SCORE ONLY  PHQ-9 Total Score 7 13 5         04/21/2022   10:49 AM 02/17/2022   10:46 AM 06/05/2020   12:41 PM  GAD 7 : Generalized Anxiety Score  Nervous, Anxious, on Edge 1 3 3   Control/stop worrying 0 3 2  Worry too much - different things 1 2 3   Trouble relaxing 1 1 2   Restless 0 1 1  Easily annoyed or irritable 1 1 2   Afraid - awful might happen 0 1 1  Total GAD 7 Score 4 12 14   Anxiety Difficulty Somewhat difficult Somewhat difficult          Review of Systems  Constitutional:  Negative for chills and fever.  Respiratory:  Negative for shortness of breath.   Cardiovascular:  Negative for chest pain and palpitations.  Psychiatric/Behavioral:  Negative for hallucinations and suicidal ideas.       Objective:     BP 120/68   Pulse (!) 105   Temp (!) 97 F (36.1 C)   Resp 10   Ht 5\' 1"  (1.549 m)   Wt 149 lb 8 oz (67.8 kg)   LMP 04/01/2022   SpO2 98%   BMI 28.25 kg/m    Physical  Exam Vitals and nursing note reviewed.  Constitutional:      Appearance: Normal appearance.  Cardiovascular:     Rate and Rhythm: Normal rate and regular rhythm.     Heart sounds: Normal heart sounds.  Pulmonary:     Effort: Pulmonary effort is normal.     Breath sounds: Normal breath sounds.  Neurological:     Mental Status: She is alert.  Psychiatric:        Mood and Affect: Mood normal.        Behavior: Behavior normal.        Judgment: Judgment normal.      No results found for any visits on 04/21/22.    The ASCVD Risk score (Arnett DK, et al., 2019) failed to calculate for the following reasons:   The 2019 ASCVD risk score is only valid for ages 2 to 60    Assessment & Plan:   Problem List Items Addressed This Visit       Other   Major depressive disorder - Primary    Improvement in PHQ-9  and GAD-7 scores.  We will titrate venlafaxine 37.5 mg to venlafaxine 75 mg daily.      Relevant Medications   venlafaxine XR (EFFEXOR XR) 75 MG 24 hr capsule   Generalized anxiety disorder    For likely made some improvement with the PHQ-9 and GAD-7 scores.  Patient has had a lot of stress as of late and anxiety has been out of control over the past couple days.  We will continue Klonopin as needed.  We will increase Effexor from 37 and half milligrams to 75 mg.  Patient is allowed to take 2 capsules of her 37.5 mg until she uses up her supply.  She will reach out in a month to MyChart for status update      Relevant Medications   venlafaxine XR (EFFEXOR XR) 75 MG 24 hr capsule   ADHD (attention deficit hyperactivity disorder)    We will continue with the Adderall 10 mg IR twice daily currently.       Return in about 5 months (around 09/21/2022) for CPE and labs.    Audria Nine, NP

## 2022-04-26 ENCOUNTER — Telehealth: Payer: Self-pay | Admitting: Nurse Practitioner

## 2022-04-26 NOTE — Telephone Encounter (Signed)
Patient states she has increased Effexor to 2 tablets daily since 04/22/22 and she can not tolerate it, she is much more sweaty, feels so hot, everything is increased. She had these symptoms on 37.5 mg daily but not as severe. She did not have issues with been sweaty and hot before been on Effexor. I asked patient if she wants to stay with 37.5 mg 1 daily for now or change it all together. Patient is willing to continue taking just one daily. Patient said she discussed maybe changing Adderall dose instead? Would like recommendations on whichever provider thinks is best route.

## 2022-04-26 NOTE — Telephone Encounter (Signed)
Stop taking the 75mg  effexor. Continue taking effexor 37.5mg  daily. She can take the adderall that she has and we can increase the next dose increase is 15mg  twice a day. Let me know her thoughts

## 2022-04-26 NOTE — Telephone Encounter (Signed)
Patient advised. Patient agreed with the plan.

## 2022-04-26 NOTE — Telephone Encounter (Signed)
Pt called in requesting a call back regarding the RX venlafaxine XR (EFFEXOR XR) 75 MG 24 hr capsule . Marland Kitchen Pt has concerns about dosage . Please Advise [phone]

## 2022-05-03 ENCOUNTER — Ambulatory Visit
Admission: RE | Admit: 2022-05-03 | Discharge: 2022-05-03 | Discharge status: Against Medical Advice | Payer: Medicaid Other | Source: Ambulatory Visit | Attending: Nurse Practitioner | Admitting: Nurse Practitioner

## 2022-05-03 ENCOUNTER — Telehealth: Payer: Medicaid Other | Admitting: Nurse Practitioner

## 2022-05-03 NOTE — Telephone Encounter (Signed)
Patient called in stating that she experienced an injury to her nose on Friday. She is still experiencing headache everyday. She wasn't sure if she needed to come her to be seen or go to ENT. I sent her to triage due to the headaches that she experiencing.

## 2022-05-03 NOTE — Telephone Encounter (Signed)
Patient will need an appointment here. ENT offices are booking out a month or more for appointments.

## 2022-05-03 NOTE — Telephone Encounter (Signed)
Patient called in after going to Urgent care after being scheduled an appointment from triage nurse. Patient went to urgent care,and they pretty much told her that she does not need to be seen there and do not need an xray,but however does need an CT scan done. So she is asking for an order for a CT scan to be sent for her.

## 2022-05-03 NOTE — Telephone Encounter (Signed)
Danika RN with access nurse calling; pt was walking out of store and there was a ladder hanging over truck bed and pt ran into it. Pt did not lose consciousness.pt did not have a nose bleed but did have bruise over bridge of nose and the bruise is gone now but nose is still swollen and pt has continuous H/A of pain level 8 - 9 and when takes med pain level is 2 - 3. Pt said the H/A is across forehead. Pt does not have N&V but has had blurred vision. No available appts at [facility] this afternoon but I scheduled pt an appt at [facility] 05/03/22 at 4:00 PM. Pt refuses ED precautions and going to the ED. Pt said she has to pick up her children at 2:30 and 3:15 and then pt will go to UC appt. Pt said if she worsens a lot she will go to ED. Sending note to Audria Nine FNP and Anastasiya CMA.    [facility] - Client TELEPHONE ADVICE RECORD AccessNurse Patient Name: Heather [name] Gender: Female DOB: 01-30-1981 Age: 41 Y 1 M 4 D Return Phone Number: [phone] (Primary) Address: City/ State/ Zip: [place]  [address] Client [facility] - Client Client Site [facility] - Day Provider Audria Nine- NP Contact Type Call Who Is Calling Patient / Member / Family / Caregiver Call Type Triage / Clinical Relationship To Patient Self Return Phone Number [phone] (Primary) Chief Complaint Headache Reason for Call Symptomatic / Request for Health Information Initial Comment Caller states they had a nose injury and it is bruised and is now having headaches. GOTO Facility Not Listed [facility] Translation No Nurse Assessment Nurse: Doree Barthel, RN, Danica Date/Time (Eastern Time): 05/03/2022 11:28:21 AM Confirm and document reason for call. If symptomatic, describe symptoms. ---Caller states she was walking out of a store and was not paying attention and walked into a ladder hanging out of a truck. hit the bridge  of her nose and was bleeding and had bruising and swelling. Happened on Friday 8/25. Still a little swollen but bruising gone and having headaches. pain is 2/10 with medicine, without it is 8/10 Does the patient have any new or worsening symptoms? ---Yes Will a triage be completed? ---Yes Related visit to physician within the last 2 weeks? ---No Does the PT have any chronic conditions? (i.e. diabetes, asthma, this includes High risk factors for pregnancy, etc.) ---No Is the patient pregnant or possibly pregnant? (Ask all females between the ages of 95-55) ---No Is this a behavioral health or substance abuse call? ---No Guidelines Guideline Title Affirmed Question Affirmed Notes Nurse Date/Time (Eastern Time) Nose Injury [1] Last tetanus shot > 10 years ago AND [2] CLEAN cut or scrape (e.g., object Bringas, RN, Danica 05/03/2022 11:31:56 AM PLEASE NOTE: All timestamps contained within this report are represented as [place] Standard Time. CONFIDENTIALTY NOTICE: This fax transmission is intended only for the addressee. It contains information that is legally privileged, confidential or otherwise protected from use or disclosure. If you are not the intended recipient, you are strictly prohibited from reviewing, disclosing, copying using or disseminating any of this information or taking any action in reliance on or regarding this information. If you have received this fax in error, please notify us immediately by telephone so that we can arrange for its return to [place]. Phone: [phone], Toll-Free: [phone], Fax: [phone] Page: 2 of 2 Call Id: [id number] Guidelines  Guideline Title Affirmed Question Affirmed Notes Nurse Date/Time (Eastern Time) AND skin were clean) Disp. Time Lamount Cohen Time) Disposition Final User 05/03/2022 11:55:16 AM SEE PCP WITHIN 3 DAYS Yes Bringas, RN, Danica Final Disposition 05/03/2022 11:55:16 AM SEE PCP WITHIN 3 DAYS Yes Bringas, RN, Danica Caller  Disagree/Comply Comply Caller Understands Yes PreDisposition Call Doctor Care Advice Given Per Guideline SEE PCP WITHIN 3 DAYS: * You need to be seen within 2 or 3 days. * PCP VISIT: Call your doctor (or NP/PA) during regular office hours and make an appointment. A clinic or urgent care center are good places to go for care if your doctor's office is closed or you can't get an appointment. NOTE: If office will be open tomorrow, tell caller to call then, not in 3 days. USE A COLD PACK FOR PAIN, SWELLING, OR BRUISING: * Put a cold pack or an ice bag (wrapped in a moist towel) on the area for 20 minutes. * This will help decrease pain, swelling, and bruising. CALL BACK IF: * You become worse CARE ADVICE given per Nose Injury (Adult) guideline. Referrals GO TO FACILITY OTHER - SPECIF

## 2022-05-03 NOTE — Telephone Encounter (Signed)
Spoke with patient. Patient needs to be seen prior to been able to order CT scan, have to get pre authorization and will need notes and an exam for that. Patient is not going to ER. She has ER precautions. Patient was trying to come tomorrow right after dermatology appointment but we did not have anything open in the office until 12 pm. Will keep an eye for cancellations. Patient aware.

## 2022-05-04 ENCOUNTER — Ambulatory Visit: Payer: Medicaid Other | Admitting: Nurse Practitioner

## 2022-05-04 ENCOUNTER — Telehealth: Payer: Self-pay | Admitting: Nurse Practitioner

## 2022-05-04 VITALS — BP 126/78 | HR 83 | Temp 96.4°F | Resp 12 | Ht 61.0 in | Wt 155.0 lb

## 2022-05-04 DIAGNOSIS — S0993XA Unspecified injury of face, initial encounter: Secondary | ICD-10-CM | POA: Insufficient documentation

## 2022-05-04 DIAGNOSIS — D2272 Melanocytic nevi of left lower limb, including hip: Secondary | ICD-10-CM | POA: Diagnosis not present

## 2022-05-04 DIAGNOSIS — D225 Melanocytic nevi of trunk: Secondary | ICD-10-CM | POA: Diagnosis not present

## 2022-05-04 DIAGNOSIS — F0781 Postconcussional syndrome: Secondary | ICD-10-CM | POA: Insufficient documentation

## 2022-05-04 DIAGNOSIS — L811 Chloasma: Secondary | ICD-10-CM | POA: Diagnosis not present

## 2022-05-04 DIAGNOSIS — D485 Neoplasm of uncertain behavior of skin: Secondary | ICD-10-CM | POA: Diagnosis not present

## 2022-05-04 DIAGNOSIS — S0990XA Unspecified injury of head, initial encounter: Secondary | ICD-10-CM | POA: Insufficient documentation

## 2022-05-04 DIAGNOSIS — D2262 Melanocytic nevi of left upper limb, including shoulder: Secondary | ICD-10-CM | POA: Diagnosis not present

## 2022-05-04 DIAGNOSIS — D2372 Other benign neoplasm of skin of left lower limb, including hip: Secondary | ICD-10-CM | POA: Diagnosis not present

## 2022-05-04 DIAGNOSIS — L821 Other seborrheic keratosis: Secondary | ICD-10-CM | POA: Diagnosis not present

## 2022-05-04 DIAGNOSIS — D2261 Melanocytic nevi of right upper limb, including shoulder: Secondary | ICD-10-CM | POA: Diagnosis not present

## 2022-05-04 DIAGNOSIS — R208 Other disturbances of skin sensation: Secondary | ICD-10-CM | POA: Diagnosis not present

## 2022-05-04 MED ORDER — IBUPROFEN 800 MG PO TABS: 800.0000 mg | ORAL_TABLET | Freq: Three times a day (TID) | ORAL | 0 refills | Status: DC | PRN

## 2022-05-04 NOTE — Telephone Encounter (Signed)
Left message for [facility] office asking them status and if patient can be scheduled to please call patient to do so and if there is an issue with the referral to let us know

## 2022-05-04 NOTE — Assessment & Plan Note (Signed)
Patient antibiotics recommendations.  Denies LOC.  The neurological exam intact in office today.  We will have her take ibuprofen 800 mg 3 times daily with food for the next 5 days.  Patient will stop using Goody's powder.  Follow-up if no improvement present has improved since onset

## 2022-05-04 NOTE — Telephone Encounter (Signed)
Noted will evaluate her in office  

## 2022-05-04 NOTE — Progress Notes (Signed)
Acute Office Visit  Subjective:     Patient ID: Heather [name], female    DOB: [birth year], 41 y.o.   MRN: [id number]  Chief Complaint  Patient presents with   Facial Injury    On 04/29/22 pt was walking out of store and there was a ladder hanging over truck bed and pt ran into it. Hit upper nose bridge. Pt did not lose consciousness. Headaches present, swelling present on the side of the nose. Bruising better.      Patient is in today for facial injury  States that she was wlaking and there was a ladder on the truck that she could not see. She ran into the ladder striking her nose. She did not lose concencious. States that she did have a little gash. States that she went to [facility] and was told that she needed a CT.  States that she has been having headaches that Marlin Canary will abate and then it will come back. States that she will have some blurred vision and had some nausea last night with no vomting. Headaches have not improved     Review of Systems  Constitutional:  Negative for chills and fever.  HENT:  Negative for nosebleeds.   Eyes:  Positive for blurred vision (more in the afternoon).  Gastrointestinal:  Positive for nausea.  Neurological:  Positive for tingling, weakness and headaches.        Objective:    BP 126/78   Pulse 83   Temp (!) 96.4 F (35.8 C)   Resp 12   Ht 5\' 1"  (1.549 m)   Wt 155 lb (70.3 kg)   LMP 04/01/2022   SpO2 98%   BMI 29.29 kg/m  BP Readings from Last 3 Encounters:  05/04/22 126/78  04/21/22 120/68  03/10/22 116/66   Wt Readings from Last 3 Encounters:  05/04/22 155 lb (70.3 kg)  04/21/22 149 lb 8 oz (67.8 kg)  03/10/22 149 lb 3 oz (67.7 kg)      Physical Exam Vitals and nursing note reviewed.  Constitutional:      Appearance: Normal appearance.  HENT:     Head:      Comments: Ecchymosis noticed bilaterally    Right Ear: Ear canal and external ear normal.     Left Ear: Tympanic membrane, ear canal and external ear normal.      Ears:     Comments: Scarring R tm    Nose: Signs of injury and nasal tenderness present. No nasal deformity.     Right Nostril: No septal hematoma or occlusion.     Left Nostril: No septal hematoma or occlusion.     Mouth/Throat:     Mouth: Mucous membranes are moist.     Pharynx: Oropharynx is clear.  Eyes:     Extraocular Movements: Extraocular movements intact.     Pupils: Pupils are equal, round, and reactive to light.  Cardiovascular:     Rate and Rhythm: Normal rate and regular rhythm.  Musculoskeletal:        General: No tenderness.  Lymphadenopathy:     Cervical: No cervical adenopathy.  Neurological:     General: No focal deficit present.     Mental Status: She is alert.     Cranial Nerves: Cranial nerves 2-12 are intact.     Motor: Motor function is intact. No weakness.     Coordination: Finger-Nose-Finger Test normal.     Gait: Gait is intact.     Deep Tendon Reflexes:  Reflex Scores:      Bicep reflexes are 2+ on the right side and 2+ on the left side.      Patellar reflexes are 2+ on the right side and 2+ on the left side.    Comments: Bilateral upper and lower extremity strength 5/5     No results found for any visits on 05/04/22.      Assessment & Plan:   Problem List Items Addressed This Visit       Nervous and Auditory   Post concussion syndrome    Patient experiencing signs symptoms of postconcussive syndrome.  Patient information will be seen immediately.  Follow-up with improvement        Other   Injury of face - Primary    Patient antibiotics recommendations.  Denies LOC.  The neurological exam intact in office today.  We will have her take ibuprofen 800 mg 3 times daily with food for the next 5 days.  Patient will stop using Goody's powder.  Follow-up if no improvement present has improved since onset        Relevant Medications   ibuprofen (ADVIL) 800 MG tablet    Meds ordered this encounter  Medications   ibuprofen (ADVIL) 800  MG tablet    Sig: Take 1 tablet (800 mg total) by mouth every 8 (eight) hours as needed for moderate pain.    Dispense:  15 tablet    Refill:  0    Order Specific Question:   Supervising Provider    Answer:   Roxy Manns A [[id number]]    Return if symptoms worsen or fail to improve, for As scheduled for CPE.  Audria Nine, NP

## 2022-05-04 NOTE — Patient Instructions (Signed)
Nice to see you today Keep me updated on your symptoms Stop taking the goody's I will send you some ibuprofen to take for the next 5 days Follow up if no improvement  Physical  in Jan 2024

## 2022-05-04 NOTE — Telephone Encounter (Signed)
Can we check on the patients therapy referral please

## 2022-05-04 NOTE — Assessment & Plan Note (Signed)
Patient experiencing signs symptoms of postconcussive syndrome.  Patient information will be seen immediately.  Follow-up with improvement

## 2022-05-10 ENCOUNTER — Telehealth: Payer: Self-pay | Admitting: Nurse Practitioner

## 2022-05-10 NOTE — Telephone Encounter (Signed)
  Encourage patient to contact the pharmacy for refills or they can request refills through [facility]  Did the patient contact the pharmacy:  y   LAST APPOINTMENT DATE:  Please schedule appointment if longer than 1 year  NEXT APPOINTMENT DATE:  MEDICATION:clonazePAM (KLONOPIN) 0.5 MG tablet amphetamine-dextroamphetamine (ADDERALL) 10 MG tablet (suppose to be 15 mg) Is the patient out of medication? Out of klonopin  If not, how much is left?  Is this a 90 day supply:   PHARMACY: [id number] [address]  - [address] Phone:  [phone]  Fax:  [phone]      Let patient know to contact pharmacy at the end of the day to make sure medication is ready.  Please notify patient to allow 48-72 hours to process

## 2022-05-11 ENCOUNTER — Other Ambulatory Visit: Payer: Self-pay | Admitting: Nurse Practitioner

## 2022-05-11 DIAGNOSIS — F411 Generalized anxiety disorder: Secondary | ICD-10-CM

## 2022-05-11 DIAGNOSIS — F909 Attention-deficit hyperactivity disorder, unspecified type: Secondary | ICD-10-CM

## 2022-05-11 MED ORDER — CLONAZEPAM 0.5 MG PO TABS: 0.2500 mg | ORAL_TABLET | Freq: Every day | ORAL | 0 refills | Status: DC | PRN

## 2022-05-11 MED ORDER — AMPHETAMINE-DEXTROAMPHETAMINE 15 MG PO TABS: 15.0000 mg | ORAL_TABLET | Freq: Two times a day (BID) | ORAL | 0 refills | Status: DC

## 2022-05-11 NOTE — Telephone Encounter (Signed)
Sent medications to pharmacy on file. This is inclusive of the dose change in adderall

## 2022-05-11 NOTE — Telephone Encounter (Signed)
Please see note about increasing Adderall dose to 15 mg that was discussed in previous phone notes

## 2022-05-12 NOTE — Telephone Encounter (Signed)
Spoke with pharmacy and they did have the medication filled. Patient states she called pharmacy after calling us and knows medication is ready.  Patient states on 05/08/22 she was in the laundry room and she bent down to get clothes out of the dryer and when she stood up she hit the corner of cabinet door on top of her head-has goose egg area, today is the first day she felt not as sleepy as she has been. Still has a headache, has had some nausea for a couple of days. She has had dizziness and some blurred vision but she had this after the first injury when she was seen last. Feels like her symptoms have prolonged from the first trauma due to having another trauma. Patient wanted to let provider know in case she needed to proceed with imaging at this time or any other feedback?

## 2022-05-12 NOTE — Telephone Encounter (Signed)
Patient advised.

## 2022-05-12 NOTE — Telephone Encounter (Signed)
Pt called back stated pharmacy did not receive RX . Advise pt that Rx was sent in to Pharmacy . Pt requesting a call back #[phone] (M)

## 2022-05-12 NOTE — Telephone Encounter (Addendum)
I spoke with pt; pt last seen on 05/04/22; pt has continued with H/A since seen on 05/04/22. On 05/08/22 pt raised up and hit top of head on corner of cabinet door. Pt said it did not knock her out. Pt said had goose egg until 05/10/22. On 05/11/22 goose egg had gone away but pt continues with H/A 05/12/22 in AM H/A pain level was 10; pt took ibuprofen and now H/a pain level is 2 - 3. On 05/08/22 - 05/10/22 pt was extremely tired and sleepy. Pt could be aroused easily from sleep and today not feeling sleepy. No dizziness today and there has been no change in vision since seen on 05/04/22. Pt said she would not go to ED and UC would not do anything. I offered pt an appt this afternoon but pt said she had no one to watch her children. Pt could come on 05/13/22 but pt does not want to schedule an appt until Iven Finn NP says that she either does or does not need to be seen and does Susy Frizzle think pt needs CT of head since has had another injury to head.ED precautions given and pt voiced understanding. Pt request cb after Susy Frizzle reviews this note to go forward. Sending note to Audria Nine NP, Anastasiya CMA and will teams Anastasiya.

## 2022-05-12 NOTE — Telephone Encounter (Signed)
It sounds that her symptoms have improved since the second injury. It does not seem like she needs a CT at this time from the report given. I am happy to see her in office if she is concerned

## 2022-05-12 NOTE — Telephone Encounter (Signed)
Patient needs to be triaged.

## 2022-05-15 ENCOUNTER — Other Ambulatory Visit: Payer: Self-pay | Admitting: Nurse Practitioner

## 2022-05-15 DIAGNOSIS — F411 Generalized anxiety disorder: Secondary | ICD-10-CM

## 2022-05-15 DIAGNOSIS — F331 Major depressive disorder, recurrent, moderate: Secondary | ICD-10-CM

## 2022-06-14 ENCOUNTER — Other Ambulatory Visit: Payer: Self-pay | Admitting: Nurse Practitioner

## 2022-06-14 ENCOUNTER — Telehealth: Payer: Self-pay | Admitting: Nurse Practitioner

## 2022-06-14 DIAGNOSIS — F411 Generalized anxiety disorder: Secondary | ICD-10-CM

## 2022-06-14 DIAGNOSIS — F909 Attention-deficit hyperactivity disorder, unspecified type: Secondary | ICD-10-CM

## 2022-06-14 DIAGNOSIS — F331 Major depressive disorder, recurrent, moderate: Secondary | ICD-10-CM

## 2022-06-14 MED ORDER — VENLAFAXINE HCL ER 37.5 MG PO CP24: 37.5000 mg | ORAL_CAPSULE | Freq: Every day | ORAL | 1 refills | Status: DC

## 2022-06-14 MED ORDER — AMPHETAMINE-DEXTROAMPHETAMINE 15 MG PO TABS: 15.0000 mg | ORAL_TABLET | Freq: Two times a day (BID) | ORAL | 0 refills | Status: DC

## 2022-06-14 MED ORDER — CLONAZEPAM 0.5 MG PO TABS: 0.2500 mg | ORAL_TABLET | Freq: Every day | ORAL | 0 refills | Status: DC | PRN

## 2022-06-14 NOTE — Telephone Encounter (Signed)
Please see comment from the patient in regards to the blood draw.

## 2022-06-14 NOTE — Telephone Encounter (Signed)
Patient advised.

## 2022-06-14 NOTE — Telephone Encounter (Signed)
  Encourage patient to contact the pharmacy for refills or they can request refills through [facility]  Did the patient contact the pharmacy: No  LAST APPOINTMENT DATE: 05/04/2022  NEXT APPOINTMENT DATE: 06/15/2022  MEDICATION: clonazePAM (KLONOPIN) 0.5 MG tablet  amphetamine-dextroamphetamine (ADDERALL) 15 MG tablet  venlafaxine XR (EFFEXOR-XR) 37.5 MG 24 hr capsule  Is the patient out of medication? Yes  PHARMACY: [facility], [address]   Comment: Patient is suppose to give blood this afternoon at her kids school. She was wanting to know if that is ok with her taking these medications.   Let patient know to contact pharmacy at the end of the day to make sure medication is ready.  Please notify patient to allow 48-72 hours to process

## 2022-06-15 ENCOUNTER — Ambulatory Visit: Payer: Medicaid Other | Admitting: Nurse Practitioner

## 2022-06-20 ENCOUNTER — Ambulatory Visit: Payer: Medicaid Other | Admitting: Nurse Practitioner

## 2022-06-20 VITALS — BP 122/64 | HR 72 | Temp 97.3°F | Resp 14 | Ht 61.0 in | Wt 156.5 lb

## 2022-06-20 DIAGNOSIS — E663 Overweight: Secondary | ICD-10-CM | POA: Diagnosis not present

## 2022-06-20 DIAGNOSIS — F909 Attention-deficit hyperactivity disorder, unspecified type: Secondary | ICD-10-CM

## 2022-06-20 DIAGNOSIS — R635 Abnormal weight gain: Secondary | ICD-10-CM | POA: Insufficient documentation

## 2022-06-20 MED ORDER — SAXENDA 18 MG/3ML ~~LOC~~ SOPN: PEN_INJECTOR | SUBCUTANEOUS | 0 refills | Status: AC

## 2022-06-20 MED ORDER — "PEN NEEDLES 5/16"" 31G X 8 MM MISC": 1.0000 | 0 refills | Status: DC

## 2022-06-20 NOTE — Patient Instructions (Signed)
Nice to see you today I need to see you in 1 month after you start the injection Follow up sooner if needed

## 2022-06-20 NOTE — Assessment & Plan Note (Signed)
Discussed healthy lifestyle modifications.  We will trial patient on Saxenda.  Patient denies personal or family history of medullary thyroid cancer, multiple endocrine neoplasia syndrome type II, or pancreatitis.

## 2022-06-20 NOTE — Assessment & Plan Note (Signed)
Start patient on Saxenda.  Follow-up 1 month after starting medication

## 2022-06-20 NOTE — Assessment & Plan Note (Addendum)
We will continue patient on Adderall 15 mg twice daily.

## 2022-06-20 NOTE — Progress Notes (Signed)
Established Patient Office Visit  Subjective   Patient ID: Heather [name], female    DOB: 08-Dec-1980  Age: 41 y.o. MRN: [id number]  Chief Complaint  Patient presents with   discuss weight gain    HPI ADHD: states that she feels like the medication is working patient is tolerating Adderall without adverse drug events.  Weight gain: starting weight with the clinic was 146 and a BMI of 27.61. Weight today is 156 and BMI of 29.57. States that she recently bought jeand and they are  tight.  States it is the point to where she does not want to eat.  States that she will eat 2 meals a day with snakc. As of late she will not eat as much. Mountain dews through the day. States that she will drink approx 5 cans 12 oz. A day States that she works and walks states that she gets approx 7-8 through employment.     Review of Systems  Constitutional:  Negative for chills and fever.  Respiratory:  Negative for shortness of breath.   Cardiovascular:  Negative for chest pain.  Psychiatric/Behavioral:  Negative for hallucinations and suicidal ideas.       Objective:     BP 122/64   Pulse 72   Temp (!) 97.3 F (36.3 C) (Temporal)   Resp 14   Ht 5\' 1"  (1.549 m)   Wt 156 lb 8 oz (71 kg)   SpO2 97%   BMI 29.57 kg/m  BP Readings from Last 3 Encounters:  06/20/22 122/64  05/04/22 126/78  04/21/22 120/68   Wt Readings from Last 3 Encounters:  06/20/22 156 lb 8 oz (71 kg)  05/04/22 155 lb (70.3 kg)  04/21/22 149 lb 8 oz (67.8 kg)      Physical Exam Vitals and nursing note reviewed.  Constitutional:      Appearance: Normal appearance.  Cardiovascular:     Rate and Rhythm: Normal rate and regular rhythm.     Heart sounds: Normal heart sounds.  Pulmonary:     Effort: Pulmonary effort is normal.     Breath sounds: Normal breath sounds.  Neurological:     Mental Status: She is alert.      No results found for any visits on 06/20/22.    The ASCVD Risk score (Arnett DK, et  al., 2019) failed to calculate for the following reasons:   The 2019 ASCVD risk score is only valid for ages 55 to 63    Assessment & Plan:   Problem List Items Addressed This Visit       Other   ADHD (attention deficit hyperactivity disorder)    We will continue patient on Adderall 15 mg twice daily.      Overweight    Discussed healthy lifestyle modifications.  We will trial patient on Saxenda.  Patient denies personal or family history of medullary thyroid cancer, multiple endocrine neoplasia syndrome type II, or pancreatitis.      Relevant Medications   Liraglutide -Weight Management (SAXENDA) 18 MG/3ML SOPN   Insulin Pen Needle (PEN NEEDLES 31GX5/16") 31G X 8 MM MISC   Abnormal weight gain - Primary    Start patient on Saxenda.  Follow-up 1 month after starting medication      Relevant Medications   Liraglutide -Weight Management (SAXENDA) 18 MG/3ML SOPN   Insulin Pen Needle (PEN NEEDLES 31GX5/16") 31G X 8 MM MISC    Return in about 4 weeks (around 07/18/2022) for 1 month after starting injections.  Romilda Garret, NP

## 2022-06-21 ENCOUNTER — Telehealth: Payer: Self-pay

## 2022-06-21 NOTE — Telephone Encounter (Signed)
Prior auth started for Saxenda 18MG /3ML pen-injectors. Heather [name] (Key: [id number]) Rx #: [id number] Waiting for determination.

## 2022-06-21 NOTE — Telephone Encounter (Signed)
Prior auth for Heather [name] has been denied.  I called [employer] provider line at [phone], spoke to [place].  PA case # [id number] was denied due to plan exclusion - weight loss medications are not covered by plan.

## 2022-06-22 NOTE — Telephone Encounter (Signed)
Patient advised.

## 2022-06-24 NOTE — Telephone Encounter (Signed)
Pt called in requesting a call back [phone]

## 2022-06-24 NOTE — Telephone Encounter (Signed)
Spoke with patient, she called [employer] insurance and she was advised that provider can appeal and it should get covered but it has to say medically necessary and the reason-patient is really struggling mentally with the weight gain and the mental treatment from her mother because patient gained weight, she is crying for help with this. Please review. Note will need to have explanation for the medical recommendation please. I will fax it over once this is done if ok by the provider.

## 2022-06-27 NOTE — Telephone Encounter (Signed)
Is this a form that needs to be filled out or just a letter from me

## 2022-06-27 NOTE — Telephone Encounter (Signed)
Letter only

## 2022-06-28 NOTE — Telephone Encounter (Signed)
Working on the letter.

## 2022-06-28 NOTE — Telephone Encounter (Signed)
I tried to do a letter but it would not put my name on it.  We can say the following   Patient has been trying to lose weight without success. Her BMi has went from 27.61 to 29.57. She is working on lifestyle modifications. I feel this medication is needed to help on the weight loss journey to prevent co-morbidities that can come with obesity

## 2022-06-28 NOTE — Telephone Encounter (Signed)
Letter ready. Advised patient there is an appeal form that I need her to sign to fax to her insurance along with the letter. Patient will come by tomorrow 06/29/22 and then will fax all the information to insurance company appeal department [phone]

## 2022-06-29 ENCOUNTER — Ambulatory Visit (INDEPENDENT_AMBULATORY_CARE_PROVIDER_SITE_OTHER): Payer: Medicaid Other | Admitting: Family Medicine

## 2022-06-29 ENCOUNTER — Other Ambulatory Visit (HOSPITAL_COMMUNITY)
Admission: RE | Admit: 2022-06-29 | Discharge: 2022-06-29 | Disposition: A | Discharge status: Home / Self Care | Payer: Medicaid Other | Source: Ambulatory Visit | Attending: Family Medicine | Admitting: Family Medicine

## 2022-06-29 ENCOUNTER — Encounter: Payer: Self-pay | Admitting: Family Medicine

## 2022-06-29 VITALS — BP 127/75 | HR 84 | Wt 158.0 lb

## 2022-06-29 DIAGNOSIS — N390 Urinary tract infection, site not specified: Secondary | ICD-10-CM

## 2022-06-29 DIAGNOSIS — K8020 Calculus of gallbladder without cholecystitis without obstruction: Secondary | ICD-10-CM

## 2022-06-29 DIAGNOSIS — Z1231 Encounter for screening mammogram for malignant neoplasm of breast: Secondary | ICD-10-CM

## 2022-06-29 DIAGNOSIS — Z01411 Encounter for gynecological examination (general) (routine) with abnormal findings: Secondary | ICD-10-CM | POA: Diagnosis not present

## 2022-06-29 DIAGNOSIS — N914 Secondary oligomenorrhea: Secondary | ICD-10-CM | POA: Diagnosis not present

## 2022-06-29 DIAGNOSIS — Z124 Encounter for screening for malignant neoplasm of cervix: Secondary | ICD-10-CM | POA: Insufficient documentation

## 2022-06-29 LAB — POCT URINALYSIS DIPSTICK

## 2022-06-29 MED ORDER — FLUCONAZOLE 150 MG PO TABS: 150.0000 mg | ORAL_TABLET | Freq: Every day | ORAL | 2 refills | Status: DC

## 2022-06-29 MED ORDER — SULFAMETHOXAZOLE-TRIMETHOPRIM 800-160 MG PO TABS: 1.0000 | ORAL_TABLET | Freq: Two times a day (BID) | ORAL | 0 refills | Status: AC

## 2022-06-29 NOTE — Assessment & Plan Note (Signed)
40814 - u/a consistent with again, she is symptomatic. Will treat and send for cx--rx for yeast given as well. Referral > urology

## 2022-06-29 NOTE — Assessment & Plan Note (Addendum)
99214 - Re-referral to gen surg-->advised to consider removal given her increased symptoms.

## 2022-06-29 NOTE — Assessment & Plan Note (Signed)
11021 -  Check FSH, nml thyroid labs 6/23.

## 2022-06-29 NOTE — Progress Notes (Signed)
Having issues with recurrent UTI's, and they last a long time as well  Having irregular cycles now

## 2022-06-29 NOTE — Patient Instructions (Signed)

## 2022-06-29 NOTE — Progress Notes (Signed)
Subjective:     Heather [name] is a 41 y.o. female and is here for a comprehensive physical exam. The patient reports problems - several . Has recurrent UTI. Has some post-coital bleeding. Has some irregular bleeding when her cycle was prior very regular. She has h/o cholelithiasis, and is having more symptoms lately. She is interested in seeing surgery again.  The following portions of the patient's history were reviewed and updated as appropriate: allergies, current medications, past family history, past medical history, past social history, past surgical history, and problem list.  Review of Systems Pertinent items noted in HPI and remainder of comprehensive ROS otherwise negative.   Objective:    BP 127/75   Pulse 84   Wt 158 lb (71.7 kg)   BMI 29.85 kg/m  General appearance: alert, cooperative, and appears stated age Head: Normocephalic, without obvious abnormality, atraumatic Neck: no adenopathy, supple, symmetrical, trachea midline, and thyroid not enlarged, symmetric, no tenderness/mass/nodules Lungs: clear to auscultation bilaterally Breasts: normal appearance, no masses or tenderness Heart: regular rate and rhythm, S1, S2 normal, no murmur, click, rub or gallop Abdomen: soft, non-tender; bowel sounds normal; no masses,  no organomegaly Pelvic: cervix normal in appearance, external genitalia normal, no adnexal masses or tenderness, no cervical motion tenderness, uterus normal size, shape, and consistency, and vagina normal without discharge Extremities: extremities normal, atraumatic, no cyanosis or edema Pulses: 2+ and symmetric Skin: Skin color, texture, turgor normal. No rashes or lesions Lymph nodes: Cervical, supraclavicular, and axillary nodes normal. Neurologic: Grossly normal    Assessment:    Healthy female exam.      Plan:   Problem List Items Addressed This Visit       Unprioritized   Recurrent UTI    99214 - u/a consistent with again, she is  symptomatic. Will treat and send for cx--rx for yeast given as well. Referral > urology      Relevant Medications   sulfamethoxazole-trimethoprim (BACTRIM DS) 800-160 MG tablet   fluconazole (DIFLUCAN) 150 MG tablet   Other Relevant Orders   Ambulatory referral to Urology   POCT Urinalysis Dipstick (Completed)   Urine Culture   Calculus of gallbladder without cholecystitis without obstruction    44315 - Re-referral to gen surg-->advised to consider removal given her increased symptoms.      Relevant Orders   Ambulatory referral to General Surgery   Secondary oligomenorrhea    99214 -  Check FSH, nml thyroid labs 6/23.      Relevant Orders   Follicle stimulating hormone   Other Visit Diagnoses     Screening for malignant neoplasm of cervix    -  Primary   [id number]   Relevant Orders   Cytology - PAP   Encounter for gynecological examination with abnormal finding       99395   Encounter for screening mammogram for malignant neoplasm of breast       99395   Relevant Orders   MM 3D SCREEN BREAST BILATERAL      Return in 4 weeks (on 07/27/2022) for cryosurgery.    See After Visit Summary for Counseling Recommendations

## 2022-06-30 ENCOUNTER — Ambulatory Visit: Payer: Medicaid Other | Admitting: Nurse Practitioner

## 2022-06-30 ENCOUNTER — Telehealth: Payer: Self-pay | Admitting: Nurse Practitioner

## 2022-06-30 LAB — FOLLICLE STIMULATING HORMONE: FSH: 4 m[IU]/mL

## 2022-06-30 NOTE — Telephone Encounter (Signed)
Lisa from [employer] called to let provider know that she is going to uphold medication Saxenda,a non covered medication,on behalf of patient.

## 2022-06-30 NOTE — Telephone Encounter (Signed)
Left message for Heather [name] to call back or fax back information about this, want to clarify is the appeal approved or denied.

## 2022-06-30 NOTE — Telephone Encounter (Signed)
Information faxed over

## 2022-07-01 LAB — URINE CULTURE

## 2022-07-04 ENCOUNTER — Other Ambulatory Visit: Payer: Self-pay | Admitting: Nurse Practitioner

## 2022-07-04 DIAGNOSIS — F411 Generalized anxiety disorder: Secondary | ICD-10-CM

## 2022-07-04 NOTE — Telephone Encounter (Signed)
I called again and no answer, no fax has been received either so far

## 2022-07-05 MED ORDER — CLONAZEPAM 0.5 MG PO TABS: 0.2500 mg | ORAL_TABLET | Freq: Every day | ORAL | 0 refills | Status: DC | PRN

## 2022-07-05 NOTE — Telephone Encounter (Signed)
Received letter from insurance stating Appeal is denied. Placed this information in Heather [name]'s inbox. Any recommendations or feedback for the patient before I call ?

## 2022-07-05 NOTE — Telephone Encounter (Signed)
Refill request for clonazePAM (KLONOPIN) 0.5 MG tablet  LOV - 06/20/22 Next OV - 09/21/22 Last refill - 06/14/22 #10/0

## 2022-07-06 LAB — CYTOLOGY - PAP
Chlamydia: NEGATIVE
Comment: NEGATIVE
Comment: NEGATIVE
Comment: NEGATIVE
Comment: NEGATIVE
Comment: NEGATIVE
Comment: NORMAL
Diagnosis: NEGATIVE
Diagnosis: REACTIVE
HPV 16: POSITIVE — AB
HPV 18 / 45: NEGATIVE
High risk HPV: POSITIVE — AB
Neisseria Gonorrhea: NEGATIVE
Trichomonas: NEGATIVE

## 2022-07-06 NOTE — Telephone Encounter (Signed)
Patient states she had partial done at [facility] and insurance is wanting a note from PCP not the dentist in regards to the need for the partial and necessity for it. Patient states needs a note stating that without having the partial it affects her chewing/eating food. She asked me to call [facility] and speak with the manager there, she was not sure of the name, to get the information to make sure we have everything correct that is needed. I called left a message for the manager with the receptionist and manager should be in at some point after 2 pm. Patient asked me to send her a message once everything is completed.

## 2022-07-06 NOTE — Telephone Encounter (Signed)
Mychart message sent to the patient .

## 2022-07-06 NOTE — Telephone Encounter (Signed)
Since the medication is denied we can see if her insurance will pay for her to see a nutritionist.

## 2022-07-07 ENCOUNTER — Encounter: Payer: Self-pay | Admitting: Family Medicine

## 2022-07-11 NOTE — Telephone Encounter (Signed)
Called and left a message again in regards to this. Waiting for feedback from manager that will be in after 2 pm

## 2022-07-15 ENCOUNTER — Other Ambulatory Visit: Payer: Self-pay | Admitting: Nurse Practitioner

## 2022-07-15 DIAGNOSIS — F909 Attention-deficit hyperactivity disorder, unspecified type: Secondary | ICD-10-CM

## 2022-07-15 MED ORDER — AMPHETAMINE-DEXTROAMPHETAMINE 15 MG PO TABS: 15.0000 mg | ORAL_TABLET | Freq: Two times a day (BID) | ORAL | 0 refills | Status: DC

## 2022-07-19 ENCOUNTER — Other Ambulatory Visit: Payer: Self-pay | Admitting: *Deleted

## 2022-07-19 ENCOUNTER — Ambulatory Visit (HOSPITAL_COMMUNITY): Payer: Medicaid Other | Admitting: Clinical

## 2022-07-19 DIAGNOSIS — K8020 Calculus of gallbladder without cholecystitis without obstruction: Secondary | ICD-10-CM

## 2022-07-20 ENCOUNTER — Encounter: Payer: Self-pay | Admitting: Family Medicine

## 2022-07-20 ENCOUNTER — Ambulatory Visit (INDEPENDENT_AMBULATORY_CARE_PROVIDER_SITE_OTHER): Payer: Medicaid Other | Admitting: Family Medicine

## 2022-07-20 ENCOUNTER — Other Ambulatory Visit (HOSPITAL_COMMUNITY)
Admission: RE | Admit: 2022-07-20 | Discharge: 2022-07-20 | Disposition: A | Discharge status: Home / Self Care | Payer: Medicaid Other | Source: Ambulatory Visit | Attending: Family Medicine | Admitting: Family Medicine

## 2022-07-20 VITALS — BP 107/70 | HR 80 | Wt 159.0 lb

## 2022-07-20 DIAGNOSIS — N888 Other specified noninflammatory disorders of cervix uteri: Secondary | ICD-10-CM | POA: Diagnosis not present

## 2022-07-20 DIAGNOSIS — R87618 Other abnormal cytological findings on specimens from cervix uteri: Secondary | ICD-10-CM

## 2022-07-20 DIAGNOSIS — K8020 Calculus of gallbladder without cholecystitis without obstruction: Secondary | ICD-10-CM

## 2022-07-20 DIAGNOSIS — D060 Carcinoma in situ of endocervix: Secondary | ICD-10-CM | POA: Diagnosis not present

## 2022-07-20 DIAGNOSIS — B977 Papillomavirus as the cause of diseases classified elsewhere: Secondary | ICD-10-CM | POA: Insufficient documentation

## 2022-07-20 DIAGNOSIS — N72 Inflammatory disease of cervix uteri: Secondary | ICD-10-CM | POA: Diagnosis not present

## 2022-07-20 NOTE — Progress Notes (Signed)
    GYNECOLOGY OFFICE COLPOSCOPY PROCEDURE NOTE  41 y.o. 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 Discussed role for HPV in cervical dysplasia, need for surveillance.  Patient gave informed written consent, time out was performed.  Placed in lithotomy position. Cervix viewed with speculum and colposcope after application of acetic acid.   Colposcopy adequate? Yes  acetowhite lesion(s) noted at SCJ with some punctation; corresponding biopsies obtained.  ECC specimen obtained. All specimens were labeled and sent to pathology.  Chaperone was present during entire procedure.  Patient was given post procedure instructions.  Will follow up pathology and manage accordingly; patient will be contacted with results and recommendations.  Routine preventative health maintenance measures emphasized.  For Cryo or LEEP based on results. For cryo for postcoital bleeding already scheduled but increase to LEEP if needed.   Reva Bores, MD 07/20/2022 2:35 PM

## 2022-07-20 NOTE — Progress Notes (Signed)
Patient presents for Colpo today.   Pt is on cycle yet states bleeding is now very light.  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

## 2022-07-22 ENCOUNTER — Encounter: Payer: Self-pay | Admitting: Family Medicine

## 2022-07-22 DIAGNOSIS — Z720 Tobacco use: Secondary | ICD-10-CM

## 2022-07-22 LAB — SURGICAL PATHOLOGY

## 2022-07-22 MED ORDER — VARENICLINE TARTRATE 0.5 MG PO TABS: 0.5000 mg | ORAL_TABLET | Freq: Two times a day (BID) | ORAL | 0 refills | Status: DC

## 2022-07-22 MED ORDER — VARENICLINE TARTRATE 1 MG PO TABS: 1.0000 mg | ORAL_TABLET | Freq: Two times a day (BID) | ORAL | 1 refills | Status: DC

## 2022-07-25 ENCOUNTER — Encounter: Payer: Self-pay | Admitting: Nurse Practitioner

## 2022-07-25 NOTE — Telephone Encounter (Signed)
For medication adjustment she will need an appointment

## 2022-07-27 ENCOUNTER — Encounter: Payer: Medicaid Other | Admitting: Family Medicine

## 2022-07-27 ENCOUNTER — Telehealth: Payer: Self-pay | Admitting: Nurse Practitioner

## 2022-07-27 ENCOUNTER — Other Ambulatory Visit: Payer: Self-pay | Admitting: Nurse Practitioner

## 2022-07-27 DIAGNOSIS — F411 Generalized anxiety disorder: Secondary | ICD-10-CM

## 2022-07-27 NOTE — Telephone Encounter (Signed)
To help with withdrawal she can wean her self slowly or use otc nicotine patches to help with the symptoms. There is no treatment to help with nicotine withdrawals besides dealing with it or having nicotine

## 2022-07-27 NOTE — Telephone Encounter (Signed)
  Encourage patient to contact the pharmacy for refills or they can request refills through [facility]  Did the patient contact the pharmacy: no    LAST APPOINTMENT DATE:  Please schedule appointment if longer than 1 year  NEXT APPOINTMENT DATE:08/04/2022  MEDICATION:clonazePAM (KLONOPIN) 0.5 MG tablet   Is the patient out of medication? yes  If not, how much is left?  Is this a 90 day supply: no  PHARMACY: [id number] - [place], [place] - [address] Phone: [phone]  Fax: [phone]      Let patient know to contact pharmacy at the end of the day to make sure medication is ready.  Please notify patient to allow 48-72 hours to process   Please advise

## 2022-07-27 NOTE — Telephone Encounter (Signed)
Patient decided to stop/cut back on smoking cigarrettes with recent pre cancerous scare with GYN. Bought cigarrettes 1 pack Thursday or Friday last week 11/16 or 07/22/22, smoked for 27 years ago, 1 pack of day at least, 1 pack lasted until today, has 1 cigarette left. She said her tongue has been burning for the past several days and found out from another person they had the same thing happened to them and they smoked mentol cigarettes too for many years, and she googled that could be a symptom from withdrawing from smoking. She wanted to see if we can offer her any help that she can use try OTC or RX, mouth wash or something. Her mouth is also dry feeling. This all started since cutting back drastically in smoking.

## 2022-07-27 NOTE — Telephone Encounter (Signed)
Patient advised.

## 2022-07-27 NOTE — Telephone Encounter (Signed)
Patient called and stated she having side effects from side effects. Call back number [phone]

## 2022-07-27 NOTE — Telephone Encounter (Signed)
Called patient and sent mychart message. Need more information

## 2022-08-01 MED ORDER — CLONAZEPAM 0.5 MG PO TABS: 0.2500 mg | ORAL_TABLET | Freq: Every day | ORAL | 0 refills | Status: DC | PRN

## 2022-08-02 ENCOUNTER — Ambulatory Visit: Payer: Medicaid Other | Admitting: Nurse Practitioner

## 2022-08-02 ENCOUNTER — Encounter: Payer: Self-pay | Admitting: Nurse Practitioner

## 2022-08-02 VITALS — BP 112/76 | HR 80 | Temp 98.2°F | Resp 14 | Ht 61.0 in | Wt 160.4 lb

## 2022-08-02 DIAGNOSIS — F411 Generalized anxiety disorder: Secondary | ICD-10-CM

## 2022-08-02 DIAGNOSIS — E559 Vitamin D deficiency, unspecified: Secondary | ICD-10-CM

## 2022-08-02 DIAGNOSIS — E538 Deficiency of other specified B group vitamins: Secondary | ICD-10-CM | POA: Diagnosis not present

## 2022-08-02 DIAGNOSIS — F5102 Adjustment insomnia: Secondary | ICD-10-CM

## 2022-08-02 DIAGNOSIS — Z862 Personal history of diseases of the blood and blood-forming organs and certain disorders involving the immune mechanism: Secondary | ICD-10-CM | POA: Diagnosis not present

## 2022-08-02 DIAGNOSIS — E669 Obesity, unspecified: Secondary | ICD-10-CM | POA: Insufficient documentation

## 2022-08-02 DIAGNOSIS — F909 Attention-deficit hyperactivity disorder, unspecified type: Secondary | ICD-10-CM | POA: Diagnosis not present

## 2022-08-02 LAB — TSH: TSH: 2.51 u[IU]/mL (ref 0.35–5.50)

## 2022-08-02 LAB — CBC
HCT: 42.1 % (ref 36.0–46.0)
Hemoglobin: 14.1 g/dL (ref 12.0–15.0)
MCHC: 33.5 g/dL (ref 30.0–36.0)
MCV: 89.1 fl (ref 78.0–100.0)
Platelets: 387 10*3/uL (ref 150.0–400.0)
RBC: 4.73 Mil/uL (ref 3.87–5.11)
RDW: 12.9 % (ref 11.5–15.5)
WBC: 8 10*3/uL (ref 4.0–10.5)

## 2022-08-02 LAB — IBC + FERRITIN
Ferritin: 17.7 ng/mL (ref 10.0–291.0)
Iron: 80 ug/dL (ref 42–145)
Saturation Ratios: 23.4 % (ref 20.0–50.0)
TIBC: 341.6 ug/dL (ref 250.0–450.0)
Transferrin: 244 mg/dL (ref 212.0–360.0)

## 2022-08-02 LAB — VITAMIN D 25 HYDROXY (VIT D DEFICIENCY, FRACTURES): VITD: 23.01 ng/mL — ABNORMAL LOW (ref 30.00–100.00)

## 2022-08-02 LAB — VITAMIN B12: Vitamin B-12: 243 pg/mL (ref 211–911)

## 2022-08-02 MED ORDER — AMPHETAMINE-DEXTROAMPHETAMINE 20 MG PO TABS: 20.0000 mg | ORAL_TABLET | Freq: Every day | ORAL | 0 refills | Status: DC

## 2022-08-02 MED ORDER — TRAZODONE HCL 50 MG PO TABS: 25.0000 mg | ORAL_TABLET | Freq: Every evening | ORAL | 0 refills | Status: DC | PRN

## 2022-08-02 NOTE — Assessment & Plan Note (Signed)
More difficulty and getting to sleep and staying asleep since she stopped smoking.  Has tried trazodone in the past without great relief we will try again.  Has tried hydroxyzine with no benefit.  Has also tried Ambien but had hallucinations.  Offered to try Remeron or mirtazapine but patient politely declined due to weight gain side effect

## 2022-08-02 NOTE — Assessment & Plan Note (Signed)
Currently maintained on Effexor and as needed Klonopin.  Klonopin use has increased.  Patient has tried BuSpar and hydroxyzine in the past without benefit.  Did offer to try mirtazapine to help with sleep and anxiety but patient politely declined due to the side effect of possible weight gain.

## 2022-08-02 NOTE — Assessment & Plan Note (Signed)
Patient having difficulties with current medication regimen.  Follow-up if symptoms this current month's worth of Adderall and then titrate up to 20 mg twice daily.  Did caution patient that this can be increased for insomnia.  If this happens I need to know

## 2022-08-02 NOTE — Progress Notes (Signed)
Established Patient Office Visit  Subjective   Patient ID: Heather [name], female    DOB: [birth year]  Age: 41 y.o. MRN: [id number]  Chief Complaint  Patient presents with   Medication Management    Discuss changing dose of Adderall for better control with symptoms   Insomnia    She stopped smoking and drinking soda. Since quitting smoking she has not been able to sleep, wakes up hour on an hour. More anxious and has had to take Clonazepam 0.5 mg 1 tablet instead of 1/2 tablet.       Adderall: States that she noticed that even before she quit smoking. She has noticed that she is taking her second dose earlier. States that she feelsl ike she could use her secod dose already. States that   Sleep: States that since she cut out smoking and drinking soda she is having sleepless. She is waking up every hour on the hour. States that she does not feel rested. States that she has a history of anxiety and has been worse since she stopped smoking  Patient has had genetic testing done to see which medications will be beneficial for her.  She has tried buspirone and hydroxyzine in the past without relief of anxiety or sleep.  Did offer to trial patient on mirtazapine but she does not want to do that since it carries the side effect of weight gain.  Smoking cessatoin: State that GYN put  on chantix and then she read the side effects and did not take the medication. States that she quit cold [place] and has been smoke free for a week. 9 days without mountain Dew   Review of Systems  Constitutional:  Negative for chills and fever.  Respiratory:  Negative for shortness of breath.   Cardiovascular:  Negative for chest pain.  Psychiatric/Behavioral:  The patient is nervous/anxious and has insomnia.       Objective:     BP 112/76   Pulse 80   Temp 98.2 F (36.8 C)   Resp 14   Ht 5\' 1"  (1.549 m)   Wt 160 lb 6 oz (72.7 kg)   SpO2 97%   BMI 30.30 kg/m  BP Readings from Last 3 Encounters:   08/02/22 112/76  07/20/22 107/70  06/29/22 127/75   Wt Readings from Last 3 Encounters:  08/02/22 160 lb 6 oz (72.7 kg)  07/20/22 159 lb (72.1 kg)  06/29/22 158 lb (71.7 kg)      Physical Exam Vitals and nursing note reviewed.  Constitutional:      Appearance: Normal appearance.  Cardiovascular:     Rate and Rhythm: Normal rate and regular rhythm.  Pulmonary:     Effort: Pulmonary effort is normal.     Breath sounds: Normal breath sounds.  Neurological:     Mental Status: She is alert.      No results found for any visits on 08/02/22.    The ASCVD Risk score (Arnett DK, et al., 2019) failed to calculate for the following reasons:   The 2019 ASCVD risk score is only valid for ages 63 to 63    Assessment & Plan:   Problem List Items Addressed This Visit       Other   Generalized anxiety disorder - Primary    Currently maintained on Effexor and as needed Klonopin.  Klonopin use has increased.  Patient has tried BuSpar and hydroxyzine in the past without benefit.  Did offer to try mirtazapine to help with  sleep and anxiety but patient politely declined due to the side effect of possible weight gain.      Relevant Medications   traZODone (DESYREL) 50 MG tablet   Vitamin B12 deficiency    Pending labs      Relevant Orders   Vitamin B12   Vitamin D deficiency    Pending labs      Relevant Orders   VITAMIN D 25 Hydroxy (Vit-D Deficiency, Fractures)   ADHD (attention deficit hyperactivity disorder)    Patient having difficulties with current medication regimen.  Follow-up if symptoms this current month's worth of Adderall and then titrate up to 20 mg twice daily.  Did caution patient that this can be increased for insomnia.  If this happens I need to know      Relevant Medications   amphetamine-dextroamphetamine (ADDERALL) 20 MG tablet   Obesity (BMI 30-39.9)   Relevant Medications   amphetamine-dextroamphetamine (ADDERALL) 20 MG tablet   Other Relevant  Orders   Amb Ref to Medical Weight Management   Adjustment insomnia    More difficulty and getting to sleep and staying asleep since she stopped smoking.  Has tried trazodone in the past without great relief we will try again.  Has tried hydroxyzine with no benefit.  Has also tried Ambien but had hallucinations.  Offered to try Remeron or mirtazapine but patient politely declined due to weight gain side effect      Relevant Medications   traZODone (DESYREL) 50 MG tablet   Other Relevant Orders   CBC   TSH   History of anemia   Relevant Orders   IBC + Ferritin    No follow-ups on file.    Audria Nine, NP

## 2022-08-02 NOTE — Assessment & Plan Note (Signed)
Pending labs

## 2022-08-02 NOTE — Patient Instructions (Signed)
Nice to see you today I will be in touch with the labs Reach out to me on mychart in a month for an update

## 2022-08-03 ENCOUNTER — Other Ambulatory Visit (HOSPITAL_COMMUNITY)
Admission: RE | Admit: 2022-08-03 | Discharge: 2022-08-03 | Disposition: A | Discharge status: Home / Self Care | Payer: Medicaid Other | Source: Ambulatory Visit | Attending: Family Medicine | Admitting: Family Medicine

## 2022-08-03 ENCOUNTER — Ambulatory Visit (INDEPENDENT_AMBULATORY_CARE_PROVIDER_SITE_OTHER): Payer: Medicaid Other | Admitting: Family Medicine

## 2022-08-03 ENCOUNTER — Encounter (INDEPENDENT_AMBULATORY_CARE_PROVIDER_SITE_OTHER): Payer: Self-pay

## 2022-08-03 ENCOUNTER — Other Ambulatory Visit: Payer: Self-pay | Admitting: Nurse Practitioner

## 2022-08-03 VITALS — BP 111/75 | HR 73

## 2022-08-03 DIAGNOSIS — D069 Carcinoma in situ of cervix, unspecified: Secondary | ICD-10-CM | POA: Insufficient documentation

## 2022-08-03 DIAGNOSIS — E559 Vitamin D deficiency, unspecified: Secondary | ICD-10-CM

## 2022-08-03 MED ORDER — VITAMIN D (ERGOCALCIFEROL) 1.25 MG (50000 UNIT) PO CAPS: 50000.0000 [IU] | ORAL_CAPSULE | ORAL | 0 refills | Status: DC

## 2022-08-03 NOTE — Progress Notes (Signed)
   GYNECOLOGY OFFICE PROCEDURE NOTE Heather [name] is a 41 y.o. [phone] here for LEEP. No GYN concerns. Pap smear and colposcopy history reviewed.  Patient quit smoking.  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  Role of HPV,cervical dysplasia and need for close followup was empasized. Informed written consent was obtained. All questions were answered. Time out performed. Urine pregnancy test was negative.  ??Procedure: The patient was placed in lithotomy position and the bivalved coated speculum was placed in the patient's vagina. A grounding pad placed on the patient. Local anesthesia was administered via an intracervical block using 20 ml of 2% Lidocaine with epinephrine. Colposcopy performed. The suction was turned on and the Large 1X Fisher Cone Biopsy Excisor on 79 Watts of blended current was used to excise the entire transformation zone. Excellent hemostasis was achieved using roller ball coagulation set at 60 Watts coagulation current. Monsel's solution was then applied and the speculum was removed from the vagina. Specimens were sent to pathology.  ?The patient tolerated the procedure well. Post-operative instructions given to patient, including instruction to seek medical attention for persistent bright red bleeding, fever, abdominal/pelvic pain, dysuria, nausea or vomiting. She was also told about the possibility of having copious yellow to black tinged discharge for weeks. She was counseled to avoid anything in the vagina (sex/douching/tampons) for 3 weeks.   Return in about 6 months (around 02/01/2023) for repeat pap.  Reva Bores, MD 08/03/2022 12:56 PM

## 2022-08-04 ENCOUNTER — Ambulatory Visit: Payer: Medicaid Other | Admitting: Nurse Practitioner

## 2022-08-08 ENCOUNTER — Encounter: Payer: Self-pay | Admitting: Urology

## 2022-08-08 ENCOUNTER — Ambulatory Visit: Payer: Medicaid Other | Admitting: Urology

## 2022-08-08 VITALS — BP 115/75 | HR 76 | Ht 61.5 in | Wt 159.0 lb

## 2022-08-08 DIAGNOSIS — N390 Urinary tract infection, site not specified: Secondary | ICD-10-CM

## 2022-08-08 LAB — URINALYSIS, COMPLETE
Bilirubin, UA: NEGATIVE
Glucose, UA: NEGATIVE
Ketones, UA: NEGATIVE
Nitrite, UA: NEGATIVE
Specific Gravity, UA: 1.025 (ref 1.005–1.030)
Urobilinogen, Ur: 1 mg/dL (ref 0.2–1.0)
pH, UA: 5.5 (ref 5.0–7.5)

## 2022-08-08 LAB — MICROSCOPIC EXAMINATION

## 2022-08-08 MED ORDER — TRIMETHOPRIM 100 MG PO TABS: 100.0000 mg | ORAL_TABLET | Freq: Every day | ORAL | 3 refills | Status: DC

## 2022-08-08 NOTE — Addendum Note (Signed)
Addended by: Sueanne Margarita on: 08/08/2022 09:30 AM   Modules accepted: Orders
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
## 2022-08-09 LAB — SURGICAL PATHOLOGY

## 2022-08-11 LAB — CULTURE, URINE COMPREHENSIVE

## 2022-08-14 ENCOUNTER — Other Ambulatory Visit: Payer: Self-pay | Admitting: Nurse Practitioner

## 2022-08-14 ENCOUNTER — Encounter: Payer: Self-pay | Admitting: Nurse Practitioner

## 2022-08-14 DIAGNOSIS — K8020 Calculus of gallbladder without cholecystitis without obstruction: Secondary | ICD-10-CM

## 2022-08-14 DIAGNOSIS — F411 Generalized anxiety disorder: Secondary | ICD-10-CM

## 2022-08-15 ENCOUNTER — Telehealth: Payer: Self-pay | Admitting: *Deleted

## 2022-08-15 ENCOUNTER — Other Ambulatory Visit: Payer: Self-pay | Admitting: *Deleted

## 2022-08-15 MED ORDER — SULFAMETHOXAZOLE-TRIMETHOPRIM 800-160 MG PO TABS: 1.0000 | ORAL_TABLET | Freq: Two times a day (BID) | ORAL | 0 refills | Status: AC

## 2022-08-15 MED ORDER — CLONAZEPAM 0.5 MG PO TABS: 0.2500 mg | ORAL_TABLET | Freq: Every day | ORAL | 0 refills | Status: DC | PRN

## 2022-08-15 NOTE — Telephone Encounter (Signed)
Pharm re sent prior auth 10 minutes ago, per Charlynne Pander. [facility] in [place].

## 2022-08-15 NOTE — Telephone Encounter (Signed)
Sent prior auth to [facility] today :  Heather [name] ([employer]gernon Huxley) - [id number] Trimethoprim 100MG  tablets Status: PA RequestCreated: December 4th, 2023 December 6th, 2023 Sent: December 11th, 2023

## 2022-08-15 NOTE — Telephone Encounter (Signed)
Rx for trimpex denied, is there something else she can try?      Outcome Denied today PA Case: [id number], Status: Denied. Notification: Completed. Drug Trimethoprim 100MG  tablets ePA cloud logo Form [facility] [place] [employer] [place] Form [phone] NCPDP) Original Claim Info [phone] FOR 3 DS O/R, USE [facility] 72,53,66,YQ NON-FORMULARY DRUG, CONTACT PRESCRIBERMAXIMUM DAYS SUPPLY OF 3

## 2022-08-16 MED ORDER — NITROFURANTOIN MONOHYD MACRO 100 MG PO CAPS: 100.0000 mg | ORAL_CAPSULE | Freq: Every day | ORAL | 3 refills | Status: DC

## 2022-08-16 NOTE — Addendum Note (Signed)
Addended by: Sueanne Margarita on: 08/16/2022 01:49 PM   Modules accepted: Orders

## 2022-08-16 NOTE — Telephone Encounter (Signed)
Spoke with patient and advised new medication and per pt she will take this but it never works. rx sent to pharmacy by e-script

## 2022-08-18 ENCOUNTER — Ambulatory Visit: Payer: Medicaid Other | Attending: Urology

## 2022-08-26 ENCOUNTER — Ambulatory Visit
Admission: RE | Admit: 2022-08-26 | Discharge: 2022-08-26 | Disposition: A | Discharge status: Home / Self Care | Payer: Medicaid Other | Source: Ambulatory Visit | Attending: Family Medicine | Admitting: Family Medicine

## 2022-08-26 DIAGNOSIS — Z1231 Encounter for screening mammogram for malignant neoplasm of breast: Secondary | ICD-10-CM | POA: Diagnosis not present

## 2022-09-08 ENCOUNTER — Other Ambulatory Visit: Payer: Self-pay | Admitting: Nurse Practitioner

## 2022-09-08 DIAGNOSIS — F909 Attention-deficit hyperactivity disorder, unspecified type: Secondary | ICD-10-CM

## 2022-09-08 DIAGNOSIS — F411 Generalized anxiety disorder: Secondary | ICD-10-CM

## 2022-09-08 MED ORDER — CLONAZEPAM 0.5 MG PO TABS: 0.2500 mg | ORAL_TABLET | Freq: Every day | ORAL | 0 refills | Status: DC | PRN

## 2022-09-08 MED ORDER — AMPHETAMINE-DEXTROAMPHETAMINE 20 MG PO TABS: 20.0000 mg | ORAL_TABLET | Freq: Two times a day (BID) | ORAL | 0 refills | Status: DC

## 2022-09-08 MED ORDER — L-METHYLFOLATE 15 MG PO TABS: 15.0000 mg | ORAL_TABLET | ORAL | 1 refills | Status: DC

## 2022-09-17 ENCOUNTER — Encounter: Payer: Self-pay | Admitting: Nurse Practitioner

## 2022-09-19 ENCOUNTER — Telehealth: Payer: Self-pay | Admitting: Nurse Practitioner

## 2022-09-19 ENCOUNTER — Other Ambulatory Visit: Payer: Self-pay

## 2022-09-19 DIAGNOSIS — F909 Attention-deficit hyperactivity disorder, unspecified type: Secondary | ICD-10-CM

## 2022-09-19 MED ORDER — AMPHETAMINE-DEXTROAMPHETAMINE 20 MG PO TABS: 20.0000 mg | ORAL_TABLET | Freq: Two times a day (BID) | ORAL | 0 refills | Status: DC

## 2022-09-19 NOTE — Telephone Encounter (Signed)
Prescription was sent to requested pharmacy

## 2022-09-19 NOTE — Telephone Encounter (Signed)
Pharmacy sent over a new Rx request for Adderall 20 mg because they said the rx that was sent in on 09/08/2022 was canceled by the computer.

## 2022-09-19 NOTE — Telephone Encounter (Signed)
Robin from [facility] called in and stated that some how in there system the amphetamine-dextroamphetamine (ADDERALL) 20 MG tablet prescription was cancelled. She is needing a new prescription sent over for the medication. Please advise. Thank you!

## 2022-09-20 ENCOUNTER — Ambulatory Visit
Admission: RE | Admit: 2022-09-20 | Discharge: 2022-09-20 | Disposition: A | Discharge status: Home / Self Care | Payer: Medicaid Other | Source: Ambulatory Visit | Attending: Urology | Admitting: Urology

## 2022-09-20 DIAGNOSIS — N390 Urinary tract infection, site not specified: Secondary | ICD-10-CM | POA: Insufficient documentation

## 2022-09-21 ENCOUNTER — Encounter: Payer: Self-pay | Admitting: Nurse Practitioner

## 2022-09-21 ENCOUNTER — Ambulatory Visit: Payer: Medicaid Other | Admitting: Nurse Practitioner

## 2022-09-21 VITALS — BP 118/68 | HR 75 | Ht 61.5 in | Wt 167.0 lb

## 2022-09-21 DIAGNOSIS — F909 Attention-deficit hyperactivity disorder, unspecified type: Secondary | ICD-10-CM | POA: Diagnosis not present

## 2022-09-21 DIAGNOSIS — F411 Generalized anxiety disorder: Secondary | ICD-10-CM

## 2022-09-21 DIAGNOSIS — E559 Vitamin D deficiency, unspecified: Secondary | ICD-10-CM

## 2022-09-21 DIAGNOSIS — E669 Obesity, unspecified: Secondary | ICD-10-CM

## 2022-09-21 DIAGNOSIS — R110 Nausea: Secondary | ICD-10-CM | POA: Diagnosis not present

## 2022-09-21 LAB — COMPREHENSIVE METABOLIC PANEL
ALT: 9 U/L (ref 0–35)
AST: 11 U/L (ref 0–37)
Albumin: 4 g/dL (ref 3.5–5.2)
Alkaline Phosphatase: 65 U/L (ref 39–117)
BUN: 7 mg/dL (ref 6–23)
CO2: 27 mEq/L (ref 19–32)
Calcium: 8.5 mg/dL (ref 8.4–10.5)
Chloride: 106 mEq/L (ref 96–112)
Creatinine, Ser: 0.64 mg/dL (ref 0.40–1.20)
GFR: 111.28 mL/min (ref >60.00)
Glucose, Bld: 69 mg/dL — ABNORMAL LOW (ref 70–99)
Potassium: 3.7 mEq/L (ref 3.5–5.1)
Sodium: 139 mEq/L (ref 135–145)
Total Bilirubin: 0.2 mg/dL (ref 0.2–1.2)
Total Protein: 6.6 g/dL (ref 6.0–8.3)

## 2022-09-21 LAB — CBC
HCT: 39.9 % (ref 36.0–46.0)
Hemoglobin: 13.6 g/dL (ref 12.0–15.0)
MCHC: 34.2 g/dL (ref 30.0–36.0)
MCV: 89.2 fl (ref 78.0–100.0)
Platelets: 397 10*3/uL (ref 150.0–400.0)
RBC: 4.47 Mil/uL (ref 3.87–5.11)
RDW: 13.4 % (ref 11.5–15.5)
WBC: 9.2 10*3/uL (ref 4.0–10.5)

## 2022-09-21 LAB — VITAMIN D 25 HYDROXY (VIT D DEFICIENCY, FRACTURES): VITD: 31.6 ng/mL (ref 30.00–100.00)

## 2022-09-21 LAB — HEMOGLOBIN A1C: Hgb A1c MFr Bld: 5.3 % (ref 4.6–6.5)

## 2022-09-21 MED ORDER — SCOPOLAMINE 1 MG/3DAYS TD PT72: 1.0000 | MEDICATED_PATCH | TRANSDERMAL | 0 refills | Status: DC

## 2022-09-21 NOTE — Assessment & Plan Note (Signed)
Pending A1c today.  Patient does have an appoint with healthy weight and wellness.  We did try getting with injectable medications covered but insurance declined

## 2022-09-21 NOTE — Progress Notes (Signed)
Established Patient Office Visit  Subjective   Patient ID: Heather [name], female    DOB: 17-Dec-1980  Age: 42 y.o. MRN: [id number]  Chief Complaint  Patient presents with   Follow-up    HPI  ADD: Patient was last seen and evaluated on 08/02/2022 states that she felt like her dose of Adderall was not an adequate.  We did titrate patient from Adderall 15 mg Adderall 20 mg daily  Sleep: Like since she stopped smoking and drinking soda she was having some difficulties with sleeplessness.  States that she does have a history anxiety has been worse since she stopped smoking patient has had genetic testing done see what medications are beneficial for her she has tried buspirone and hydroxyzine in the past without relief of sleep or anxiety last office that she was offered to try mirtazapine to help with anxiety and sleep but with the side effect of weight gain patient deferred.  We did try trazodone she is here for follow-up States that she does not like the trazadone. States that she did not like it it makes her feel weird.  Smoking cessation: Patient was seen by her GYN and put on Chantix.  After reading side effects she decided not to take the medications.  Last office visit she has stopped cold [place] have been smoke-free for 1 week on 08/02/2022. Patient states that she is getting more weight since stopping smoking.  This is very frustrating to her.  We did try getting one of the injectable medications approved but insurance declined.  Patient does have an appoint with healthy weight and wellness in February.  Patient states she will go sometimes without eating because of the weight that she is gained.  Informed patient that this is not good practice that she needs to eat 2-3 meals a day to keep metabolic rate as appropriate level.    Review of Systems  Constitutional:  Negative for chills and fever.  Respiratory:  Negative for shortness of breath.   Cardiovascular:  Negative for chest  pain.  Neurological:  Negative for headaches.  Psychiatric/Behavioral:  Positive for depression. The patient has insomnia.       Objective:     BP 118/68   Pulse 75   Ht 5' 1.5" (1.562 m)   Wt 167 lb (75.8 kg)   SpO2 97%   BMI 31.04 kg/m  BP Readings from Last 3 Encounters:  09/21/22 118/68  08/08/22 115/75  08/03/22 111/75   Wt Readings from Last 3 Encounters:  09/21/22 167 lb (75.8 kg)  08/08/22 159 lb (72.1 kg)  08/02/22 160 lb 6 oz (72.7 kg)      Physical Exam Vitals and nursing note reviewed.  Constitutional:      Appearance: Normal appearance.  Cardiovascular:     Rate and Rhythm: Normal rate and regular rhythm.     Heart sounds: Normal heart sounds.  Pulmonary:     Effort: Pulmonary effort is normal.     Breath sounds: Normal breath sounds.  Neurological:     Mental Status: She is alert.      No results found for any visits on 09/21/22.    The ASCVD Risk score (Arnett DK, et al., 2019) failed to calculate for the following reasons:   The 2019 ASCVD risk score is only valid for ages 31 to 33    Assessment & Plan:   Problem List Items Addressed This Visit       Other   Generalized anxiety  disorder - Primary    Patient still having anxiety using as needed Klonopin.  Currently on Effexor pending refill at pharmacy as they were out of the medication.  Did also try patient on trazodone for sleep and it did not make her feel well.      Vitamin D deficiency    Patient is well replacement recheck level today.  Pending results      Relevant Orders   VITAMIN D 25 Hydroxy (Vit-D Deficiency, Fractures)   ADHD (attention deficit hyperactivity disorder)    Patient currently maintained on Adderall 20 mg twice daily.  No red flags.  Continue medication as prescribed      Obesity (BMI 30-39.9)    Pending A1c today.  Patient does have an appoint with healthy weight and wellness.  We did try getting with injectable medications covered but insurance  declined      Relevant Orders   CBC   Comprehensive metabolic panel   Hemoglobin A1c   Nausea    Patient is going on a cruise and is scared she will have nausea for the show will write scopolamine patch.  Instructions reviewed       Return in about 3 months (around 12/21/2022) for ADD.    Romilda Garret, NP

## 2022-09-21 NOTE — Assessment & Plan Note (Signed)
Patient still having anxiety using as needed Klonopin.  Currently on Effexor pending refill at pharmacy as they were out of the medication.  Did also try patient on trazodone for sleep and it did not make her feel well.

## 2022-09-21 NOTE — Patient Instructions (Signed)
Nice to see you  Apply the patch 4-6 hours before the cruise and change every 3 days

## 2022-09-21 NOTE — Assessment & Plan Note (Signed)
Patient is going on a cruise and is scared she will have nausea for the show will write scopolamine patch.  Instructions reviewed

## 2022-09-21 NOTE — Assessment & Plan Note (Signed)
Patient is well replacement recheck level today.  Pending results

## 2022-09-21 NOTE — Assessment & Plan Note (Signed)
Patient currently maintained on Adderall 20 mg twice daily.  No red flags.  Continue medication as prescribed

## 2022-09-26 ENCOUNTER — Ambulatory Visit: Payer: Medicaid Other | Admitting: Urology

## 2022-09-26 ENCOUNTER — Encounter: Payer: Self-pay | Admitting: Urology

## 2022-09-26 VITALS — BP 112/69 | HR 71 | Ht 61.5 in | Wt 166.2 lb

## 2022-09-26 DIAGNOSIS — N390 Urinary tract infection, site not specified: Secondary | ICD-10-CM

## 2022-09-26 DIAGNOSIS — R93422 Abnormal radiologic findings on diagnostic imaging of left kidney: Secondary | ICD-10-CM | POA: Diagnosis not present

## 2022-09-26 DIAGNOSIS — Z87440 Personal history of urinary (tract) infections: Secondary | ICD-10-CM

## 2022-09-26 DIAGNOSIS — R109 Unspecified abdominal pain: Secondary | ICD-10-CM | POA: Diagnosis not present

## 2022-09-26 DIAGNOSIS — N200 Calculus of kidney: Secondary | ICD-10-CM

## 2022-09-26 LAB — URINALYSIS, COMPLETE
Bilirubin, UA: NEGATIVE
Glucose, UA: NEGATIVE
Ketones, UA: NEGATIVE
Leukocytes,UA: NEGATIVE
Nitrite, UA: NEGATIVE
Protein,UA: NEGATIVE
Specific Gravity, UA: 1.025 (ref 1.005–1.030)
Urobilinogen, Ur: 0.2 mg/dL (ref 0.2–1.0)
pH, UA: 6 (ref 5.0–7.5)

## 2022-09-26 LAB — MICROSCOPIC EXAMINATION: Epithelial Cells (non renal): >10 /hpf — AB (ref 0–10)

## 2022-09-26 MED ORDER — LIDOCAINE HCL URETHRAL/MUCOSAL 2 % EX GEL
1.0000 | Freq: Once | CUTANEOUS | Status: AC
  Administered 2022-09-26: 1 via URETHRAL

## 2022-09-26 MED ORDER — TRIMETHOPRIM 100 MG PO TABS: 100.0000 mg | ORAL_TABLET | Freq: Every day | ORAL | 11 refills | Status: DC

## 2022-09-26 NOTE — Addendum Note (Signed)
Addended by: Kris Mouton on: 09/26/2022 10:11 AM   Modules accepted: Orders
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
## 2022-09-27 ENCOUNTER — Encounter: Payer: Self-pay | Admitting: Nurse Practitioner

## 2022-09-27 ENCOUNTER — Telehealth: Payer: Self-pay | Admitting: *Deleted

## 2022-09-27 NOTE — Telephone Encounter (Signed)
Prior auth approved for Trimethoprim 100mg  tablet, approved for 09/27/2022-09/27/2023 [id number]  Approval form sent for scanning   Spoke with patient and advised results

## 2022-09-27 NOTE — Telephone Encounter (Signed)
There is no letter in the chart regarding dental work.  Please advise on creation of letter as patient requested.  Please advise on test results/recommendations per patient request.  Thank you!

## 2022-09-28 ENCOUNTER — Ambulatory Visit
Admission: RE | Admit: 2022-09-28 | Discharge: 2022-09-28 | Disposition: A | Discharge status: Home / Self Care | Payer: Medicaid Other | Source: Ambulatory Visit | Attending: Urology | Admitting: Urology

## 2022-09-28 DIAGNOSIS — N200 Calculus of kidney: Secondary | ICD-10-CM | POA: Diagnosis not present

## 2022-09-28 DIAGNOSIS — R109 Unspecified abdominal pain: Secondary | ICD-10-CM | POA: Diagnosis not present

## 2022-09-29 LAB — CULTURE, URINE COMPREHENSIVE

## 2022-09-30 NOTE — Telephone Encounter (Signed)
Can we call the dentist [facility] and [facility] and see what is required for this letter please

## 2022-10-04 NOTE — Telephone Encounter (Signed)
Spoke with [facility]. They state they do not require a letter from [place]. They have only seen patient one time, per their notes her current partial "does not sit well" and cannot be altered, they did recommend a new one but patient has not been back to see them. They do not have any information on her insurance denying a partial. They also stated having "GI issues" is generally not going to cover the medical necessity of needing a partial replaced.   Please advise, thank you!

## 2022-10-05 NOTE — Telephone Encounter (Signed)
We can inform the patient of what the dentist stated

## 2022-10-09 ENCOUNTER — Encounter: Payer: Self-pay | Admitting: Nurse Practitioner

## 2022-10-10 ENCOUNTER — Telehealth (INDEPENDENT_AMBULATORY_CARE_PROVIDER_SITE_OTHER): Payer: Medicaid Other | Admitting: Nurse Practitioner

## 2022-10-10 ENCOUNTER — Encounter: Payer: Self-pay | Admitting: Urology

## 2022-10-10 ENCOUNTER — Encounter: Payer: Self-pay | Admitting: Nurse Practitioner

## 2022-10-10 DIAGNOSIS — U071 COVID-19: Secondary | ICD-10-CM | POA: Diagnosis not present

## 2022-10-10 MED ORDER — NIRMATRELVIR/RITONAVIR (PAXLOVID)TABLET: 3.0000 | ORAL_TABLET | Freq: Two times a day (BID) | ORAL | 0 refills | Status: AC

## 2022-10-10 NOTE — Telephone Encounter (Signed)
Called and scheduled patient a virtual appt with Romilda Garret 10/10/22 @12 :00

## 2022-10-10 NOTE — Patient Instructions (Addendum)
I have sent the paxlovid to the pharmacy You need to quarantine through 10/13/2022. After that if you are feeling better and been fever free for 24 hours you can come out of quarantine but need to wear a mask around others from 10/14/2022 through 10/18/2022

## 2022-10-10 NOTE — Progress Notes (Signed)
Patient ID: Heather [name], female    DOB: 03-10-1981, 42 y.o.   MRN: [id number]  Virtual visit completed through [employer], a video enabled telemedicine application. Due to national recommendations of social distancing due to COVID-19, a virtual visit is felt to be most appropriate for this patient at this time. Reviewed limitations, risks, security and privacy concerns of performing a virtual visit and the availability of in person appointments. I also reviewed that there may be a patient responsible charge related to this service. The patient agreed to proceed.   Patient location: home Provider location: [place] at [facility], office Persons participating in this virtual visit: patient, provider   If any vitals were documented, they were collected by patient at home unless specified below.    LMP 10/04/2022 (Approximate)    CC: Covid 19 Subjective:   HPI: Heather [name] is a 42 y.o. female presenting on 10/10/2022 for Covid Positive (10/09/2022 lightheaded, chills, body ache. Pt states that as the day goes by she is getting worse.) and Medical Management of Chronic Issues    Symptoms stared on Saturday. States that she felt like she was still on the ship. States that she took a covid test on Saturday and it was positive.  Covid vaccine: not up todate Ibuprofen that has helped some Recently went on a curise and states that several people are sick with it per the facebook group.   Relevant past medical, surgical, family and social history reviewed and updated as indicated. Interim medical history since our last visit reviewed. Allergies and medications reviewed and updated. Outpatient Medications Prior to Visit  Medication Sig Dispense Refill   amphetamine-dextroamphetamine (ADDERALL) 20 MG tablet Take 1 tablet (20 mg total) by mouth 2 (two) times daily. 60 tablet 0   clonazePAM (KLONOPIN) 0.5 MG tablet Take 0.5 tablets (0.25 mg total) by mouth daily as needed for anxiety. 10  tablet 0   cyanocobalamin (,VITAMIN B-12,) 1000 MCG/ML injection 1000 mcg (1 mg) injection once per month. 1 mL 3   L-Methylfolate 15 MG TABS Take 1 tablet (15 mg total) by mouth 3 (three) times a week. 30 tablet 1   scopolamine (TRANSDERM-SCOP) 1 MG/3DAYS Place 1 patch (1.5 mg total) onto the skin every 3 (three) days. 10 patch 0   traZODone (DESYREL) 50 MG tablet Take 0.5-1 tablets (25-50 mg total) by mouth at bedtime as needed for sleep. 30 tablet 0   trimethoprim (TRIMPEX) 100 MG tablet Take 1 tablet (100 mg total) by mouth daily. 30 tablet 11   venlafaxine XR (EFFEXOR-XR) 75 MG 24 hr capsule Take 75 mg by mouth daily.     Vitamin D, Ergocalciferol, (DRISDOL) 1.25 MG (50000 UNIT) CAPS capsule Take 1 capsule (50,000 Units total) by mouth every 7 (seven) days. 12 capsule 0   No facility-administered medications prior to visit.     Per HPI unless specifically indicated in ROS section below Review of Systems  Constitutional:  Positive for appetite change, chills and fatigue. Negative for fever.       Fluids intake decrease  HENT:  Negative for ear discharge, ear pain and sore throat.   Respiratory:  Positive for cough. Negative for shortness of breath.   Gastrointestinal:  Positive for diarrhea and nausea. Negative for abdominal pain and vomiting.  Musculoskeletal:  Positive for myalgias.  Neurological:  Positive for dizziness and headaches.   Objective:  LMP 10/04/2022 (Approximate)   Wt Readings from Last 3 Encounters:  09/26/22 166 lb 3.2 oz (  75.4 kg)  09/21/22 167 lb (75.8 kg)  08/08/22 159 lb (72.1 kg)       Physical exam: Gen: alert, NAD, not ill appearing Pulm: speaks in complete sentences without increased work of breathing Psych: normal mood, normal thought content      Results for orders placed or performed in visit on 09/26/22  CULTURE, URINE COMPREHENSIVE   Specimen: Urine   UR  Result Value Ref Range   Urine Culture, Comprehensive Final report    Organism ID,  Bacteria Comment   Microscopic Examination   Urine  Result Value Ref Range   WBC, UA 0-5 0 - 5 /hpf   RBC, Urine 3-10 (A) 0 - 2 /hpf   Epithelial Cells (non renal) >10 (A) 0 - 10 /hpf   Bacteria, UA Few None seen/Few  Urinalysis, Complete  Result Value Ref Range   Specific Gravity, UA 1.025 1.005 - 1.030   pH, UA 6.0 5.0 - 7.5   Color, UA Yellow Yellow   Appearance Ur Clear Clear   Leukocytes,UA Negative Negative   Protein,UA Negative Negative/Trace   Glucose, UA Negative Negative   Ketones, UA Negative Negative   RBC, UA 1+ (A) Negative   Bilirubin, UA Negative Negative   Urobilinogen, Ur 0.2 0.2 - 1.0 mg/dL   Nitrite, UA Negative Negative   Microscopic Examination See below:    Assessment & Plan:   COVID-19 -     nirmatrelvir/ritonavir; Take 3 tablets by mouth 2 (two) times daily for 5 days. (Take nirmatrelvir 150 mg two tablets twice daily for 5 days and ritonavir 100 mg one tablet twice daily for 5 days) Patient GFR is 111  Dispense: 30 tablet; Refill: 0     I discussed the assessment and treatment plan with the patient. The patient was provided an opportunity to ask questions and all were answered. The patient agreed with the plan and demonstrated an understanding of the instructions. The patient was advised to call back or seek an in-person evaluation if the symptoms worsen or if the condition fails to improve as anticipated.  Follow up plan: Return if symptoms worsen or fail to improve.  Romilda Garret, NP

## 2022-10-10 NOTE — Telephone Encounter (Signed)
Please advise on CT   IMPRESSION: Bilateral nonobstructive nephrolithiasis with a stone in the right measuring 2-3 mm and the stone in the left measuring 5-6 mm. No obstructive ureteral or bladder calculi.

## 2022-10-14 ENCOUNTER — Other Ambulatory Visit: Payer: Self-pay | Admitting: Nurse Practitioner

## 2022-10-14 DIAGNOSIS — F411 Generalized anxiety disorder: Secondary | ICD-10-CM

## 2022-10-14 DIAGNOSIS — E559 Vitamin D deficiency, unspecified: Secondary | ICD-10-CM

## 2022-10-14 DIAGNOSIS — F909 Attention-deficit hyperactivity disorder, unspecified type: Secondary | ICD-10-CM

## 2022-10-14 MED ORDER — CLONAZEPAM 0.5 MG PO TABS: 0.2500 mg | ORAL_TABLET | Freq: Every day | ORAL | 0 refills | Status: DC | PRN

## 2022-10-14 MED ORDER — AMPHETAMINE-DEXTROAMPHETAMINE 20 MG PO TABS: 20.0000 mg | ORAL_TABLET | Freq: Two times a day (BID) | ORAL | 0 refills | Status: DC

## 2022-10-21 ENCOUNTER — Encounter: Payer: Self-pay | Admitting: Nurse Practitioner

## 2022-10-21 NOTE — Progress Notes (Signed)
Encounter to draft letter for her dentist about her partial that is ill fitting

## 2022-10-21 NOTE — Telephone Encounter (Signed)
Called [facility] in [place] and spoke with Langley Gauss that verified that the patients partial is ill fitting. I will write a note to submit to patients insurance   [facility]in[facility] Simmonds and [employer] number [phone] Fax # [phone]

## 2022-10-25 ENCOUNTER — Other Ambulatory Visit: Payer: Self-pay | Admitting: Nurse Practitioner

## 2022-10-25 DIAGNOSIS — E559 Vitamin D deficiency, unspecified: Secondary | ICD-10-CM

## 2022-11-03 ENCOUNTER — Encounter (INDEPENDENT_AMBULATORY_CARE_PROVIDER_SITE_OTHER): Payer: Medicaid Other | Admitting: Family Medicine

## 2022-11-09 ENCOUNTER — Encounter: Payer: Self-pay | Admitting: Nurse Practitioner

## 2022-11-09 DIAGNOSIS — L989 Disorder of the skin and subcutaneous tissue, unspecified: Secondary | ICD-10-CM

## 2022-11-09 NOTE — Telephone Encounter (Signed)
Called and they said there is nothing else they needed unless I wanted to send last office which I will fax over to them.

## 2022-11-09 NOTE — Telephone Encounter (Signed)
Can we call the dermatologist and see what if anything they are requiring from me

## 2022-11-10 NOTE — Telephone Encounter (Signed)
Note faxed to [phone]

## 2022-11-16 ENCOUNTER — Other Ambulatory Visit: Payer: Self-pay | Admitting: Nurse Practitioner

## 2022-11-16 DIAGNOSIS — F411 Generalized anxiety disorder: Secondary | ICD-10-CM

## 2022-11-16 DIAGNOSIS — F909 Attention-deficit hyperactivity disorder, unspecified type: Secondary | ICD-10-CM

## 2022-11-16 DIAGNOSIS — E559 Vitamin D deficiency, unspecified: Secondary | ICD-10-CM

## 2022-11-16 NOTE — Addendum Note (Signed)
Addended by: Michela Pitcher on: 11/16/2022 07:51 AM   Modules accepted: Orders

## 2022-11-17 MED ORDER — CLONAZEPAM 0.5 MG PO TABS: 0.2500 mg | ORAL_TABLET | Freq: Every day | ORAL | 0 refills | Status: DC | PRN

## 2022-11-17 MED ORDER — VITAMIN D (ERGOCALCIFEROL) 1.25 MG (50000 UNIT) PO CAPS: 50000.0000 [IU] | ORAL_CAPSULE | ORAL | 0 refills | Status: DC

## 2022-11-17 MED ORDER — AMPHETAMINE-DEXTROAMPHETAMINE 20 MG PO TABS: 20.0000 mg | ORAL_TABLET | Freq: Two times a day (BID) | ORAL | 0 refills | Status: DC

## 2022-11-17 MED ORDER — VENLAFAXINE HCL ER 75 MG PO CP24: 75.0000 mg | ORAL_CAPSULE | Freq: Every day | ORAL | 1 refills | Status: DC

## 2022-11-18 ENCOUNTER — Encounter: Payer: Self-pay | Admitting: *Deleted

## 2022-11-23 ENCOUNTER — Encounter: Payer: Self-pay | Admitting: Nurse Practitioner

## 2022-11-23 ENCOUNTER — Telehealth (INDEPENDENT_AMBULATORY_CARE_PROVIDER_SITE_OTHER): Payer: Medicaid Other | Admitting: Nurse Practitioner

## 2022-11-23 VITALS — HR 81 | Temp 97.7°F | Ht 61.5 in | Wt 166.0 lb

## 2022-11-23 DIAGNOSIS — K8020 Calculus of gallbladder without cholecystitis without obstruction: Secondary | ICD-10-CM

## 2022-11-23 DIAGNOSIS — K219 Gastro-esophageal reflux disease without esophagitis: Secondary | ICD-10-CM | POA: Diagnosis not present

## 2022-11-23 MED ORDER — PANTOPRAZOLE SODIUM 40 MG PO TBEC: 40.0000 mg | DELAYED_RELEASE_TABLET | Freq: Every day | ORAL | 0 refills | Status: DC

## 2022-11-23 NOTE — Assessment & Plan Note (Signed)
Patient states she has not heard from the surgeon as of yet.  States she had several referrals encourage patient to contact [facility] herself

## 2022-11-23 NOTE — Assessment & Plan Note (Signed)
Patient has tried Tums over-the-counter along with omeprazole.  Will do Protonix 40 mg daily for 30 days then stepdown to 20 mg for 2 months and can wean her off.  She does not drink alcohol try to avoid NSAIDs is much as possible

## 2022-11-23 NOTE — Progress Notes (Signed)
Patient ID: Heather [name], female    DOB: 1981/03/28, 42 y.o.   MRN: [id number]  Virtual visit completed through [employer], a video enabled telemedicine application. Due to national recommendations of social distancing due to COVID-19, a virtual visit is felt to be most appropriate for this patient at this time. Reviewed limitations, risks, security and privacy concerns of performing a virtual visit and the availability of in person appointments. I also reviewed that there may be a patient responsible charge related to this service. The patient agreed to proceed.   Patient location: home Provider location: [place] [facility], office Persons participating in this virtual visit: patient, provider   If any vitals were documented, they were collected by patient at home unless specified below.    Pulse 81   Temp 97.7 F (36.5 C)   Ht 5' 1.5" (1.562 m)   Wt 166 lb (75.3 kg)   LMP 10/25/2022   SpO2 97%   BMI 30.86 kg/m    CC: heart burn  Subjective:   HPI: Heather [name] is a 42 y.o. female presenting on 11/23/2022 for Heartburn (To long nothing OTC is helping)    Symptoms started approx over the last few weeks  States that she has taken tums, and purple bottle that begins with an "o" that has not helped. States that this is different than a galbladder attack  States that reclining in the recline in the recliner makes it worse. States that when she lays flat it does not help either.  States that it makes her want to throw up . States that she has not noticed that certain food s that make a difference. States that she has limited the spicy foods. States that she has been having more burping.   States that she has not taken any of the ibuprofen since her dx with covid. States that she has been taking goody powders daily     Relevant past medical, surgical, family and social history reviewed and updated as indicated. Interim medical history since our last visit  reviewed. Allergies and medications reviewed and updated. Outpatient Medications Prior to Visit  Medication Sig Dispense Refill   amphetamine-dextroamphetamine (ADDERALL) 20 MG tablet Take 1 tablet (20 mg total) by mouth 2 (two) times daily. 60 tablet 0   clonazePAM (KLONOPIN) 0.5 MG tablet Take 0.5 tablets (0.25 mg total) by mouth daily as needed for anxiety. 10 tablet 0   cyanocobalamin (,VITAMIN B-12,) 1000 MCG/ML injection 1000 mcg (1 mg) injection once per month. 1 mL 3   ibuprofen (ADVIL) 800 MG tablet Take 800 mg by mouth every 8 (eight) hours as needed.     L-Methylfolate 15 MG TABS Take 1 tablet (15 mg total) by mouth 3 (three) times a week. 30 tablet 1   scopolamine (TRANSDERM-SCOP) 1 MG/3DAYS Place 1 patch (1.5 mg total) onto the skin every 3 (three) days. 10 patch 0   traZODone (DESYREL) 50 MG tablet Take 0.5-1 tablets (25-50 mg total) by mouth at bedtime as needed for sleep. 30 tablet 0   trimethoprim (TRIMPEX) 100 MG tablet Take 1 tablet (100 mg total) by mouth daily. 30 tablet 11   venlafaxine XR (EFFEXOR-XR) 75 MG 24 hr capsule Take 1 capsule (75 mg total) by mouth daily. 90 capsule 1   Vitamin D, Ergocalciferol, (DRISDOL) 1.25 MG (50000 UNIT) CAPS capsule Take 1 capsule (50,000 Units total) by mouth every 7 (seven) days. 12 capsule 0   No facility-administered medications prior to visit.  Per HPI unless specifically indicated in ROS section below Review of Systems  Constitutional:  Negative for chills and fever.  Respiratory:  Negative for shortness of breath.   Cardiovascular:  Negative for chest pain.  Gastrointestinal:  Negative for nausea and vomiting.   Objective:  Pulse 81   Temp 97.7 F (36.5 C)   Ht 5' 1.5" (1.562 m)   Wt 166 lb (75.3 kg)   LMP 10/25/2022   SpO2 97%   BMI 30.86 kg/m   Wt Readings from Last 3 Encounters:  11/23/22 166 lb (75.3 kg)  09/26/22 166 lb 3.2 oz (75.4 kg)  09/21/22 167 lb (75.8 kg)       Physical exam: Gen: alert, NAD,  not ill appearing Pulm: speaks in complete sentences without increased work of breathing Psych: normal mood, normal thought content      Results for orders placed or performed in visit on 09/26/22  CULTURE, URINE COMPREHENSIVE   Specimen: Urine   UR  Result Value Ref Range   Urine Culture, Comprehensive Final report    Organism ID, Bacteria Comment   Microscopic Examination   Urine  Result Value Ref Range   WBC, UA 0-5 0 - 5 /hpf   RBC, Urine 3-10 (A) 0 - 2 /hpf   Epithelial Cells (non renal) >10 (A) 0 - 10 /hpf   Bacteria, UA Few None seen/Few  Urinalysis, Complete  Result Value Ref Range   Specific Gravity, UA 1.025 1.005 - 1.030   pH, UA 6.0 5.0 - 7.5   Color, UA Yellow Yellow   Appearance Ur Clear Clear   Leukocytes,UA Negative Negative   Protein,UA Negative Negative/Trace   Glucose, UA Negative Negative   Ketones, UA Negative Negative   RBC, UA 1+ (A) Negative   Bilirubin, UA Negative Negative   Urobilinogen, Ur 0.2 0.2 - 1.0 mg/dL   Nitrite, UA Negative Negative   Microscopic Examination See below:    Assessment & Plan:   Calculus of gallbladder without cholecystitis without obstruction Assessment & Plan: Patient states she has not heard from the surgeon as of yet.  States she had several referrals encourage patient to contact [place] [facility] herself   Gastroesophageal reflux disease, unspecified whether esophagitis present Assessment & Plan: Patient has tried Tums over-the-counter along with omeprazole.  Will do Protonix 40 mg daily for 30 days then stepdown to 20 mg for 2 months and can wean her off.  She does not drink alcohol try to avoid NSAIDs is much as possible  Orders: -     Pantoprazole Sodium; Take 1 tablet (40 mg total) by mouth daily.  Dispense: 30 tablet; Refill: 0     I discussed the assessment and treatment plan with the patient. The patient was provided an opportunity to ask questions and all were answered. The patient agreed with the  plan and demonstrated an understanding of the instructions. The patient was advised to call back or seek an in-person evaluation if the symptoms worsen or if the condition fails to improve as anticipated.  Follow up plan: Return if symptoms worsen or fail to improve, for as scheduled.  Romilda Garret, NP

## 2022-11-23 NOTE — Patient Instructions (Signed)
Nice to see you today Take medication as prescribed Follow up as scheduled

## 2022-11-24 DIAGNOSIS — D2262 Melanocytic nevi of left upper limb, including shoulder: Secondary | ICD-10-CM | POA: Diagnosis not present

## 2022-11-24 DIAGNOSIS — D2272 Melanocytic nevi of left lower limb, including hip: Secondary | ICD-10-CM | POA: Diagnosis not present

## 2022-11-24 DIAGNOSIS — L811 Chloasma: Secondary | ICD-10-CM | POA: Diagnosis not present

## 2022-11-24 DIAGNOSIS — D225 Melanocytic nevi of trunk: Secondary | ICD-10-CM | POA: Diagnosis not present

## 2022-11-24 DIAGNOSIS — D2261 Melanocytic nevi of right upper limb, including shoulder: Secondary | ICD-10-CM | POA: Diagnosis not present

## 2022-11-24 DIAGNOSIS — D2271 Melanocytic nevi of right lower limb, including hip: Secondary | ICD-10-CM | POA: Diagnosis not present

## 2022-11-24 DIAGNOSIS — L821 Other seborrheic keratosis: Secondary | ICD-10-CM | POA: Diagnosis not present

## 2022-12-05 DIAGNOSIS — M5416 Radiculopathy, lumbar region: Secondary | ICD-10-CM | POA: Diagnosis not present

## 2022-12-08 ENCOUNTER — Other Ambulatory Visit: Payer: Self-pay | Admitting: Nurse Practitioner

## 2022-12-08 DIAGNOSIS — F411 Generalized anxiety disorder: Secondary | ICD-10-CM

## 2022-12-09 MED ORDER — CLONAZEPAM 0.5 MG PO TABS: 0.2500 mg | ORAL_TABLET | Freq: Every day | ORAL | 0 refills | Status: DC | PRN

## 2022-12-20 ENCOUNTER — Ambulatory Visit: Payer: Medicaid Other | Admitting: Internal Medicine

## 2022-12-21 ENCOUNTER — Ambulatory Visit: Payer: Medicaid Other | Admitting: Nurse Practitioner

## 2022-12-21 ENCOUNTER — Encounter: Payer: Self-pay | Admitting: Internal Medicine

## 2022-12-21 ENCOUNTER — Ambulatory Visit: Payer: Medicaid Other | Admitting: Internal Medicine

## 2022-12-21 VITALS — BP 100/68 | HR 88 | Temp 98.1°F | Ht 61.5 in | Wt 172.0 lb

## 2022-12-21 DIAGNOSIS — R0981 Nasal congestion: Secondary | ICD-10-CM | POA: Diagnosis not present

## 2022-12-21 DIAGNOSIS — J069 Acute upper respiratory infection, unspecified: Secondary | ICD-10-CM

## 2022-12-21 LAB — POC COVID19 BINAXNOW: SARS Coronavirus 2 Ag: NEGATIVE

## 2022-12-21 NOTE — Assessment & Plan Note (Addendum)
COVID here negative Ears look okay--not sure why her sister saw something white (?reflection) Discussed symptom relief---analgesics If worsens next week--would try empiric antibiotic OOW till tomorrow or Friday
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
## 2022-12-22 ENCOUNTER — Ambulatory Visit (INDEPENDENT_AMBULATORY_CARE_PROVIDER_SITE_OTHER): Payer: Medicaid Other | Admitting: Internal Medicine

## 2022-12-22 ENCOUNTER — Encounter: Payer: Self-pay | Admitting: Nurse Practitioner

## 2022-12-22 ENCOUNTER — Encounter (INDEPENDENT_AMBULATORY_CARE_PROVIDER_SITE_OTHER): Payer: Self-pay | Admitting: Internal Medicine

## 2022-12-22 ENCOUNTER — Telehealth (INDEPENDENT_AMBULATORY_CARE_PROVIDER_SITE_OTHER): Payer: Medicaid Other | Admitting: Nurse Practitioner

## 2022-12-22 VITALS — BP 99/66 | Temp 98.1°F | Resp 16 | Ht 62.0 in | Wt 167.0 lb

## 2022-12-22 VITALS — BP 99/66 | HR 76 | Temp 98.1°F | Ht 62.0 in | Wt 167.0 lb

## 2022-12-22 DIAGNOSIS — F439 Reaction to severe stress, unspecified: Secondary | ICD-10-CM | POA: Diagnosis not present

## 2022-12-22 DIAGNOSIS — Z6830 Body mass index (BMI) 30.0-30.9, adult: Secondary | ICD-10-CM

## 2022-12-22 DIAGNOSIS — E559 Vitamin D deficiency, unspecified: Secondary | ICD-10-CM

## 2022-12-22 DIAGNOSIS — K219 Gastro-esophageal reflux disease without esophagitis: Secondary | ICD-10-CM | POA: Diagnosis not present

## 2022-12-22 DIAGNOSIS — E669 Obesity, unspecified: Secondary | ICD-10-CM

## 2022-12-22 DIAGNOSIS — F909 Attention-deficit hyperactivity disorder, unspecified type: Secondary | ICD-10-CM

## 2022-12-22 MED ORDER — PANTOPRAZOLE SODIUM 20 MG PO TBEC: 20.0000 mg | DELAYED_RELEASE_TABLET | Freq: Every day | ORAL | 1 refills | Status: DC

## 2022-12-22 MED ORDER — LISDEXAMFETAMINE DIMESYLATE 50 MG PO CAPS: 50.0000 mg | ORAL_CAPSULE | Freq: Every day | ORAL | 0 refills | Status: DC

## 2022-12-22 NOTE — Assessment & Plan Note (Signed)
Patient currently maintained on Protonix 40 mg and doing well.  He will continue with the plan to decrease patient to Protonix 20 mg for 2 months.  New prescription sent to pharmacy.  The plan will be to wean off thereafter

## 2022-12-22 NOTE — Assessment & Plan Note (Addendum)
Contributing to disruption of sleep and weight gain.  Counseling and support provided.

## 2022-12-22 NOTE — Assessment & Plan Note (Signed)
Patient states the medication is not as effective as she thought it once was.  States her boss and her husband both have noticed a change at least over the past month.  Discussed the possibility of going to Adderall 30 mg twice daily but patient's anxiety has increased with dose increase.  She has tried Adderall XR in the past without great benefit.  Will try Vyvanse 50 mg once daily.  Pending insurance approval.  We did discuss the possibility of doing Adderall 30 IR in the morning and then a lower dose IR in the afternoon possibly 20 mg.  Told patient is unsure if insurance will pay for that.  Vyvanse 50 mg prescription sent to pharmacy PDMP reviewed

## 2022-12-22 NOTE — Progress Notes (Addendum)
Ph: [phone]       Fax: [phone]   Patient ID: Heather [name], female    DOB: [birth year], 42 y.o.   MRN: [id number]  Virtual visit completed through [employer], a video enabled telemedicine application. Due to national recommendations of social distancing due to COVID-19, a virtual visit is felt to be most appropriate for this patient at this time. Reviewed limitations, risks, security and privacy concerns of performing a virtual visit and the availability of in person appointments. I also reviewed that there may be a patient responsible charge related to this service. The patient agreed to proceed.   Patient location: home Provider location: Home Persons participating in this virtual visit: patient, provider   If any vitals were documented, they were collected by patient at home unless specified below.    BP 99/66   Temp 98.1 F (36.7 C)   Resp 16   Ht  (1.575 m)   Wt 167 lb (75.8 kg)   LMP 11/23/2022   SpO2 99%   BMI 30.54 kg/m    CC: ADD  Subjective:   HPI: Heather [name] is a 42 y.o. female presenting on 12/22/2022 for ADD    ADD: patient is here for ADD medication recheck. She is currently on adderall  BID. States that she has noticed that she is not able to focus or multi task. States that her colleagues have also noticed it. States that her husband and boss has noticed over the past month.  Patient states that she is not able to focus on the task and listen to somebody when I come up to talk to her.  She has also mentioned that her Klonopin use has increased.  Likely because the dose of the Adderall is increased.  Patient has tried Adderall XR in the past without good relief  GERD: Patient was seen virtually on 11/23/2022 with complaints of reflux that was resistant to over-the-counter Tums and omeprazole.  Patient was placed on Protonix 40 mg and is nearing her first month completion.  Patient states the medication is working well.  We will decrease  dose to Protonix 20 mg and have her on that for 2 months then plan to wean off.  Patient was made aware of plan  Relevant past medical, surgical, family and social history reviewed and updated as indicated. Interim medical history since our last visit reviewed. Allergies and medications reviewed and updated. Outpatient Medications Prior to Visit  Medication Sig Dispense Refill   amphetamine-dextroamphetamine (ADDERALL) 20 MG tablet Take 1 tablet (20 mg total) by mouth 2 (two) times daily. 60 tablet 0   clonazePAM (KLONOPIN) 0.5 MG tablet Take 0.5 tablets (0.25 mg total) by mouth daily as needed for anxiety. 10 tablet 0   cyanocobalamin (,VITAMIN B-12,) 1000 MCG/ML injection 1000 mcg (1 mg) injection once per month. 1 mL 3   trimethoprim (TRIMPEX) 100 MG tablet Take 1 tablet (100 mg total) by mouth daily. 30 tablet 11   Vitamin D, Ergocalciferol, (DRISDOL) 1.25 MG (50000 UNIT) CAPS capsule Take 1 capsule (50,000 Units total) by mouth every 7 (seven) days. 12 capsule 0   pantoprazole (PROTONIX) 40 MG tablet Take 1 tablet (40 mg total) by mouth daily. 30 tablet 0   No facility-administered medications prior to visit.     Per HPI unless specifically indicated in ROS section below Review of Systems  Respiratory:  Negative for shortness of breath.   Cardiovascular:  Negative for chest pain.  Psychiatric/Behavioral:  Positive  for decreased concentration. The patient is nervous/anxious.    Objective:  BP 99/66   Temp 98.1 F (36.7 C)   Resp 16   Ht  (1.575 m)   Wt 167 lb (75.8 kg)   LMP 11/23/2022   SpO2 99%   BMI 30.54 kg/m   Wt Readings from Last 3 Encounters:  12/22/22 167 lb (75.8 kg)  12/22/22 167 lb (75.8 kg)  12/21/22 172 lb (78 kg)       Physical exam: Gen: alert, NAD, not ill appearing Pulm: speaks in complete sentences without increased work of breathing Psych: normal mood, normal thought content      Results for orders placed or performed in visit on 12/21/22   POC COVID-19  Result Value Ref Range   SARS Coronavirus 2 Ag Negative Negative   Assessment & Plan:   Gastroesophageal reflux disease, unspecified whether esophagitis present Assessment & Plan: Patient currently maintained on Protonix 40 mg and doing well.  He will continue with the plan to decrease patient to Protonix 20 mg for 2 months.  New prescription sent to pharmacy.  The plan will be to wean off thereafter  Orders: -     Pantoprazole Sodium; Take 1 tablet (20 mg total) by mouth daily.  Dispense: 30 tablet; Refill: 1  Attention deficit hyperactivity disorder (ADHD), unspecified ADHD type Assessment & Plan: Patient states the medication is not as effective as she thought it once was.  States her boss and her husband both have noticed a change at least over the past month.  Discussed the possibility of going to Adderall 30 mg twice daily but patient's anxiety has increased with dose increase.  She has tried Adderall XR in the past without great benefit.  Will try Vyvanse 50 mg once daily.  Pending insurance approval.  We did discuss the possibility of doing Adderall 30 IR in the morning and then a lower dose IR in the afternoon possibly 20 mg.  Told patient is unsure if insurance will pay for that.  Vyvanse 50 mg prescription sent to pharmacy PDMP reviewed  Orders: -     Lisdexamfetamine Dimesylate; Take 1 capsule (50 mg total) by mouth daily.  Dispense: 30 capsule; Refill: 0     I discussed the assessment and treatment plan with the patient. The patient was provided an opportunity to ask questions and all were answered. The patient agreed with the plan and demonstrated an understanding of the instructions. The patient was advised to call back or seek an in-person evaluation if the symptoms worsen or if the condition fails to improve as anticipated.  Follow up plan: Return in about 3 months (around 03/23/2023) for ADHD medication recheck .  Audria Nine, NP

## 2022-12-22 NOTE — Patient Instructions (Signed)
Nice to see you today I have sent in an updated prescription of the pantoprazole  that you will do for 2 months and then try to come off it completely I have sent in a new prescription for vyvanse .  Follow up with me in 3 months for a recheck

## 2022-12-22 NOTE — Assessment & Plan Note (Signed)
We reviewed weight, biometrics, associated medical conditions and contributing factors with patient. She would benefit from weight loss therapy via a modified calorie, low-carb, high-protein nutritional plan tailored to their REE (resting energy expenditure) which will be determined by indirect calorimetry.  We will also assess for cardiometabolic risk and nutritional derangements via fasting serologies at her next appointment. 
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
## 2022-12-22 NOTE — Assessment & Plan Note (Signed)
Most recent vitamin D levels  Lab Results  Component Value Date   VD25OH 31.60 09/21/2022   VD25OH 23.01 (L) 08/02/2022   VD25OH 27.07 (L) 06/05/2020     Deficiency state associated with adiposity and may result in leptin resistance, weight gain and fatigue. Currently on vitamin D supplementation without any adverse effects.  Plan: Check vitamin D levels at intake appointment.

## 2022-12-25 ENCOUNTER — Encounter: Payer: Self-pay | Admitting: Internal Medicine

## 2022-12-26 MED ORDER — AMOXICILLIN 500 MG PO TABS: 1000.0000 mg | ORAL_TABLET | Freq: Two times a day (BID) | ORAL | 0 refills | Status: AC

## 2023-01-03 ENCOUNTER — Other Ambulatory Visit: Payer: Self-pay | Admitting: Nurse Practitioner

## 2023-01-03 DIAGNOSIS — F411 Generalized anxiety disorder: Secondary | ICD-10-CM

## 2023-01-05 MED ORDER — CLONAZEPAM 0.5 MG PO TABS: 0.2500 mg | ORAL_TABLET | Freq: Every day | ORAL | 0 refills | Status: DC | PRN

## 2023-01-16 ENCOUNTER — Ambulatory Visit: Payer: Medicaid Other | Admitting: Urology

## 2023-01-16 ENCOUNTER — Encounter: Payer: Self-pay | Admitting: Urology

## 2023-01-25 ENCOUNTER — Other Ambulatory Visit: Payer: Self-pay | Admitting: Nurse Practitioner

## 2023-01-25 DIAGNOSIS — F411 Generalized anxiety disorder: Secondary | ICD-10-CM

## 2023-01-25 DIAGNOSIS — F909 Attention-deficit hyperactivity disorder, unspecified type: Secondary | ICD-10-CM

## 2023-01-27 MED ORDER — CLONAZEPAM 0.5 MG PO TABS: 0.2500 mg | ORAL_TABLET | Freq: Every day | ORAL | 0 refills | Status: DC | PRN

## 2023-01-27 MED ORDER — LISDEXAMFETAMINE DIMESYLATE 50 MG PO CAPS: 50.0000 mg | ORAL_CAPSULE | Freq: Every day | ORAL | 0 refills | Status: DC

## 2023-01-28 ENCOUNTER — Telehealth: Payer: Medicaid Other | Admitting: Family Medicine

## 2023-01-28 DIAGNOSIS — R519 Headache, unspecified: Secondary | ICD-10-CM | POA: Diagnosis not present

## 2023-01-28 DIAGNOSIS — W57XXXA Bitten or stung by nonvenomous insect and other nonvenomous arthropods, initial encounter: Secondary | ICD-10-CM

## 2023-01-28 MED ORDER — DOXYCYCLINE HYCLATE 100 MG PO TABS: 100.0000 mg | ORAL_TABLET | Freq: Two times a day (BID) | ORAL | 0 refills | Status: AC

## 2023-01-28 NOTE — Patient Instructions (Signed)
Tick Bite Information, Adult  Ticks are insects that draw blood for food. They climb onto people and animals that brush against the leaves and grasses that they live in. They then bite and attach to the skin. Most ticks are harmless, but some ticks may carry germs that can cause disease. These germs are spread to a person through a bite. To lower your risk of getting a disease from a tick bite, make sure you: Take steps to prevent tick bites. Check for ticks after being outdoors where ticks live. Watch for symptoms of disease if a tick attached to you or if you think a tick bit you. How can I prevent tick bites? Take these steps to help prevent tick bites when you go outdoors in an area where ticks live: Before you go outdoors: Wear long sleeves and long pants to protect your skin from ticks. Wear light-colored clothing so you can see ticks easier. Tuck your pant legs into your socks. Apply insect repellent that has DEET (20% or higher), picaridin, or IR3535 in it to the following areas: Any bare skin. Avoid areas around the eyes and mouth. Edges of clothing, like the top of your boots, the bottom of your pant legs, and your sleeve cuffs. Consider applying an insect repellant that contains permethrin. Follow the instructions on the label. Do not apply permethrin directly to the skin. Instead, apply to the following areas: Clothing and shoes. Outdoor gear and tents. When you are outdoors: Avoid walking through areas with long grass. If you are walking on a trail, stay in the middle of the trail so your skin, hair, and clothing do not touch the bushes. Check for ticks on your clothing, hair, and skin often while you are outdoors. Check again before you go inside. When you go indoors: Check your clothing for ticks. Tumble dry clothes in a dryer on high heat for at least 10 minutes. If clothes are damp, additional time may be needed. If clothes require washing, use hot water. Check your gear and  pets. Shower soon after being outdoors. Check your body for ticks. Do a full body check using a mirror. Be sure to check your scalp, neck, armpits, waist, groin, and joint areas. These are the spots where ticks attach themselves most often. What is the best way to remove a tick?  Remove the tick as soon as possible. Removing it can prevent germs from passing to your body. Do not remove the tick with your bare fingers. Do not try to remove a tick with heat, alcohol, petroleum jelly, or fingernail polish. These things can cause the tick to salivate and regurgitate into your bloodstream, increasing your risk of getting a disease. To remove a tick that is crawling on your skin: Go outside and brush the tick off. Use tape or a lint roller. To remove a tick that is attached to your skin: Wash your hands. If you have gloves, put them on. Use a fine-tipped tweezer, curved forceps, or a tick-removal tool to gently grasp the tick as close to your skin and the tick's head as possible. Gently pull with a steady, upward, and even pressure until the tick lets go. While removing the tick: Take care to keep the tick's head attached to its body. Do not twist or jerk the tick. This can make the tick's head or mouth parts break off and stay in your skin. If this happens, try to remove the mouth parts with tweezers. If you cannot remove them, leave   the area alone and let the skin heal. Do not squeeze or crush the tick's body. This could force disease-carrying fluids from the tick into your body. What should I do after removing a tick? Clean the bite area and your hands with soap and water, rubbing alcohol, or an iodine scrub. If an antiseptic cream or ointment is available, put a small amount on the bite area. Wash and disinfect any tools that you used to remove the tick. How should I dispose of a tick? To dispose of a live tick, use one of these methods: Place it in rubbing alcohol. Place it in a sealed bag  or container, and throw it away. Wrap it tightly in tape, and throw it away. Flush it down the toilet. Where to find more information Centers for Disease Control and Prevention: cdc.gov/ticks [place] Environmental Protection Agency: epa.gov/insect-repellents Contact a health care provider if: You have symptoms of a disease after a tick bite. Symptoms of a tick-borne disease can occur from moments after the tick bites to 30 days after a tick is removed. Symptoms include: Fever or chills. A red rash that makes a circle (bull's-eye rash) in the bite area. Redness and swelling in the bite area. Headache or stiff neck. Muscle, joint, or bone pain. Abnormal tiredness. Numbness in your legs or trouble walking or moving your legs. Tender or swollen lymph glands. Abdominal pain, vomiting, diarrhea, or weight loss. Get help right away if: You are not able to remove a tick. You have muscle weakness or paralysis. Your symptoms get worse or you experience new symptoms. You find an engorged tick on your skin and you are in an area where there is a higher risk of disease from ticks. Summary Ticks may carry germs that can spread to a person through a bite. These germs can cause disease. Wear protective clothing and use insect repellent to prevent tick bites. Follow the instructions on the label. If you find a tick on your body, remove it as soon as possible. If the tick is attached, do not try to remove it with heat, alcohol, petroleum jelly, or fingernail polish. If you have symptoms of a disease after being bitten by a tick, contact a health care provider. This information is not intended to replace advice given to you by your health care provider. Make sure you discuss any questions you have with your health care provider. Document Revised: 11/22/2021 Document Reviewed: 11/22/2021 Elsevier Patient Education  2024 Elsevier Inc.  
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
## 2023-02-04 ENCOUNTER — Other Ambulatory Visit: Payer: Self-pay | Admitting: Nurse Practitioner

## 2023-02-04 DIAGNOSIS — E559 Vitamin D deficiency, unspecified: Secondary | ICD-10-CM

## 2023-02-09 ENCOUNTER — Other Ambulatory Visit: Payer: Self-pay | Admitting: Nurse Practitioner

## 2023-02-09 DIAGNOSIS — F411 Generalized anxiety disorder: Secondary | ICD-10-CM

## 2023-02-09 DIAGNOSIS — E559 Vitamin D deficiency, unspecified: Secondary | ICD-10-CM

## 2023-02-10 ENCOUNTER — Other Ambulatory Visit: Payer: Self-pay | Admitting: Nurse Practitioner

## 2023-02-13 ENCOUNTER — Other Ambulatory Visit: Payer: Self-pay | Admitting: Nurse Practitioner

## 2023-02-13 DIAGNOSIS — F411 Generalized anxiety disorder: Secondary | ICD-10-CM

## 2023-02-23 ENCOUNTER — Other Ambulatory Visit: Payer: Self-pay | Admitting: Nurse Practitioner

## 2023-02-23 DIAGNOSIS — F411 Generalized anxiety disorder: Secondary | ICD-10-CM

## 2023-02-24 MED ORDER — CLONAZEPAM 0.5 MG PO TABS: ORAL_TABLET | ORAL | 0 refills | Status: DC

## 2023-03-01 ENCOUNTER — Encounter: Payer: Self-pay | Admitting: Family Medicine

## 2023-03-01 ENCOUNTER — Ambulatory Visit (INDEPENDENT_AMBULATORY_CARE_PROVIDER_SITE_OTHER): Payer: Medicaid Other | Admitting: Family Medicine

## 2023-03-01 ENCOUNTER — Other Ambulatory Visit (HOSPITAL_COMMUNITY)
Admission: RE | Admit: 2023-03-01 | Discharge: 2023-03-01 | Disposition: A | Discharge status: Home / Self Care | Payer: Medicaid Other | Source: Ambulatory Visit | Attending: Family Medicine | Admitting: Family Medicine

## 2023-03-01 VITALS — BP 105/71 | HR 67 | Wt 158.2 lb

## 2023-03-01 DIAGNOSIS — D069 Carcinoma in situ of cervix, unspecified: Secondary | ICD-10-CM | POA: Insufficient documentation

## 2023-03-01 DIAGNOSIS — N939 Abnormal uterine and vaginal bleeding, unspecified: Secondary | ICD-10-CM

## 2023-03-01 NOTE — Progress Notes (Signed)
   Subjective:    Patient ID: Heather [name] is a 42 y.o. 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 She has quit smoking.She reports some heavy vaginal bleeding. Cycles are regular but lasting longer.  Review of Systems  Constitutional:  Negative for chills and fever.  Respiratory:  Negative for shortness of breath.   Cardiovascular:  Negative for chest pain.  Gastrointestinal:  Negative for abdominal pain, nausea and vomiting.  Genitourinary:  Negative for dysuria.  Skin:  Negative for rash.      Objective:    BP 105/71   Pulse 67   Wt 158 lb 3.2 oz (71.8 kg)   BMI 28.94 kg/m  Physical Exam Exam conducted with a chaperone present.  Constitutional:      General: She is not in acute distress.    Appearance: She is well-developed.  HENT:     Head: Normocephalic and atraumatic.  Eyes:     General: No scleral icterus. Cardiovascular:     Rate and Rhythm: Normal rate.  Pulmonary:     Effort: Pulmonary effort is normal.  Abdominal:     Palpations: Abdomen is soft.  Genitourinary:    Comments: BUS normal, vagina is pink and rugated, cervix is parous without lesion, uterus is small and anteverted, no adnexal mass or tenderness.  Musculoskeletal:     Cervical back: Neck supple.  Skin:    General: Skin is warm and dry.  Neurological:     Mental Status: She is alert and oriented to person, place, and time.         Assessment & Plan:  High grade squamous intraepithelial lesion (HGSIL), grade 3 CIN, on biopsy of cervix - repeat pap today - Plan: Cytology - PAP  Abnormal uterine bleeding - check u/s to see if a cause can be determined. - Plan: US PELVIC COMPLETE WITH TRANSVAGINAL  Return in 6 months (on 08/31/2023).  Reva Bores, MD 03/01/2023 2:12 PM

## 2023-03-01 NOTE — Progress Notes (Signed)
CC: Annual    Patient presents for Annual.  LMP: 02/22/23 Roughly, Lasting longer now   Last pap: Date: 2023-abnormal  Contraception: Not indicated Tubal  Mammogram: Up to date: 12.22.23 STD Screening: None  Flu Vaccine : N/A  CC:  Repeat Pap

## 2023-03-06 LAB — CYTOLOGY - PAP
Comment: NEGATIVE
Diagnosis: NEGATIVE
Diagnosis: REACTIVE
High risk HPV: NEGATIVE

## 2023-03-13 ENCOUNTER — Ambulatory Visit: Payer: Medicaid Other

## 2023-03-15 ENCOUNTER — Ambulatory Visit: Admission: RE | Admit: 2023-03-15 | Payer: Medicaid Other | Source: Ambulatory Visit

## 2023-03-20 ENCOUNTER — Other Ambulatory Visit: Payer: Self-pay | Admitting: Nurse Practitioner

## 2023-03-20 DIAGNOSIS — F411 Generalized anxiety disorder: Secondary | ICD-10-CM

## 2023-03-21 MED ORDER — CLONAZEPAM 0.5 MG PO TABS: ORAL_TABLET | ORAL | 0 refills | Status: DC

## 2023-03-21 NOTE — Telephone Encounter (Signed)
Last filled 02-24-23 #10 Last OV at [facility] 12-21-22 acute Next OV tomorrow (03-22-23) [facility] [place]

## 2023-03-22 ENCOUNTER — Ambulatory Visit: Payer: Medicaid Other | Admitting: Nurse Practitioner

## 2023-03-28 ENCOUNTER — Ambulatory Visit: Payer: Medicaid Other | Admitting: Nurse Practitioner

## 2023-03-28 ENCOUNTER — Encounter: Payer: Self-pay | Admitting: Nurse Practitioner

## 2023-03-28 VITALS — BP 100/62 | HR 76 | Temp 98.6°F | Ht 62.0 in | Wt 161.0 lb

## 2023-03-28 DIAGNOSIS — E538 Deficiency of other specified B group vitamins: Secondary | ICD-10-CM | POA: Diagnosis not present

## 2023-03-28 DIAGNOSIS — F331 Major depressive disorder, recurrent, moderate: Secondary | ICD-10-CM | POA: Diagnosis not present

## 2023-03-28 DIAGNOSIS — K219 Gastro-esophageal reflux disease without esophagitis: Secondary | ICD-10-CM

## 2023-03-28 DIAGNOSIS — F909 Attention-deficit hyperactivity disorder, unspecified type: Secondary | ICD-10-CM

## 2023-03-28 MED ORDER — AMPHETAMINE-DEXTROAMPHETAMINE 20 MG PO TABS: 20.0000 mg | ORAL_TABLET | Freq: Two times a day (BID) | ORAL | 0 refills | Status: DC

## 2023-03-28 MED ORDER — CYANOCOBALAMIN 1000 MCG/ML IJ SOLN: INTRAMUSCULAR | 3 refills | Status: DC

## 2023-03-28 NOTE — Assessment & Plan Note (Signed)
History of the same we had talked about doing therapy in the past but the slots for full looking back she did have a referral that went through and I called and left a voice message she did not get it new referral placed today.

## 2023-03-28 NOTE — Progress Notes (Signed)
Established Patient Office Visit  Subjective   Patient ID: Heather [name], female    DOB: Nov 11, 1980  Age: 42 y.o. MRN: [id number]  Chief Complaint  Patient presents with   Medical Management of Chronic Issues    ADD follow up     HPI  ADD: patinet is currently on vyvanse 50mg  daily. She was on adderall 20 mg BID previously.  States that she was having headache, dry mouth. States that she stopped taking it. States that she would like to go back to the 20mg  Twice a day. States that she felt that it was no as effective as the adderral.  GERD: states that she did well the protonix and it controlls the symtoms. No diet changes with the meds. Aovids red stuff off medication . States that she did not have to do break through meds while on the protonix  Patient also wanted me to know that she is taking 1 Klonopin now instead of 0.5    Review of Systems  Constitutional:  Negative for chills and fever.  Respiratory:  Negative for shortness of breath.   Cardiovascular:  Negative for chest pain.  Neurological:  Negative for headaches.  Psychiatric/Behavioral:  Negative for hallucinations and suicidal ideas.       Objective:     BP 100/62   Pulse 76   Temp 98.6 F (37 C) (Temporal)   Ht 5\' 2"  (1.575 m)   Wt 161 lb (73 kg)   SpO2 97%   BMI 29.45 kg/m  BP Readings from Last 3 Encounters:  03/28/23 100/62  03/01/23 105/71  12/22/22 99/66   Wt Readings from Last 3 Encounters:  03/28/23 161 lb (73 kg)  03/01/23 158 lb 3.2 oz (71.8 kg)  12/22/22 167 lb (75.8 kg)      Physical Exam Vitals and nursing note reviewed.  Constitutional:      Appearance: Normal appearance.  Cardiovascular:     Rate and Rhythm: Normal rate and regular rhythm.     Heart sounds: Normal heart sounds.  Pulmonary:     Effort: Pulmonary effort is normal.     Breath sounds: Normal breath sounds.  Neurological:     Mental Status: She is alert.      No results found for any visits on  03/28/23.    The ASCVD Risk score (Arnett DK, et al., 2019) failed to calculate for the following reasons:   The 2019 ASCVD risk score is only valid for ages 60 to 31    Assessment & Plan:   Problem List Items Addressed This Visit       Digestive   Gastroesophageal reflux disease - Primary    Patient has tried off of Protonix 20 mg and had recurrent symptoms.  Start back on Protonix 20 mg.        Other   Major depressive disorder    History of the same we had talked about doing therapy in the past but the slots for full looking back she did have a referral that went through and I called and left a voice message she did not get it new referral placed today.      Relevant Orders   Ambulatory referral to Psychology   Vitamin B12 deficiency    History of same patient needed refill on injectable medication.  Refill provided today.      Relevant Medications   cyanocobalamin (VITAMIN B12) 1000 MCG/ML injection   ADHD (attention deficit hyperactivity disorder)    Trial  patient on Vyvanse 50 mg daily patient had adverse drug event and stopped taking the medication we will switch patient back to Adderall 20 mg twice daily.  Prescription sent in today PDMP reviewed.  No suspicious activity      Relevant Medications   amphetamine-dextroamphetamine (ADDERALL) 20 MG tablet   amphetamine-dextroamphetamine (ADDERALL) 20 MG tablet   amphetamine-dextroamphetamine (ADDERALL) 20 MG tablet    Return in about 3 months (around 06/28/2023) for ADD/ADHD check .    Audria Nine, NP

## 2023-03-28 NOTE — Assessment & Plan Note (Signed)
Patient has tried off of Protonix 20 mg and had recurrent symptoms.  Start back on Protonix 20 mg.

## 2023-03-28 NOTE — Assessment & Plan Note (Signed)
History of same patient needed refill on injectable medication.  Refill provided today.

## 2023-03-28 NOTE — Assessment & Plan Note (Signed)
Trial patient on Vyvanse 50 mg daily patient had adverse drug event and stopped taking the medication we will switch patient back to Adderall 20 mg twice daily.  Prescription sent in today PDMP reviewed.  No suspicious activity

## 2023-03-28 NOTE — Patient Instructions (Signed)
Nice to see you today I have sent in the adderall.  Follow up with me in 3 months, sooner if you need me

## 2023-03-30 ENCOUNTER — Encounter: Payer: Self-pay | Admitting: *Deleted

## 2023-04-06 ENCOUNTER — Other Ambulatory Visit: Payer: Self-pay | Admitting: Nurse Practitioner

## 2023-04-06 DIAGNOSIS — F411 Generalized anxiety disorder: Secondary | ICD-10-CM

## 2023-04-07 MED ORDER — CLONAZEPAM 0.5 MG PO TABS: 0.5000 mg | ORAL_TABLET | Freq: Every day | ORAL | 0 refills | Status: DC | PRN

## 2023-04-19 ENCOUNTER — Other Ambulatory Visit: Payer: Self-pay | Admitting: Nurse Practitioner

## 2023-04-19 DIAGNOSIS — K219 Gastro-esophageal reflux disease without esophagitis: Secondary | ICD-10-CM

## 2023-04-24 ENCOUNTER — Other Ambulatory Visit: Payer: Self-pay | Admitting: Nurse Practitioner

## 2023-04-24 DIAGNOSIS — F909 Attention-deficit hyperactivity disorder, unspecified type: Secondary | ICD-10-CM

## 2023-04-24 DIAGNOSIS — F411 Generalized anxiety disorder: Secondary | ICD-10-CM

## 2023-04-27 ENCOUNTER — Telehealth: Payer: Self-pay | Admitting: Nurse Practitioner

## 2023-04-27 ENCOUNTER — Other Ambulatory Visit: Payer: Self-pay | Admitting: Nurse Practitioner

## 2023-04-27 ENCOUNTER — Encounter: Payer: Self-pay | Admitting: Family Medicine

## 2023-04-27 DIAGNOSIS — F411 Generalized anxiety disorder: Secondary | ICD-10-CM

## 2023-04-27 MED ORDER — CLONAZEPAM 0.5 MG PO TABS: 0.5000 mg | ORAL_TABLET | Freq: Every day | ORAL | 0 refills | Status: DC | PRN

## 2023-04-27 NOTE — Telephone Encounter (Signed)
Pt called to know status of RX refills amphetamine-dextroamphetamine (ADDERALL) 20 MG tablet and clonazePAM (KLONOPIN) 0.5 MG tablet

## 2023-04-27 NOTE — Telephone Encounter (Signed)
LOV: 03/28/2023  Last fill Date: 04/07/2023  Qty: 15  Refills: 0

## 2023-04-27 NOTE — Telephone Encounter (Signed)
Pt states that she is not able to call in to her pharmacy for adderall script. Her pharmacy says she has to request from [place] first. Pt states that she uses Mychart Refill to request prescriptions.

## 2023-04-27 NOTE — Telephone Encounter (Signed)
Pt states she does not know why KLONOPIN is being denied. Pt requests for Adderall script sent in sooner so she is not out before its time to fill.   LAST APPOINTMENT DATE: 03/28/2023  NEXT APPOINTMENT DATE: 07/03/2023  Klonopin 0.5mg  LAST REFILL: 04/07/23 QTY:#15 0RF   Adderall 20mg  LAST REFILL: 03/28/23 QTY: #60 0RF

## 2023-04-27 NOTE — Telephone Encounter (Signed)
Patient should have  adderral prescriptions on file at the pharmacy she needs to call and speak to someone and not do the automated thing.  Klonopin sent in

## 2023-04-28 NOTE — Telephone Encounter (Signed)
I called the pharmacy and they are filling the adderrall. I send 3 scripts in a time generally she still has one more on file to be filled a month from now

## 2023-05-20 ENCOUNTER — Other Ambulatory Visit: Payer: Self-pay | Admitting: Nurse Practitioner

## 2023-05-20 DIAGNOSIS — K219 Gastro-esophageal reflux disease without esophagitis: Secondary | ICD-10-CM

## 2023-05-31 ENCOUNTER — Other Ambulatory Visit: Payer: Self-pay | Admitting: Nurse Practitioner

## 2023-05-31 DIAGNOSIS — F411 Generalized anxiety disorder: Secondary | ICD-10-CM

## 2023-05-31 DIAGNOSIS — F909 Attention-deficit hyperactivity disorder, unspecified type: Secondary | ICD-10-CM

## 2023-05-31 MED ORDER — CLONAZEPAM 0.5 MG PO TABS: 0.5000 mg | ORAL_TABLET | Freq: Every day | ORAL | 0 refills | Status: DC | PRN

## 2023-06-12 ENCOUNTER — Encounter: Payer: Self-pay | Admitting: Family Medicine

## 2023-06-13 ENCOUNTER — Ambulatory Visit: Payer: Medicaid Other

## 2023-06-20 ENCOUNTER — Ambulatory Visit
Admission: RE | Admit: 2023-06-20 | Discharge: 2023-06-20 | Disposition: A | Discharge status: Home / Self Care | Payer: Medicaid Other | Source: Ambulatory Visit | Attending: Family Medicine | Admitting: Family Medicine

## 2023-06-20 DIAGNOSIS — N939 Abnormal uterine and vaginal bleeding, unspecified: Secondary | ICD-10-CM | POA: Insufficient documentation

## 2023-06-28 ENCOUNTER — Other Ambulatory Visit: Payer: Self-pay | Admitting: Nurse Practitioner

## 2023-06-28 DIAGNOSIS — F411 Generalized anxiety disorder: Secondary | ICD-10-CM

## 2023-06-28 DIAGNOSIS — F909 Attention-deficit hyperactivity disorder, unspecified type: Secondary | ICD-10-CM

## 2023-06-28 MED ORDER — CLONAZEPAM 0.5 MG PO TABS: 0.5000 mg | ORAL_TABLET | Freq: Every day | ORAL | 0 refills | Status: DC | PRN

## 2023-06-28 MED ORDER — AMPHETAMINE-DEXTROAMPHETAMINE 20 MG PO TABS: 20.0000 mg | ORAL_TABLET | Freq: Two times a day (BID) | ORAL | 0 refills | Status: DC

## 2023-07-03 ENCOUNTER — Encounter: Payer: Self-pay | Admitting: *Deleted

## 2023-07-03 ENCOUNTER — Encounter: Payer: Self-pay | Admitting: Nurse Practitioner

## 2023-07-03 ENCOUNTER — Ambulatory Visit (INDEPENDENT_AMBULATORY_CARE_PROVIDER_SITE_OTHER): Payer: Medicaid Other | Admitting: Nurse Practitioner

## 2023-07-03 VITALS — BP 118/78 | HR 77 | Temp 97.9°F | Ht 62.0 in | Wt 164.6 lb

## 2023-07-03 DIAGNOSIS — F909 Attention-deficit hyperactivity disorder, unspecified type: Secondary | ICD-10-CM | POA: Diagnosis not present

## 2023-07-03 DIAGNOSIS — M25511 Pain in right shoulder: Secondary | ICD-10-CM | POA: Insufficient documentation

## 2023-07-03 DIAGNOSIS — M79606 Pain in leg, unspecified: Secondary | ICD-10-CM

## 2023-07-03 DIAGNOSIS — E559 Vitamin D deficiency, unspecified: Secondary | ICD-10-CM | POA: Diagnosis not present

## 2023-07-03 DIAGNOSIS — K8020 Calculus of gallbladder without cholecystitis without obstruction: Secondary | ICD-10-CM

## 2023-07-03 DIAGNOSIS — E538 Deficiency of other specified B group vitamins: Secondary | ICD-10-CM | POA: Diagnosis not present

## 2023-07-03 DIAGNOSIS — E669 Obesity, unspecified: Secondary | ICD-10-CM | POA: Insufficient documentation

## 2023-07-03 DIAGNOSIS — F411 Generalized anxiety disorder: Secondary | ICD-10-CM | POA: Diagnosis not present

## 2023-07-03 LAB — IBC + FERRITIN
Ferritin: 27.3 ng/mL (ref 10.0–291.0)
Iron: 100 ug/dL (ref 42–145)
Saturation Ratios: 29.5 % (ref 20.0–50.0)
TIBC: 338.8 ug/dL (ref 250.0–450.0)
Transferrin: 242 mg/dL (ref 212.0–360.0)

## 2023-07-03 LAB — CBC
HCT: 39.9 % (ref 36.0–46.0)
Hemoglobin: 13.1 g/dL (ref 12.0–15.0)
MCHC: 32.7 g/dL (ref 30.0–36.0)
MCV: 87.6 fL (ref 78.0–100.0)
Platelets: 410 10*3/uL — ABNORMAL HIGH (ref 150.0–400.0)
RBC: 4.56 Mil/uL (ref 3.87–5.11)
RDW: 13.6 % (ref 11.5–15.5)
WBC: 7.5 10*3/uL (ref 4.0–10.5)

## 2023-07-03 LAB — COMPREHENSIVE METABOLIC PANEL
ALT: 10 U/L (ref 0–35)
AST: 12 U/L (ref 0–37)
Albumin: 4.3 g/dL (ref 3.5–5.2)
Alkaline Phosphatase: 72 U/L (ref 39–117)
BUN: 9 mg/dL (ref 6–23)
CO2: 24 meq/L (ref 19–32)
Calcium: 9.2 mg/dL (ref 8.4–10.5)
Chloride: 105 meq/L (ref 96–112)
Creatinine, Ser: 0.68 mg/dL (ref 0.40–1.20)
GFR: 109.06 mL/min (ref >60.00)
Glucose, Bld: 85 mg/dL (ref 70–99)
Potassium: 4.1 meq/L (ref 3.5–5.1)
Sodium: 136 meq/L (ref 135–145)
Total Bilirubin: 0.6 mg/dL (ref 0.2–1.2)
Total Protein: 6.9 g/dL (ref 6.0–8.3)

## 2023-07-03 LAB — VITAMIN D 25 HYDROXY (VIT D DEFICIENCY, FRACTURES): VITD: 33.31 ng/mL (ref 30.00–100.00)

## 2023-07-03 LAB — VITAMIN B12: Vitamin B-12: 208 pg/mL — ABNORMAL LOW (ref 211–911)

## 2023-07-03 MED ORDER — AMPHETAMINE-DEXTROAMPHETAMINE 20 MG PO TABS: 20.0000 mg | ORAL_TABLET | Freq: Two times a day (BID) | ORAL | 0 refills | Status: DC

## 2023-07-03 MED ORDER — WEGOVY 0.25 MG/0.5ML ~~LOC~~ SOAJ
0.2500 mg | SUBCUTANEOUS | 0 refills | Status: DC
Start: 2023-07-03 — End: 2023-07-28

## 2023-07-03 NOTE — Assessment & Plan Note (Signed)
Patient currently maintained on Adderall 20 mg twice daily.  PDMP reviewed no suspicious activity.  Continue medication as prescribed

## 2023-07-03 NOTE — Assessment & Plan Note (Addendum)
History of same.  Patient does give herself at home injections will check level given lower extremity discomforts.  Pending level

## 2023-07-03 NOTE — Assessment & Plan Note (Signed)
Do think she has somewhat of impingement syndrome.  Rehab exercises given via MyChart.  Patient is established with Ortho she can follow-up with them

## 2023-07-03 NOTE — Assessment & Plan Note (Signed)
History of same.  Currently maintained on Klonopin daily.  Patient has been referred to counseling but has not made an appointment.  Did put number on AVS for her to call I think this will be greatly beneficial.  Continue medication as prescribed

## 2023-07-03 NOTE — Patient Instructions (Addendum)
Nice to see you today We will see if insurance covers the wegovy injectable weight loss drug.  I will be in touch with the labs once I have reviewed them You can follow up with ortho about your shoulder  Call [facility] in [place] to set up counseling  [address], [place], [place] [address] Phone: [phone]

## 2023-07-03 NOTE — Assessment & Plan Note (Signed)
Ambiguous in nature.  No red flag symptoms on exam.  Will check patient's iron level vitamin D and B12 levels.

## 2023-07-03 NOTE — Assessment & Plan Note (Signed)
History of same.  Has been since 2021 of last ultrasound.  Patient is having some atypical chest pain and she would like imaging repeated.  Ultrasound placed.  Patient was given information to call and set up ultrasound.

## 2023-07-03 NOTE — Assessment & Plan Note (Signed)
Patient is still struggling with her weight.  Will resubmit Wegovy 0.25 mg once weekly.

## 2023-07-03 NOTE — Progress Notes (Signed)
Established Patient Office Visit  Subjective   Patient ID: Heather [name], female    DOB: 1980/11/17  Age: 42 y.o. MRN: [id number]  Chief Complaint  Patient presents with   ADHD    Pt states she has had a rough time lately, "anxiety has been through the roof"     HPI  ADHD: patient is currenlty on adderall 20 mg BId. States taht she is oding fine with the adderall. States that somedays she does lose focus but overall is doing   Obesity: patient is still struggling with her weight. She has done a consultation with healthy weight and wellness in the past. We have tried to get the injectable weight lose drugs approved. This was denied even with an appeal. This was tried prior to the formulary change on her insurance plan   Right shoulder: states that when his son is coming into the bed an he likes to be cuddled. SHe normally sleeps on the left side but has to lay on her right side when she does that. Worse when she wakes. States that some pain with movement but and some through the day.    Leg pain: states that it has been going on for 1.5 months. States that it is at night. Aching pain from the hip down. States that she will get her spouse to rubn her legs.   Anxiety: patient is currently on klonipin and has been referred to counseling. Last referral was [facility] in [place]. She has had genetic testing in regards to medications that she can take and not take in the past . States that since July she has been dealing with family issues. States that September it has gotten wrose  in regards to having to deal with her youngest child having bullying problems at school. She is having to deal with the other Marjo Bicker parents, the school administration. She has involved DSS and law enforcement too. She has been having chest discomfort with this and was seen by EMT and an EKG was done that was normal (she brought it in to the office visit today). She has had to take an extra half klonopin  twice since this has happened. She has been referred for therapy but has not gotten an appointment yet. She is curious if some of the chest discomfort is due to her gallstones and would like a repeat US.     Review of Systems  Constitutional:  Negative for chills and fever.  Respiratory:  Negative for shortness of breath.   Cardiovascular:  Negative for chest pain.  Musculoskeletal:  Positive for joint pain.  Neurological:  Negative for headaches.  Psychiatric/Behavioral:  Negative for hallucinations and suicidal ideas. The patient is nervous/anxious.       Objective:     BP 118/78   Pulse 77   Temp 97.9 F (36.6 C) (Oral)   Ht 5\' 2"  (1.575 m)   Wt 164 lb 9.6 oz (74.7 kg)   SpO2 97%   BMI 30.11 kg/m    Physical Exam Vitals and nursing note reviewed.  Constitutional:      Appearance: Normal appearance.  Cardiovascular:     Rate and Rhythm: Normal rate and regular rhythm.     Pulses: Normal pulses.     Heart sounds: Normal heart sounds.  Pulmonary:     Effort: Pulmonary effort is normal.     Breath sounds: Normal breath sounds.  Musculoskeletal:     Right shoulder: Tenderness present. Normal strength. Normal pulse.  Left shoulder: Normal strength. Normal pulse.       Arms:     Lumbar back: Negative right straight leg raise test and negative left straight leg raise test.     Right lower leg: No edema.     Left lower leg: No edema.     Comments: FROM with pain at the ends of the   Neurological:     Mental Status: She is alert.     Deep Tendon Reflexes:     Reflex Scores:      Bicep reflexes are 2+ on the right side and 2+ on the left side.      Patellar reflexes are 2+ on the right side and 2+ on the left side.    Comments: Bilateral upper and lower extremity strength       No results found for any visits on 07/03/23.    The ASCVD Risk score (Arnett DK, et al., 2019) failed to calculate for the following reasons:   Cannot find a previous HDL lab    Cannot find a previous total cholesterol lab    Assessment & Plan:   Problem List Items Addressed This Visit       Digestive   Calculus of gallbladder without cholecystitis without obstruction    History of same.  Has been since 2021 of last ultrasound.  Patient is having some atypical chest pain and she would like imaging repeated.  Ultrasound placed.  Patient was given information to call and set up ultrasound.      Relevant Orders   US Abdomen Limited RUQ (LIVER/GB)     Other   Generalized anxiety disorder    History of same.  Currently maintained on Klonopin daily.  Patient has been referred to counseling but has not made an appointment.  Did put number on AVS for her to call I think this will be greatly beneficial.  Continue medication as prescribed      Pain of lower extremity - Primary    Ambiguous in nature.  No red flag symptoms on exam.  Will check patient's iron level vitamin D and B12 levels.      Relevant Orders   CBC   Comprehensive metabolic panel   ANA Screen,IFA,Reflex Titer/Pattern,Reflex Mplx 11 Ab Cascade with IdentRA   IBC + Ferritin   Vitamin B12 deficiency    History of same.  Patient does give herself at home injections will check level given lower extremity discomforts.  Pending level      Relevant Orders   Vitamin B12   Vitamin D deficiency    History of the same.  Given lower extremity pain will check levels pending labs today      Relevant Orders   VITAMIN D 25 Hydroxy (Vit-D Deficiency, Fractures)   ADHD (attention deficit hyperactivity disorder)    Patient currently maintained on Adderall 20 mg twice daily.  PDMP reviewed no suspicious activity.  Continue medication as prescribed      Relevant Medications   amphetamine-dextroamphetamine (ADDERALL) 20 MG tablet   amphetamine-dextroamphetamine (ADDERALL) 20 MG tablet   amphetamine-dextroamphetamine (ADDERALL) 20 MG tablet   Obesity (BMI 30-39.9)    Patient is still struggling with her  weight.  Will resubmit Wegovy 0.25 mg once weekly.      Relevant Medications   Semaglutide-Weight Management (WEGOVY) 0.25 MG/0.5ML SOAJ   amphetamine-dextroamphetamine (ADDERALL) 20 MG tablet   amphetamine-dextroamphetamine (ADDERALL) 20 MG tablet   amphetamine-dextroamphetamine (ADDERALL) 20 MG tablet   Acute pain of right shoulder  Do think she has somewhat of impingement syndrome.  Rehab exercises given via MyChart.  Patient is established with Ortho she can follow-up with them       Return in about 3 months (around 10/03/2023) for medication recheck, ADD/ADHD.    Audria Nine, NP

## 2023-07-03 NOTE — Assessment & Plan Note (Signed)
History of the same.  Given lower extremity pain will check levels pending labs today

## 2023-07-05 ENCOUNTER — Telehealth: Payer: Self-pay | Admitting: Pharmacist

## 2023-07-05 NOTE — Telephone Encounter (Signed)
Pharmacy Patient Advocate Encounter   Received notification from Patient Pharmacy that prior authorization for [facility] 0.25MG /0.5ML auto-injectors is required/requested.   Insurance verification completed.   The patient is insured through [facility] .   Per test claim: PA required; PA submitted to above mentioned insurance via CoverMyMeds Key/confirmation #/EOC [id number] Status is pending

## 2023-07-06 ENCOUNTER — Other Ambulatory Visit (HOSPITAL_COMMUNITY): Payer: Self-pay

## 2023-07-06 ENCOUNTER — Encounter: Payer: Self-pay | Admitting: Nurse Practitioner

## 2023-07-06 NOTE — Telephone Encounter (Signed)
Pharmacy Patient Advocate Encounter  Received notification from [facility] that Prior Authorization for [facility] 0.25MG /0.5ML auto-injectors has been APPROVED from 07/06/2023 to 01/02/2024. Ran test claim, Copay is $4.00. This test claim was processed through [facility]- copay amounts may vary at other pharmacies due to pharmacy/plan contracts, or as the patient moves through the different stages of their insurance plan.   PA #/Case ID/Reference #: [id number]

## 2023-07-07 MED ORDER — CYANOCOBALAMIN 1000 MCG/ML IJ SOLN: 1000.0000 ug | INTRAMUSCULAR | 2 refills | Status: AC

## 2023-07-09 LAB — ANA SCREEN,IFA,REFLEX TITER/PATTERN,REFLEX MPLX 11 AB CASCADE
Anti Nuclear Antibody (ANA): NEGATIVE
Cyclic Citrullin Peptide Ab: <16 U
MUTATED CITRULLINATED VIMENTIN (MCV) AB: <20 U/mL (ref <20)
Rheumatoid fact SerPl-aCnc: <10 [IU]/mL (ref <14)

## 2023-07-10 ENCOUNTER — Ambulatory Visit
Admission: RE | Admit: 2023-07-10 | Discharge: 2023-07-10 | Disposition: A | Discharge status: Home / Self Care | Payer: Medicaid Other | Source: Ambulatory Visit | Attending: Nurse Practitioner | Admitting: Nurse Practitioner

## 2023-07-10 DIAGNOSIS — K8020 Calculus of gallbladder without cholecystitis without obstruction: Secondary | ICD-10-CM | POA: Insufficient documentation

## 2023-07-10 DIAGNOSIS — R079 Chest pain, unspecified: Secondary | ICD-10-CM | POA: Diagnosis not present

## 2023-07-12 ENCOUNTER — Encounter: Payer: Self-pay | Admitting: Nurse Practitioner

## 2023-07-13 ENCOUNTER — Other Ambulatory Visit: Payer: Self-pay | Admitting: Nurse Practitioner

## 2023-07-13 DIAGNOSIS — Z1231 Encounter for screening mammogram for malignant neoplasm of breast: Secondary | ICD-10-CM

## 2023-07-27 ENCOUNTER — Other Ambulatory Visit: Payer: Self-pay | Admitting: Nurse Practitioner

## 2023-07-27 DIAGNOSIS — E669 Obesity, unspecified: Secondary | ICD-10-CM

## 2023-07-27 DIAGNOSIS — F909 Attention-deficit hyperactivity disorder, unspecified type: Secondary | ICD-10-CM

## 2023-07-27 DIAGNOSIS — F411 Generalized anxiety disorder: Secondary | ICD-10-CM

## 2023-07-28 ENCOUNTER — Telehealth: Payer: Self-pay | Admitting: Nurse Practitioner

## 2023-07-28 MED ORDER — WEGOVY 0.5 MG/0.5ML ~~LOC~~ SOAJ: 0.5000 mg | SUBCUTANEOUS | 0 refills | Status: DC

## 2023-07-28 NOTE — Telephone Encounter (Signed)
[name] medications are denied, she doesn't know why is due to take them on Monday  Please call this patient at [phone]

## 2023-07-28 NOTE — Telephone Encounter (Signed)
Called patient let her know there were duplicate request that the refill was sent into pharmacy. Patient also asked about adderall. Let her know that 3 scripts were called in October. She will need to call pharmacy and request refill from one on hold. She will reach out if any questions.

## 2023-08-09 ENCOUNTER — Encounter: Payer: Self-pay | Admitting: Family Medicine

## 2023-08-09 ENCOUNTER — Ambulatory Visit: Payer: Medicaid Other | Admitting: Family Medicine

## 2023-08-09 VITALS — BP 126/85 | HR 91 | Wt 160.4 lb

## 2023-08-09 DIAGNOSIS — Z30430 Encounter for insertion of intrauterine contraceptive device: Secondary | ICD-10-CM

## 2023-08-09 MED ORDER — LEVONORGESTREL 20 MCG/DAY IU IUD
1.0000 | INTRAUTERINE_SYSTEM | Freq: Once | INTRAUTERINE | Status: AC
  Administered 2023-08-09: 1 via INTRAUTERINE

## 2023-08-09 NOTE — Progress Notes (Signed)
CC: Follow up---had Ultra Sound  06/20/23     IUD  Today ---

## 2023-08-09 NOTE — Progress Notes (Signed)
   Subjective:    Patient ID: Heather [name] is a 42 y.o. female presenting with Follow-up  on 08/09/2023  HPI: Having abnormal bleeding. She is going through a box a super tampons very frequently.  She is feeling a bulge. Has h/o BTL and cycles have been heavy since. Has h/o UTI.  Review of Systems  Constitutional:  Negative for chills and fever.  Eyes:  Negative for visual disturbance.  Respiratory:  Negative for shortness of breath.   Cardiovascular:  Negative for chest pain.  Gastrointestinal:  Negative for abdominal pain, nausea and vomiting.  Genitourinary:  Negative for dysuria.  Skin:  Negative for rash.  Psychiatric/Behavioral:  Negative for sleep disturbance.   All other systems reviewed and are negative.     Objective:    BP 126/85   Pulse 91   Wt 160 lb 6.4 oz (72.8 kg)   BMI 29.34 kg/m  Physical Exam Exam conducted with a chaperone present.  Constitutional:      General: She is not in acute distress.    Appearance: She is well-developed.  HENT:     Head: Normocephalic and atraumatic.  Eyes:     General: No scleral icterus. Cardiovascular:     Rate and Rhythm: Normal rate.  Pulmonary:     Effort: Pulmonary effort is normal.  Abdominal:     Palpations: Abdomen is soft.  Musculoskeletal:     Cervical back: Neck supple.  Skin:    General: Skin is warm and dry.  Neurological:     Mental Status: She is alert and oriented to person, place, and time.   Procedure: Patient identified, informed consent performed, signed copy in chart, time out was performed.  Urine pregnancy test negative.  Speculum placed in the vagina.  Cervix visualized.  Cleaned with Betadine x 2.  Grasped anteriourly with a single tooth tenaculum.  Uterus sounded to 9 cm.  Mirena IUD placed per manufacturer's recommendations.  Strings trimmed to 3 cm.   U/A negative     Assessment & Plan:  Encounter for IUD insertion - s/p mirena for abnormal bleeding   Return in about 6 months  (around 02/07/2024) for repeat pap, a CPE.  Reva Bores, MD 08/09/2023 11:23 AM

## 2023-08-22 ENCOUNTER — Other Ambulatory Visit: Payer: Self-pay | Admitting: Nurse Practitioner

## 2023-08-22 DIAGNOSIS — F909 Attention-deficit hyperactivity disorder, unspecified type: Secondary | ICD-10-CM

## 2023-08-22 DIAGNOSIS — K219 Gastro-esophageal reflux disease without esophagitis: Secondary | ICD-10-CM

## 2023-08-22 DIAGNOSIS — E559 Vitamin D deficiency, unspecified: Secondary | ICD-10-CM

## 2023-08-22 DIAGNOSIS — E669 Obesity, unspecified: Secondary | ICD-10-CM

## 2023-08-22 DIAGNOSIS — F411 Generalized anxiety disorder: Secondary | ICD-10-CM

## 2023-08-23 ENCOUNTER — Other Ambulatory Visit: Payer: Self-pay | Admitting: Nurse Practitioner

## 2023-08-23 DIAGNOSIS — F411 Generalized anxiety disorder: Secondary | ICD-10-CM

## 2023-08-23 DIAGNOSIS — E669 Obesity, unspecified: Secondary | ICD-10-CM

## 2023-08-24 ENCOUNTER — Encounter: Payer: Self-pay | Admitting: Nurse Practitioner

## 2023-08-24 DIAGNOSIS — F411 Generalized anxiety disorder: Secondary | ICD-10-CM

## 2023-08-24 MED ORDER — WEGOVY 1 MG/0.5ML ~~LOC~~ SOAJ: 1.0000 mg | SUBCUTANEOUS | 0 refills | Status: DC

## 2023-08-25 ENCOUNTER — Other Ambulatory Visit: Payer: Self-pay | Admitting: Nurse Practitioner

## 2023-08-25 DIAGNOSIS — F411 Generalized anxiety disorder: Secondary | ICD-10-CM

## 2023-08-25 MED ORDER — CLONAZEPAM 0.5 MG PO TABS
0.5000 mg | ORAL_TABLET | Freq: Every day | ORAL | 0 refills | Status: DC | PRN
Start: 2023-08-25 — End: 2023-09-24

## 2023-08-25 NOTE — Addendum Note (Signed)
Addended by: Eden Emms on: 08/25/2023 01:02 PM   Modules accepted: Orders

## 2023-08-29 ENCOUNTER — Other Ambulatory Visit: Payer: Self-pay | Admitting: Nurse Practitioner

## 2023-08-29 DIAGNOSIS — K219 Gastro-esophageal reflux disease without esophagitis: Secondary | ICD-10-CM

## 2023-09-11 ENCOUNTER — Ambulatory Visit: Payer: Medicaid Other

## 2023-09-18 ENCOUNTER — Other Ambulatory Visit: Payer: Self-pay | Admitting: Nurse Practitioner

## 2023-09-24 ENCOUNTER — Other Ambulatory Visit: Payer: Self-pay | Admitting: Nurse Practitioner

## 2023-09-24 DIAGNOSIS — F411 Generalized anxiety disorder: Secondary | ICD-10-CM

## 2023-09-24 DIAGNOSIS — F909 Attention-deficit hyperactivity disorder, unspecified type: Secondary | ICD-10-CM

## 2023-09-25 MED ORDER — CLONAZEPAM 0.5 MG PO TABS
0.5000 mg | ORAL_TABLET | Freq: Every day | ORAL | 0 refills | Status: DC | PRN
Start: 2023-09-25 — End: 2023-10-25

## 2023-09-28 ENCOUNTER — Ambulatory Visit: Payer: Medicaid Other

## 2023-10-03 ENCOUNTER — Ambulatory Visit: Payer: Medicaid Other | Admitting: Nurse Practitioner

## 2023-10-03 VITALS — BP 102/74 | HR 75 | Temp 98.1°F | Ht 62.0 in | Wt 158.0 lb

## 2023-10-03 DIAGNOSIS — K8020 Calculus of gallbladder without cholecystitis without obstruction: Secondary | ICD-10-CM

## 2023-10-03 DIAGNOSIS — E663 Overweight: Secondary | ICD-10-CM | POA: Diagnosis not present

## 2023-10-03 DIAGNOSIS — F909 Attention-deficit hyperactivity disorder, unspecified type: Secondary | ICD-10-CM | POA: Diagnosis not present

## 2023-10-03 DIAGNOSIS — F411 Generalized anxiety disorder: Secondary | ICD-10-CM

## 2023-10-03 MED ORDER — AMPHETAMINE-DEXTROAMPHETAMINE 20 MG PO TABS
20.0000 mg | ORAL_TABLET | Freq: Two times a day (BID) | ORAL | 0 refills | Status: DC
Start: 2023-10-03 — End: 2024-01-30

## 2023-10-03 MED ORDER — AMPHETAMINE-DEXTROAMPHETAMINE 20 MG PO TABS: 20.0000 mg | ORAL_TABLET | Freq: Two times a day (BID) | ORAL | 0 refills | Status: DC

## 2023-10-03 MED ORDER — AMPHETAMINE-DEXTROAMPHETAMINE 20 MG PO TABS
20.0000 mg | ORAL_TABLET | Freq: Two times a day (BID) | ORAL | 0 refills | Status: DC
Start: 2023-10-03 — End: 2024-01-22

## 2023-10-03 NOTE — Assessment & Plan Note (Signed)
Patient currently on Wegovy 1.7 mg weekly.  Has taken 1 injection so far.  Patient is lost 6 pounds.  Continue working healthy lifestyle modifications continue [employer].  Plan is to titrate up to 2.4 mg after patient is completed 4 doses of 1.7 mg.

## 2023-10-03 NOTE — Progress Notes (Signed)
Established Patient Office Visit  Subjective   Patient ID: Heather [name], female    DOB: [birth year]  Age: 43 y.o. MRN: [id number]  Chief Complaint  Patient presents with   Follow-up    Pt states she's been doing good.     HPI  Add/ADHD: Patient currently maintained on Adderall 20 mg twice daily. States that she is able to focus at work. States that she did get a promotion. She is office and Investment banker, corporate.  GAD: Patient currently maintained on clonazepam.  States he is doing well with the medication.  Sleeping okay  Obesity: patient is currently on wegovy 1.7mg  weekly.  Patient has lost approximately 6 pounds since inception of medication use.  She has been on 1.7 mg for approximately 1 week. States that she can tell that she has lost weight and able to fit in clothes that she could not receblty. States that with the dose titratoin she will get a headache. She is busy at work and does not eat. States that she is eating a little less     Review of Systems  Constitutional:  Negative for chills and fever.  Respiratory:  Negative for shortness of breath.   Cardiovascular:  Negative for chest pain.  Gastrointestinal:  Positive for nausea. Negative for abdominal pain, constipation, diarrhea and vomiting.  Neurological:  Positive for headaches.  Psychiatric/Behavioral:  Negative for hallucinations and suicidal ideas.       Objective:     BP 102/74   Pulse 75   Temp 98.1 F (36.7 C) (Oral)   Ht 5\' 2"  (1.575 m)   Wt 158 lb (71.7 kg)   SpO2 98%   BMI 28.90 kg/m  BP Readings from Last 3 Encounters:  10/03/23 102/74  08/09/23 126/85  07/03/23 118/78   Wt Readings from Last 3 Encounters:  10/03/23 158 lb (71.7 kg)  08/09/23 160 lb 6.4 oz (72.8 kg)  07/03/23 164 lb 9.6 oz (74.7 kg)   SpO2 Readings from Last 3 Encounters:  10/03/23 98%  07/03/23 97%  03/28/23 97%      Physical Exam Vitals and nursing note reviewed.  Constitutional:      Appearance: Normal  appearance.  Cardiovascular:     Rate and Rhythm: Normal rate and regular rhythm.     Heart sounds: Normal heart sounds.  Pulmonary:     Effort: Pulmonary effort is normal.     Breath sounds: Normal breath sounds.  Abdominal:     General: Bowel sounds are normal. There is no distension.     Palpations: There is no mass.     Tenderness: There is no abdominal tenderness.     Hernia: No hernia is present.  Neurological:     Mental Status: She is alert.      No results found for any visits on 10/03/23.    The ASCVD Risk score (Arnett DK, et al., 2019) failed to calculate for the following reasons:   Cannot find a previous HDL lab   Cannot find a previous total cholesterol lab    Assessment & Plan:   Problem List Items Addressed This Visit       Digestive   Calculus of gallbladder without cholecystitis without obstruction - Primary   Relevant Orders   Ambulatory referral to General Surgery     Other   Generalized anxiety disorder   Patient currently maintained on 0.5 mg of clonazepam nightly.  PDMP reviewed.  Patient will benefit from medication continue medication as prescribed  ADHD (attention deficit hyperactivity disorder)   Patient currently maintained on Adderall 20 mg twice daily.  PDMP reviewed no red flags.  Patient having benefit on medication.  Continue medication as prescribed refills provided today      Relevant Medications   amphetamine-dextroamphetamine (ADDERALL) 20 MG tablet   amphetamine-dextroamphetamine (ADDERALL) 20 MG tablet   amphetamine-dextroamphetamine (ADDERALL) 20 MG tablet   Overweight (BMI 25.0-29.9)   Patient currently on Wegovy 1.7 mg weekly.  Has taken 1 injection so far.  Patient is lost 6 pounds.  Continue working healthy lifestyle modifications continue [employer].  Plan is to titrate up to 2.4 mg after patient is completed 4 doses of 1.7 mg.       Return in about 3 months (around 01/01/2024).    Audria Nine, NP

## 2023-10-03 NOTE — Assessment & Plan Note (Signed)
Patient currently maintained on 0.5 mg of clonazepam nightly.  PDMP reviewed.  Patient will benefit from medication continue medication as prescribed

## 2023-10-03 NOTE — Patient Instructions (Signed)
Nice to see you today We will continue the wegovy. Just let me know when you have taken the third dose Follow up with me in 3 months

## 2023-10-03 NOTE — Assessment & Plan Note (Signed)
Patient currently maintained on Adderall 20 mg twice daily.  PDMP reviewed no red flags.  Patient having benefit on medication.  Continue medication as prescribed refills provided today

## 2023-10-12 ENCOUNTER — Ambulatory Visit: Payer: Medicaid Other

## 2023-10-14 ENCOUNTER — Other Ambulatory Visit: Payer: Self-pay | Admitting: Nurse Practitioner

## 2023-10-17 ENCOUNTER — Encounter: Payer: Self-pay | Admitting: Nurse Practitioner

## 2023-10-19 MED ORDER — WEGOVY 2.4 MG/0.75ML ~~LOC~~ SOAJ: 2.4000 mg | SUBCUTANEOUS | 0 refills | Status: DC

## 2023-10-23 ENCOUNTER — Ambulatory Visit: Payer: Medicaid Other

## 2023-10-24 NOTE — Telephone Encounter (Signed)
 [place]ann [place] to [facility] (supporting Eden Emms, NP)      10/19/23  6:59 AM Yes

## 2023-10-25 ENCOUNTER — Other Ambulatory Visit: Payer: Self-pay | Admitting: Nurse Practitioner

## 2023-10-25 ENCOUNTER — Telehealth: Payer: Medicaid Other

## 2023-10-25 ENCOUNTER — Telehealth: Payer: Medicaid Other | Admitting: Family Medicine

## 2023-10-25 VITALS — HR 80 | Ht 62.0 in | Wt 148.0 lb

## 2023-10-25 DIAGNOSIS — F909 Attention-deficit hyperactivity disorder, unspecified type: Secondary | ICD-10-CM

## 2023-10-25 DIAGNOSIS — F411 Generalized anxiety disorder: Secondary | ICD-10-CM

## 2023-10-25 DIAGNOSIS — J040 Acute laryngitis: Secondary | ICD-10-CM

## 2023-10-25 MED ORDER — CLONAZEPAM 0.5 MG PO TABS: 0.5000 mg | ORAL_TABLET | Freq: Every day | ORAL | 0 refills | Status: DC | PRN

## 2023-10-25 NOTE — Assessment & Plan Note (Addendum)
 Discussed home care for viral illness, including rest, pushing fluids, and OTC medications as needed for symptom relief. Recommend hot tea with honey for sore throat symptoms. Recommend resting the voice. Follow-up if needed for worsening or persistent symptoms.

## 2023-10-25 NOTE — Patient Instructions (Signed)
 Laryngitis  Laryngitis is inflammation of the vocal cords that causes symptoms such as hoarseness or loss of voice. The vocal cords are two bands of muscles in your throat. When you speak, these cords come together and vibrate. The vibrations come out through your mouth as sound. When your vocal cords are inflamed, your voice sounds different. Laryngitis can be temporary (acute) or long-term (chronic). Most cases of acute laryngitis improve with time. Chronic laryngitis is laryngitis that lasts for more than 3 weeks. What are the causes? Acute laryngitis may be caused by: A viral infection. Lots of talking, yelling, or singing. This is also called vocal strain. A bacterial infection (rare). Chronic laryngitis may be caused by: Vocal strain or an injury to the vocal cords. Acid reflux (gastroesophageal reflux disease, or GERD). Allergies, a sinus infection, or postnasal drip. Smoking. Excessive alcohol use. Breathing in chemicals or dust. Growths on the vocal cords. What increases the risk? The following factors may make you more likely to develop this condition: Smoking. Alcohol abuse. Having allergies. Chronic irritants in the workplace, such as toxic fumes. What are the signs or symptoms? Symptoms of this condition may include: Low, hoarse voice. Loss of voice. Dry cough. Sore or dry throat. Stuffy or congested nose. How is this diagnosed? This condition may be diagnosed based on: Your symptoms and a physical exam. Throat culture. Blood test. A procedure in which your health care provider looks at your vocal cords with a mirror or viewing tube (laryngoscopy). How is this treated? Treatment for laryngitis depends on what is causing it. Usually, treatment involves resting your voice and using medicines to soothe your throat. If your laryngitis is caused by a bacterial infection, you may need to take antibiotic medicine. If your laryngitis is caused by a growth, you may need to  have a procedure to remove it. Follow these instructions at home: Medicines Take over-the-counter and prescription medicines only as told by your health care provider. If you were prescribed an antibiotic medicine, take it as told by your health care provider. Do not stop taking the antibiotic even if you start to feel better. Use throat lozenges or sprays to soothe your throat as told by your health care provider. General instructions  Talk as little as possible. To do this: Write instead of talking. Do this until your voice is back to normal. Avoid whispering, which can cause vocal strain. Gargle with a mixture of salt and water 3-4 times a day or as needed. To make salt water, completely dissolve -1 tsp (3-6 g) of salt in 1 cup (237 mL) of warm water. Drink enough fluid to keep your urine pale yellow. Breathe in moist air. Use a humidifier if you live in a dry climate. Do not use any products that contain nicotine or tobacco. These products include cigarettes, chewing tobacco, and vaping devices, such as e-cigarettes. If you need help quitting, ask your health care provider. Contact a health care provider if: You have a fever. You have increasing pain. Your symptoms do not get better in 2 weeks. Get help right away if: You cough up blood. You have difficulty swallowing. You have trouble breathing. Summary Laryngitis is inflammation of the vocal cords that causes symptoms such as hoarseness or loss of voice. Laryngitis can be temporary or long-term. Treatment for laryngitis depends on the cause. It often involves resting your voice and using medicine to soothe your throat. Get help right away if you have difficulty swallowing or breathing or if you  cough up blood. This information is not intended to replace advice given to you by your health care provider. Make sure you discuss any questions you have with your health care provider. Document Revised: 11/09/2020 Document Reviewed:  11/09/2020 Elsevier Patient Education  2024 [employer].
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
## 2023-10-28 ENCOUNTER — Telehealth: Payer: Medicaid Other | Admitting: Family Medicine

## 2023-10-28 DIAGNOSIS — J019 Acute sinusitis, unspecified: Secondary | ICD-10-CM | POA: Diagnosis not present

## 2023-10-28 DIAGNOSIS — B9689 Other specified bacterial agents as the cause of diseases classified elsewhere: Secondary | ICD-10-CM

## 2023-10-28 MED ORDER — AMOXICILLIN-POT CLAVULANATE 875-125 MG PO TABS: 1.0000 | ORAL_TABLET | Freq: Two times a day (BID) | ORAL | 0 refills | Status: DC
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
## 2023-10-28 NOTE — Patient Instructions (Signed)

## 2023-11-06 DIAGNOSIS — K8020 Calculus of gallbladder without cholecystitis without obstruction: Secondary | ICD-10-CM | POA: Diagnosis not present

## 2023-11-10 ENCOUNTER — Ambulatory Visit: Payer: Medicaid Other | Admitting: Nurse Practitioner

## 2023-11-10 ENCOUNTER — Ambulatory Visit
Admission: RE | Admit: 2023-11-10 | Discharge: 2023-11-10 | Disposition: A | Discharge status: Home / Self Care | Payer: Medicaid Other | Source: Ambulatory Visit | Attending: Nurse Practitioner | Admitting: Nurse Practitioner

## 2023-11-10 DIAGNOSIS — Z1231 Encounter for screening mammogram for malignant neoplasm of breast: Secondary | ICD-10-CM

## 2023-11-20 ENCOUNTER — Encounter: Payer: Self-pay | Admitting: Nurse Practitioner

## 2023-11-20 ENCOUNTER — Other Ambulatory Visit: Payer: Self-pay | Admitting: Nurse Practitioner

## 2023-11-20 DIAGNOSIS — F909 Attention-deficit hyperactivity disorder, unspecified type: Secondary | ICD-10-CM

## 2023-11-20 DIAGNOSIS — F411 Generalized anxiety disorder: Secondary | ICD-10-CM

## 2023-11-21 MED ORDER — CLONAZEPAM 0.5 MG PO TABS: 0.5000 mg | ORAL_TABLET | Freq: Every day | ORAL | 0 refills | Status: DC | PRN

## 2023-11-26 ENCOUNTER — Telehealth: Admitting: Family

## 2023-11-26 DIAGNOSIS — J029 Acute pharyngitis, unspecified: Secondary | ICD-10-CM | POA: Diagnosis not present

## 2023-11-26 MED ORDER — ONDANSETRON HCL 4 MG PO TABS: 4.0000 mg | ORAL_TABLET | Freq: Three times a day (TID) | ORAL | 0 refills | Status: DC | PRN

## 2023-11-26 MED ORDER — CLINDAMYCIN HCL 300 MG PO CAPS: 300.0000 mg | ORAL_CAPSULE | Freq: Three times a day (TID) | ORAL | 0 refills | Status: DC
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
## 2023-11-30 DIAGNOSIS — R3915 Urgency of urination: Secondary | ICD-10-CM | POA: Diagnosis not present

## 2023-11-30 DIAGNOSIS — N200 Calculus of kidney: Secondary | ICD-10-CM | POA: Diagnosis not present

## 2023-11-30 DIAGNOSIS — R350 Frequency of micturition: Secondary | ICD-10-CM | POA: Diagnosis not present

## 2023-11-30 DIAGNOSIS — R300 Dysuria: Secondary | ICD-10-CM | POA: Diagnosis not present

## 2023-12-01 ENCOUNTER — Encounter: Payer: Self-pay | Admitting: Nurse Practitioner

## 2023-12-01 NOTE — Telephone Encounter (Signed)
 Can we triage and set her up with a video or in person visit please

## 2023-12-01 NOTE — Telephone Encounter (Signed)
 noted

## 2023-12-01 NOTE — Telephone Encounter (Signed)
 I spoke with pt; pt seen at an UC in [place]  last night but pt does not know name of UC.  Pt said she was out of town and when she came back she had a respiratory problem and was given clindamycin about one wkl ago and then last night was given macrobid; pt is taking both abx. Pt said about 1 yr ago was told had 10 mm kidney stone that did not pass; pt now having rt lower sharp back pain at pain level of 6 - 7.  Pt has not seen blood in urine since 11/30/23.pt does have frequency of urine also. No fever. Pot said she is not going to ED and was just seen at [facility] last night. I explained before ordering CT scan whould need to be eval. Pt said she had to come in between 7:30 - 8:00 due to cannot miss work. Pt scheduled appt with Dr Alphonsus Sias on 12/05/23 at 8 AM with UC & ED precautions and pt voiced understanding.sending note to Dr Alphonsus Sias who is out of office and Audria Nine NP who is in office.

## 2023-12-05 ENCOUNTER — Ambulatory Visit: Admitting: Internal Medicine

## 2023-12-05 ENCOUNTER — Encounter: Payer: Self-pay | Admitting: Internal Medicine

## 2023-12-05 VITALS — BP 102/70 | HR 86 | Temp 98.3°F | Ht 62.0 in | Wt 151.0 lb

## 2023-12-05 DIAGNOSIS — R319 Hematuria, unspecified: Secondary | ICD-10-CM | POA: Insufficient documentation

## 2023-12-05 DIAGNOSIS — R310 Gross hematuria: Secondary | ICD-10-CM

## 2023-12-05 LAB — POC URINALSYSI DIPSTICK (AUTOMATED)
Bilirubin, UA: NEGATIVE
Glucose, UA: NEGATIVE
Ketones, UA: NEGATIVE
Leukocytes, UA: NEGATIVE
Nitrite, UA: NEGATIVE
Protein, UA: POSITIVE — AB
Spec Grav, UA: 1.02 (ref 1.010–1.025)
Urobilinogen, UA: 0.2 U/dL
pH, UA: 6.5 (ref 5.0–8.0)
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
## 2023-12-05 NOTE — Addendum Note (Signed)
 Addended by: Eual Fines on: 12/05/2023 09:16 AM   Modules accepted: Orders

## 2023-12-05 NOTE — Assessment & Plan Note (Signed)
 Had episode of gross hematuria Could have been from infection or the known right side 2-54mm kidney stone moving Pain mostly gone On cefdinir for Staph in urine Urinalysis today--no leuks or nitrite--only trace blood  Discussed that even if this was the stone, no action needed now If pain worsens or gross hematuria recurs, would recheck the CT for stone Can finish out the cefdinir---but can stop the clindamycin

## 2023-12-11 ENCOUNTER — Encounter: Payer: Self-pay | Admitting: Internal Medicine

## 2023-12-11 DIAGNOSIS — R310 Gross hematuria: Secondary | ICD-10-CM

## 2023-12-18 ENCOUNTER — Encounter: Payer: Self-pay | Admitting: Radiology

## 2023-12-18 ENCOUNTER — Encounter: Payer: Self-pay | Admitting: Internal Medicine

## 2023-12-18 ENCOUNTER — Ambulatory Visit
Admission: RE | Admit: 2023-12-18 | Discharge: 2023-12-18 | Disposition: A | Discharge status: Home / Self Care | Source: Ambulatory Visit | Attending: Internal Medicine | Admitting: Internal Medicine

## 2023-12-18 ENCOUNTER — Telehealth: Payer: Self-pay | Admitting: Internal Medicine

## 2023-12-18 DIAGNOSIS — R319 Hematuria, unspecified: Secondary | ICD-10-CM | POA: Diagnosis not present

## 2023-12-18 DIAGNOSIS — R310 Gross hematuria: Secondary | ICD-10-CM

## 2023-12-18 DIAGNOSIS — N134 Hydroureter: Secondary | ICD-10-CM | POA: Diagnosis not present

## 2023-12-18 DIAGNOSIS — N200 Calculus of kidney: Secondary | ICD-10-CM | POA: Diagnosis not present

## 2023-12-18 MED ORDER — HYDROCODONE-ACETAMINOPHEN 5-325 MG PO TABS: 1.0000 | ORAL_TABLET | Freq: Four times a day (QID) | ORAL | 0 refills | Status: DC | PRN

## 2023-12-18 MED ORDER — SODIUM CHLORIDE 0.9 % IV SOLN: INTRAVENOUS | Status: DC

## 2023-12-18 MED ORDER — TAMSULOSIN HCL 0.4 MG PO CAPS: 0.4000 mg | ORAL_CAPSULE | Freq: Every day | ORAL | 3 refills | Status: DC

## 2023-12-18 MED ORDER — IOHEXOL 300 MG/ML  SOLN
100.0000 mL | Freq: Once | INTRAMUSCULAR | Status: AC | PRN
  Administered 2023-12-18: 100 mL via INTRAVENOUS

## 2023-12-18 NOTE — Telephone Encounter (Signed)
 Phone call to patient about the results of CT scan Just the stone--nothing more worrisome--hopefully can pass Rx--vicodion and tamsulosin Has appt next month with [facility]--can let them know about the CT scan

## 2023-12-21 NOTE — Telephone Encounter (Signed)
 CT Scan done 12/18/2023  Nothing further needed.

## 2023-12-24 ENCOUNTER — Other Ambulatory Visit: Payer: Self-pay | Admitting: Nurse Practitioner

## 2023-12-24 DIAGNOSIS — K219 Gastro-esophageal reflux disease without esophagitis: Secondary | ICD-10-CM

## 2023-12-24 DIAGNOSIS — F411 Generalized anxiety disorder: Secondary | ICD-10-CM

## 2023-12-25 ENCOUNTER — Encounter: Payer: Self-pay | Admitting: Nurse Practitioner

## 2023-12-25 DIAGNOSIS — F909 Attention-deficit hyperactivity disorder, unspecified type: Secondary | ICD-10-CM

## 2023-12-25 DIAGNOSIS — F411 Generalized anxiety disorder: Secondary | ICD-10-CM

## 2023-12-25 MED ORDER — CEPHALEXIN 500 MG PO CAPS: 500.0000 mg | ORAL_CAPSULE | Freq: Three times a day (TID) | ORAL | 0 refills | Status: DC

## 2023-12-25 MED ORDER — CLONAZEPAM 0.5 MG PO TABS: 0.5000 mg | ORAL_TABLET | Freq: Every day | ORAL | 0 refills | Status: DC | PRN

## 2023-12-25 MED ORDER — PANTOPRAZOLE SODIUM 20 MG PO TBEC: 20.0000 mg | DELAYED_RELEASE_TABLET | Freq: Every day | ORAL | 0 refills | Status: DC

## 2023-12-26 ENCOUNTER — Other Ambulatory Visit: Payer: Self-pay | Admitting: Nurse Practitioner

## 2023-12-27 MED ORDER — WEGOVY 2.4 MG/0.75ML ~~LOC~~ SOAJ: 2.4000 mg | SUBCUTANEOUS | 0 refills | Status: DC

## 2024-01-05 MED ORDER — SULFAMETHOXAZOLE-TRIMETHOPRIM 800-160 MG PO TABS: 1.0000 | ORAL_TABLET | Freq: Two times a day (BID) | ORAL | 0 refills | Status: DC

## 2024-01-10 ENCOUNTER — Encounter (HOSPITAL_COMMUNITY): Payer: Self-pay

## 2024-01-17 ENCOUNTER — Ambulatory Visit: Payer: Medicaid Other | Admitting: Nurse Practitioner

## 2024-01-22 MED ORDER — AMPHETAMINE-DEXTROAMPHETAMINE 20 MG PO TABS: 20.0000 mg | ORAL_TABLET | Freq: Two times a day (BID) | ORAL | 0 refills | Status: DC

## 2024-01-22 MED ORDER — CLONAZEPAM 0.5 MG PO TABS: 0.5000 mg | ORAL_TABLET | Freq: Every day | ORAL | 0 refills | Status: DC | PRN

## 2024-01-22 NOTE — Addendum Note (Signed)
 Addended by: Dorothe Gaster on: 01/22/2024 04:12 PM   Modules accepted: Orders

## 2024-01-30 ENCOUNTER — Ambulatory Visit: Admitting: Nurse Practitioner

## 2024-01-30 VITALS — BP 102/74 | HR 78 | Temp 98.4°F | Ht 62.0 in | Wt 144.6 lb

## 2024-01-30 DIAGNOSIS — H6992 Unspecified Eustachian tube disorder, left ear: Secondary | ICD-10-CM | POA: Diagnosis not present

## 2024-01-30 DIAGNOSIS — E663 Overweight: Secondary | ICD-10-CM | POA: Diagnosis not present

## 2024-01-30 DIAGNOSIS — R591 Generalized enlarged lymph nodes: Secondary | ICD-10-CM | POA: Insufficient documentation

## 2024-01-30 NOTE — Patient Instructions (Signed)
 Nice to see you today. You can get over-the-counter antihistamine such as Claritin, Zyrtec, Xyzal, Allegra. Using Flonase over-the-counter will help with the fluid behind your left ear. Make an appointment with Dr. Geralyn Knee at your convenience Follow-up with me in 3 months

## 2024-01-30 NOTE — Assessment & Plan Note (Signed)
 Currently on Wegovy  2.4 mg weekly.  Patient is continue to lose weight.  Continue working hydroxyl modifications.  Patient to work on cutting down on soda intake.  Continue Wegovy  as prescribed

## 2024-01-30 NOTE — Progress Notes (Signed)
 Acute Office Visit  Subjective:     Patient ID: Heather [name], female    DOB: 07/20/1981, 43 y.o.   MRN: [id number]  Chief Complaint  Patient presents with   Cyst    Pt complains of knot on the back of left ear. Pt states of pain that started last weekend. Pt complains that pain is starting to make the side of ear hurt. Knot was size of two skittles but pt thinks its gotten a little smaller.     HPI Patient is in today for  skin mass with a history of ADHD, GAD, MDD, vitamin deficiencies, GERD.  Behind the left ear and it was on Saturday that it started hurting No sick contacts.  States that she noticed a know that hurts with and without touching. State that it is going up into her ear. States that she has tried nothing over the counter. The pain is described as an achying pain. The pain in the year is sharp and has been going on for weeks.   Wegovy : on 2.4 weekly. She feels like she is doing well. States that she is eating 1-2 meals a day. She is snacking "too much". States that if she has snacks she will have it. States that she feels like that has been since smoking.  States that the portion sizes have decreased  She is walking at work but no formal exercise  She is drinking soda several times a day    Review of Systems  Constitutional:  Negative for chills and fever.  HENT:  Positive for sore throat. Negative for ear discharge and ear pain.   Respiratory:  Negative for cough and shortness of breath.   Cardiovascular:  Negative for chest pain.  Gastrointestinal:  Negative for abdominal pain, constipation, diarrhea, nausea and vomiting.       BM daily         Objective:    BP 102/74   Pulse 78   Temp 98.4 F (36.9 C) (Oral)   Ht 5\' 2"  (1.575 m)   Wt 144 lb 9.6 oz (65.6 kg)   SpO2 99%   BMI 26.45 kg/m  BP Readings from Last 3 Encounters:  01/30/24 102/74  12/05/23 102/70  10/03/23 102/74   Wt Readings from Last 3 Encounters:  01/30/24 144 lb 9.6 oz (65.6  kg)  12/05/23 151 lb (68.5 kg)  10/25/23 148 lb (67.1 kg)   SpO2 Readings from Last 3 Encounters:  01/30/24 99%  12/05/23 99%  10/25/23 97%      Physical Exam Vitals and nursing note reviewed.  Constitutional:      Appearance: Normal appearance.  HENT:     Right Ear: Tympanic membrane, ear canal and external ear normal.     Left Ear: Tympanic membrane, ear canal and external ear normal.  Neck:   Cardiovascular:     Rate and Rhythm: Normal rate and regular rhythm.     Heart sounds: Normal heart sounds.  Pulmonary:     Effort: Pulmonary effort is normal.     Breath sounds: Normal breath sounds.  Neurological:     Mental Status: She is alert.     No results found for any visits on 01/30/24.      Assessment & Plan:   Problem List Items Addressed This Visit       Nervous and Auditory   Dysfunction of left eustachian tube   Recommend over-the-counter second-generation antihistamine use along with fluticasone nasal spray.  Immune and Lymphatic   Lymphadenopathy - Primary   Likely reactive we will give it up to 2 weeks to resolve-use Tylenol /ibuprofen  as needed over-the-counter and warm compresses.        Other   Overweight   Currently on Wegovy  2.4 mg weekly.  Patient is continue to lose weight.  Continue working hydroxyl modifications.  Patient to work on cutting down on soda intake.  Continue Wegovy  as prescribed       No orders of the defined types were placed in this encounter.   Return if symptoms worsen or fail to improve, for As scheduled.  Margarie Shay, NP

## 2024-01-30 NOTE — Assessment & Plan Note (Signed)
 Recommend over-the-counter second-generation antihistamine use along with fluticasone nasal spray.

## 2024-01-30 NOTE — Assessment & Plan Note (Signed)
 Likely reactive we will give it up to 2 weeks to resolve-use Tylenol /ibuprofen  as needed over-the-counter and warm compresses.

## 2024-02-02 NOTE — Telephone Encounter (Signed)
 Pt notified as instructed and she is working and last day of month and pt cannot leave work; she may go to UC to be seen and tested. She will let Matt know when she goes and what the results of the strep test are. Sending note to M. Cable NP.

## 2024-02-02 NOTE — Telephone Encounter (Signed)
 I spoke with pt; pt said starting this morning pt has very sore, red swollen throat with white spots. Pt is not drinking a lot due to throat being so sore. Pt said throat is not  swollen shut. Pt said she had to go to work but she feels really bad. Pt is taking OTC antihistamine but does not know the name of it. Pt has not gotten flonase yet but ear is no worse than when seen on 01/30/24. No available appts at [facility] today.UC & ED precautions given.Pt wants to know if Jolan Natal will send in rx to [facility] [place] since pt was just seen on 01/30/24.pt said she will not go to UC until Sunday because any testing done today would be negative since symptoms started today. Sending note to Rosita Cools NP [employer] and will teams [place] CMA.

## 2024-02-02 NOTE — Telephone Encounter (Signed)
 I am willing to do a POC strep test with out a visit since I just saw her. If she is willing to come have it done

## 2024-02-07 ENCOUNTER — Ambulatory Visit: Admitting: Family Medicine

## 2024-02-13 ENCOUNTER — Encounter: Payer: Self-pay | Admitting: Family Medicine

## 2024-02-13 NOTE — Progress Notes (Unsigned)
     Heather [name] T. Heather Genova, MD, CAQ Sports Medicine [facility] at [facility] [address] [place], [address]  Phone: [phone]  FAX: [phone]  Heather [name] - 43 y.o. [name]  MRN [id number]  Date of Birth: August 12, 1981  Date: 02/14/2024  PCP: Dorothe Gaster, NP  Referral: Dorothe Gaster, NP  No chief complaint on file.  Subjective:   Heather [name] is a 43 y.o. very pleasant [name] patient with There is no height or weight on file to calculate BMI. who presents with the following:  She is a pleasant 43 year old patient who presents with some ongoing shoulder pain. No history of prior shoulder operation.    Review of Systems is noted in the HPI, as appropriate  Objective:   There were no vitals taken for this visit.  GEN: No acute distress; alert,appropriate. PULM: Breathing comfortably in no respiratory distress PSYCH: Normally interactive.   Laboratory and Imaging Data:  Assessment and Plan:   ***

## 2024-02-14 ENCOUNTER — Ambulatory Visit (INDEPENDENT_AMBULATORY_CARE_PROVIDER_SITE_OTHER)
Admission: RE | Admit: 2024-02-14 | Discharge: 2024-02-14 | Disposition: A | Discharge status: Home / Self Care | Source: Ambulatory Visit | Attending: Family Medicine | Admitting: Family Medicine

## 2024-02-14 ENCOUNTER — Encounter: Payer: Self-pay | Admitting: Family Medicine

## 2024-02-14 ENCOUNTER — Ambulatory Visit: Payer: Self-pay | Admitting: Family Medicine

## 2024-02-14 ENCOUNTER — Ambulatory Visit: Admitting: Family Medicine

## 2024-02-14 VITALS — BP 90/60 | HR 83 | Temp 98.4°F | Ht 62.0 in | Wt 142.5 lb

## 2024-02-14 DIAGNOSIS — M7541 Impingement syndrome of right shoulder: Secondary | ICD-10-CM | POA: Diagnosis not present

## 2024-02-14 DIAGNOSIS — M19011 Primary osteoarthritis, right shoulder: Secondary | ICD-10-CM | POA: Diagnosis not present

## 2024-02-14 DIAGNOSIS — M7542 Impingement syndrome of left shoulder: Secondary | ICD-10-CM | POA: Diagnosis not present

## 2024-02-14 DIAGNOSIS — M25512 Pain in left shoulder: Secondary | ICD-10-CM

## 2024-02-14 DIAGNOSIS — M25511 Pain in right shoulder: Secondary | ICD-10-CM

## 2024-02-14 MED ORDER — PREDNISONE 20 MG PO TABS: ORAL_TABLET | ORAL | 0 refills | Status: DC

## 2024-02-14 NOTE — Patient Instructions (Signed)
 Voltaren 1% gel, over the counter ?You can apply up to 4 times a day ? ?This can be applied to any joint: knee, wrist, fingers, elbows, shoulders, feet and ankles. ?Can apply to any tendon: tennis elbow, achilles, tendon, rotator cuff or any other tendon. ? ?Minimal is absorbed in the bloodstream: ok with oral anti-inflammatory or a blood thinner. ? ?Cost is about 9 dollars  ?

## 2024-02-21 ENCOUNTER — Encounter: Payer: Self-pay | Admitting: Nurse Practitioner

## 2024-02-21 DIAGNOSIS — F909 Attention-deficit hyperactivity disorder, unspecified type: Secondary | ICD-10-CM

## 2024-02-21 DIAGNOSIS — F411 Generalized anxiety disorder: Secondary | ICD-10-CM

## 2024-02-22 NOTE — Telephone Encounter (Signed)
 Both filled on 01/22/24 for 30 day.  Last visit was acute visit 01/30/24 with follow up on 04/18/24.  No UDS or up to date contract in chart.

## 2024-02-23 ENCOUNTER — Other Ambulatory Visit: Payer: Self-pay | Admitting: Nurse Practitioner

## 2024-02-23 DIAGNOSIS — K219 Gastro-esophageal reflux disease without esophagitis: Secondary | ICD-10-CM

## 2024-02-23 MED ORDER — CLONAZEPAM 0.5 MG PO TABS: 0.5000 mg | ORAL_TABLET | Freq: Every day | ORAL | 0 refills | Status: DC | PRN

## 2024-02-23 MED ORDER — AMPHETAMINE-DEXTROAMPHETAMINE 20 MG PO TABS: 20.0000 mg | ORAL_TABLET | Freq: Two times a day (BID) | ORAL | 0 refills | Status: DC

## 2024-02-26 ENCOUNTER — Telehealth: Payer: Self-pay

## 2024-02-26 DIAGNOSIS — F909 Attention-deficit hyperactivity disorder, unspecified type: Secondary | ICD-10-CM

## 2024-02-26 NOTE — Telephone Encounter (Signed)
 Copied from CRM [id number]. Topic: Clinical - Prescription Issue >> Feb 26, 2024  8:41 AM Ernestene P wrote: Reason for CRM: [facility] pharmacy advise amphetamine -dextroamphetamine  (ADDERALL) 20 MG tablet is on back order- they dispense 35 tablets of the 20 they had, rest of prescription will be voided.

## 2024-02-26 NOTE — Telephone Encounter (Signed)
 Called pt.   Pt states that her pharmacy would like the provider to call in the remainder tablets but place them on hold.  Pt states that her pharmacy will prioritize her refills for Adderall once the shipment is received as it is hard for them to keep this medication in stock.

## 2024-02-26 NOTE — Telephone Encounter (Signed)
 Noted. She can call the office or send a note when she get close to the end of what they had. She can call the pharmacy prior and make sure they have the medication and then I will send in a script for the remainder

## 2024-02-27 MED ORDER — AMPHETAMINE-DEXTROAMPHETAMINE 20 MG PO TABS: 20.0000 mg | ORAL_TABLET | Freq: Two times a day (BID) | ORAL | 0 refills | Status: DC

## 2024-02-27 NOTE — Addendum Note (Signed)
 Addended by: WENDEE LYNWOOD HERO on: 02/27/2024 01:03 PM   Modules accepted: Orders

## 2024-03-19 ENCOUNTER — Other Ambulatory Visit: Payer: Self-pay | Admitting: Nurse Practitioner

## 2024-03-20 ENCOUNTER — Other Ambulatory Visit (HOSPITAL_COMMUNITY): Payer: Self-pay

## 2024-03-20 ENCOUNTER — Telehealth: Payer: Self-pay

## 2024-03-20 NOTE — Telephone Encounter (Signed)
 Pharmacy Patient Advocate Encounter  Received notification from [facility] that Prior Authorization for Wegovy  2.4 has been APPROVED from 03/20/24 to 03/20/25. Ran test claim, Copay is $4.00 for 84 day supply. This test claim was processed through [facility]- copay amounts may vary at other pharmacies due to pharmacy/plan contracts, or as the patient moves through the different stages of their insurance plan.   PA #/Case ID/Reference #: [id number]

## 2024-03-20 NOTE — Telephone Encounter (Signed)
 Pharmacy Patient Advocate Encounter   Received notification from CoverMyMeds that prior authorization for Wegovy  2.4 is required/requested.   Insurance verification completed.   The patient is insured through [employer] .   Per test claim: PA required; PA submitted to above mentioned insurance via CoverMyMeds Key/confirmation #/EOC [id number] Status is pending

## 2024-03-22 ENCOUNTER — Other Ambulatory Visit: Payer: Self-pay

## 2024-03-22 ENCOUNTER — Observation Stay (HOSPITAL_COMMUNITY)
Admission: EM | Admit: 2024-03-22 | Discharge: 2024-03-23 | Disposition: A | Discharge status: Home / Self Care | Attending: Surgery | Admitting: Surgery

## 2024-03-22 ENCOUNTER — Encounter (HOSPITAL_COMMUNITY)
Admission: EM | Disposition: A | Discharge status: Home / Self Care | Payer: Self-pay | Source: Home / Self Care | Attending: Emergency Medicine

## 2024-03-22 ENCOUNTER — Emergency Department (HOSPITAL_COMMUNITY): Admitting: Certified Registered Nurse Anesthetist

## 2024-03-22 ENCOUNTER — Encounter (HOSPITAL_COMMUNITY): Payer: Self-pay | Admitting: Registered Nurse

## 2024-03-22 ENCOUNTER — Emergency Department (HOSPITAL_COMMUNITY)

## 2024-03-22 DIAGNOSIS — K8020 Calculus of gallbladder without cholecystitis without obstruction: Secondary | ICD-10-CM | POA: Diagnosis not present

## 2024-03-22 DIAGNOSIS — K810 Acute cholecystitis: Secondary | ICD-10-CM | POA: Diagnosis present

## 2024-03-22 DIAGNOSIS — K8010 Calculus of gallbladder with chronic cholecystitis without obstruction: Secondary | ICD-10-CM | POA: Diagnosis not present

## 2024-03-22 DIAGNOSIS — K819 Cholecystitis, unspecified: Secondary | ICD-10-CM | POA: Diagnosis not present

## 2024-03-22 DIAGNOSIS — K8012 Calculus of gallbladder with acute and chronic cholecystitis without obstruction: Secondary | ICD-10-CM | POA: Diagnosis not present

## 2024-03-22 DIAGNOSIS — R109 Unspecified abdominal pain: Secondary | ICD-10-CM | POA: Diagnosis not present

## 2024-03-22 DIAGNOSIS — R1011 Right upper quadrant pain: Secondary | ICD-10-CM | POA: Diagnosis present

## 2024-03-22 HISTORY — PX: GALLBLADDER SURGERY: SHX652

## 2024-03-22 HISTORY — PX: CHOLECYSTECTOMY: SHX55

## 2024-03-22 LAB — COMPREHENSIVE METABOLIC PANEL WITH GFR
ALT: 21 U/L (ref 0–44)
AST: 27 U/L (ref 15–41)
Albumin: 4.5 g/dL (ref 3.5–5.0)
Alkaline Phosphatase: 62 U/L (ref 38–126)
Anion gap: 8 (ref 5–15)
BUN: 9 mg/dL (ref 6–20)
CO2: 23 mmol/L (ref 22–32)
Calcium: 8.9 mg/dL (ref 8.9–10.3)
Chloride: 104 mmol/L (ref 98–111)
Creatinine, Ser: 0.62 mg/dL (ref 0.44–1.00)
GFR, Estimated: >60 mL/min (ref >60)
Glucose, Bld: 95 mg/dL (ref 70–99)
Potassium: 3.7 mmol/L (ref 3.5–5.1)
Sodium: 135 mmol/L (ref 135–145)
Total Bilirubin: 0.9 mg/dL (ref 0.0–1.2)
Total Protein: 7.9 g/dL (ref 6.5–8.1)

## 2024-03-22 LAB — CBC
HCT: 43.9 % (ref 36.0–46.0)
Hemoglobin: 14.4 g/dL (ref 12.0–15.0)
MCH: 28.9 pg (ref 26.0–34.0)
MCHC: 32.8 g/dL (ref 30.0–36.0)
MCV: 88 fL (ref 80.0–100.0)
Platelets: 418 K/uL — ABNORMAL HIGH (ref 150–400)
RBC: 4.99 MIL/uL (ref 3.87–5.11)
RDW: 13.3 % (ref 11.5–15.5)
WBC: 10.8 K/uL — ABNORMAL HIGH (ref 4.0–10.5)
nRBC: 0 % (ref 0.0–0.2)

## 2024-03-22 LAB — URINALYSIS, ROUTINE W REFLEX MICROSCOPIC
Bacteria, UA: NONE SEEN
Bilirubin Urine: NEGATIVE
Glucose, UA: NEGATIVE mg/dL
Ketones, ur: NEGATIVE mg/dL
Nitrite: NEGATIVE
Protein, ur: NEGATIVE mg/dL
Specific Gravity, Urine: 1.015 (ref 1.005–1.030)
pH: 5 (ref 5.0–8.0)

## 2024-03-22 LAB — LIPASE, BLOOD: Lipase: 59 U/L — ABNORMAL HIGH (ref 11–51)

## 2024-03-22 LAB — HCG, SERUM, QUALITATIVE: Preg, Serum: NEGATIVE

## 2024-03-22 LAB — PREGNANCY, URINE: Preg Test, Ur: NEGATIVE

## 2024-03-22 SURGERY — LAPAROSCOPIC CHOLECYSTECTOMY
Anesthesia: General | Site: Abdomen

## 2024-03-22 MED ORDER — DEXAMETHASONE SODIUM PHOSPHATE 4 MG/ML IJ SOLN
INTRAMUSCULAR | Status: DC | PRN
  Administered 2024-03-22: 4 mg via INTRAVENOUS

## 2024-03-22 MED ORDER — SODIUM CHLORIDE 0.9 % IV BOLUS
1000.0000 mL | Freq: Once | INTRAVENOUS | Status: AC
  Administered 2024-03-22: 1000 mL via INTRAVENOUS

## 2024-03-22 MED ORDER — GABAPENTIN 300 MG PO CAPS
300.0000 mg | ORAL_CAPSULE | Freq: Three times a day (TID) | ORAL | Status: DC
  Administered 2024-03-22 – 2024-03-23 (×2): 300 mg via ORAL
  Filled 2024-03-22: qty 1
  Filled 2024-03-22: qty 3

## 2024-03-22 MED ORDER — ROCURONIUM BROMIDE 10 MG/ML (PF) SYRINGE
PREFILLED_SYRINGE | INTRAVENOUS | Status: DC | PRN
  Administered 2024-03-22: 30 mg via INTRAVENOUS
  Administered 2024-03-22: 10 mg via INTRAVENOUS

## 2024-03-22 MED ORDER — CLONAZEPAM 0.5 MG PO TABS: 0.5000 mg | ORAL_TABLET | Freq: Two times a day (BID) | ORAL | Status: DC | PRN

## 2024-03-22 MED ORDER — SUGAMMADEX SODIUM 200 MG/2ML IV SOLN
INTRAVENOUS | Status: DC | PRN
Start: 2024-03-22 — End: 2024-03-22
  Administered 2024-03-22: 200 mg via INTRAVENOUS

## 2024-03-22 MED ORDER — FENTANYL CITRATE (PF) 100 MCG/2ML IJ SOLN
INTRAMUSCULAR | Status: DC | PRN
  Administered 2024-03-22 (×2): 50 ug via INTRAVENOUS

## 2024-03-22 MED ORDER — BUPIVACAINE-EPINEPHRINE (PF) 0.25% -1:200000 IJ SOLN
INTRAMUSCULAR | Status: AC
  Filled 2024-03-22: qty 30

## 2024-03-22 MED ORDER — MIDAZOLAM HCL 2 MG/2ML IJ SOLN
INTRAMUSCULAR | Status: AC
  Filled 2024-03-22: qty 2

## 2024-03-22 MED ORDER — DEXMEDETOMIDINE HCL IN NACL 80 MCG/20ML IV SOLN
INTRAVENOUS | Status: DC | PRN
  Administered 2024-03-22: 8 ug via INTRAVENOUS

## 2024-03-22 MED ORDER — 0.9 % SODIUM CHLORIDE (POUR BTL) OPTIME
TOPICAL | Status: DC | PRN
Start: 2024-03-22 — End: 2024-03-22
  Administered 2024-03-22: 1000 mL

## 2024-03-22 MED ORDER — OXYCODONE HCL 5 MG PO TABS: 5.0000 mg | ORAL_TABLET | ORAL | Status: DC | PRN

## 2024-03-22 MED ORDER — ONDANSETRON HCL 4 MG/2ML IJ SOLN
4.0000 mg | Freq: Once | INTRAMUSCULAR | Status: AC
  Administered 2024-03-22: 4 mg via INTRAVENOUS
  Filled 2024-03-22: qty 2

## 2024-03-22 MED ORDER — KETOROLAC TROMETHAMINE 15 MG/ML IJ SOLN
15.0000 mg | Freq: Three times a day (TID) | INTRAMUSCULAR | Status: DC
  Administered 2024-03-22 – 2024-03-23 (×2): 15 mg via INTRAVENOUS
  Filled 2024-03-22 (×3): qty 1

## 2024-03-22 MED ORDER — AMPHETAMINE-DEXTROAMPHETAMINE 20 MG PO TABS: 20.0000 mg | ORAL_TABLET | Freq: Two times a day (BID) | ORAL | Status: DC

## 2024-03-22 MED ORDER — LACTATED RINGERS IV SOLN: INTRAVENOUS | Status: DC | PRN

## 2024-03-22 MED ORDER — BUPIVACAINE-EPINEPHRINE 0.25% -1:200000 IJ SOLN
INTRAMUSCULAR | Status: DC | PRN
Start: 2024-03-22 — End: 2024-03-22
  Administered 2024-03-22: 30 mL

## 2024-03-22 MED ORDER — ONDANSETRON HCL 4 MG/2ML IJ SOLN: 4.0000 mg | Freq: Once | INTRAMUSCULAR | Status: DC | PRN

## 2024-03-22 MED ORDER — LIDOCAINE HCL 2 % IJ SOLN
INTRAMUSCULAR | Status: AC
  Filled 2024-03-22: qty 20

## 2024-03-22 MED ORDER — ENOXAPARIN SODIUM 40 MG/0.4ML IJ SOSY
40.0000 mg | PREFILLED_SYRINGE | INTRAMUSCULAR | Status: DC
  Administered 2024-03-23: 40 mg via SUBCUTANEOUS
  Filled 2024-03-22: qty 0.4

## 2024-03-22 MED ORDER — METHOCARBAMOL 1000 MG/10ML IJ SOLN: 500.0000 mg | Freq: Four times a day (QID) | INTRAMUSCULAR | Status: DC | PRN

## 2024-03-22 MED ORDER — KETOROLAC TROMETHAMINE 30 MG/ML IJ SOLN
INTRAMUSCULAR | Status: DC | PRN
Start: 2024-03-22 — End: 2024-03-22
  Administered 2024-03-22: 30 mg via INTRAVENOUS

## 2024-03-22 MED ORDER — HYDROMORPHONE HCL 1 MG/ML IJ SOLN
0.5000 mg | INTRAMUSCULAR | Status: DC | PRN
  Administered 2024-03-22 – 2024-03-23 (×2): 0.5 mg via INTRAVENOUS
  Filled 2024-03-22 (×2): qty 0.5

## 2024-03-22 MED ORDER — PROPOFOL 500 MG/50ML IV EMUL
INTRAVENOUS | Status: DC | PRN
  Administered 2024-03-22: 50 ug/kg/min via INTRAVENOUS

## 2024-03-22 MED ORDER — LIDOCAINE HCL (PF) 2 % IJ SOLN
INTRAMUSCULAR | Status: DC | PRN
  Administered 2024-03-22: 60 mg via INTRADERMAL

## 2024-03-22 MED ORDER — PANTOPRAZOLE SODIUM 20 MG PO TBEC
20.0000 mg | DELAYED_RELEASE_TABLET | Freq: Every day | ORAL | Status: DC
  Administered 2024-03-22 – 2024-03-23 (×2): 20 mg via ORAL
  Filled 2024-03-22 (×2): qty 1

## 2024-03-22 MED ORDER — ROCURONIUM BROMIDE 10 MG/ML (PF) SYRINGE
PREFILLED_SYRINGE | INTRAVENOUS | Status: AC
  Filled 2024-03-22: qty 10

## 2024-03-22 MED ORDER — CEFAZOLIN SODIUM-DEXTROSE 2-4 GM/100ML-% IV SOLN
2.0000 g | INTRAVENOUS | Status: AC
  Administered 2024-03-22: 2 g via INTRAVENOUS
  Filled 2024-03-22: qty 100

## 2024-03-22 MED ORDER — DOCUSATE SODIUM 100 MG PO CAPS
100.0000 mg | ORAL_CAPSULE | Freq: Two times a day (BID) | ORAL | Status: DC
  Administered 2024-03-22 – 2024-03-23 (×2): 100 mg via ORAL
  Filled 2024-03-22 (×2): qty 1

## 2024-03-22 MED ORDER — ONDANSETRON HCL 4 MG/2ML IJ SOLN
4.0000 mg | Freq: Four times a day (QID) | INTRAMUSCULAR | Status: DC | PRN
  Administered 2024-03-22: 4 mg via INTRAVENOUS
  Filled 2024-03-22: qty 2

## 2024-03-22 MED ORDER — OXYCODONE HCL 5 MG PO TABS: 10.0000 mg | ORAL_TABLET | ORAL | Status: DC | PRN

## 2024-03-22 MED ORDER — CHLORHEXIDINE GLUCONATE 0.12 % MT SOLN
15.0000 mL | Freq: Once | OROMUCOSAL | Status: AC
  Administered 2024-03-22: 15 mL via OROMUCOSAL

## 2024-03-22 MED ORDER — SUCCINYLCHOLINE CHLORIDE 200 MG/10ML IV SOSY
PREFILLED_SYRINGE | INTRAVENOUS | Status: DC | PRN
  Administered 2024-03-22: 100 mg via INTRAVENOUS

## 2024-03-22 MED ORDER — OXYCODONE HCL 5 MG PO TABS: 5.0000 mg | ORAL_TABLET | Freq: Once | ORAL | Status: DC | PRN

## 2024-03-22 MED ORDER — PROPOFOL 10 MG/ML IV BOLUS
INTRAVENOUS | Status: DC | PRN
  Administered 2024-03-22: 150 mg via INTRAVENOUS

## 2024-03-22 MED ORDER — SCOPOLAMINE 1 MG/3DAYS TD PT72
1.0000 | MEDICATED_PATCH | TRANSDERMAL | Status: DC
  Administered 2024-03-22: 1.5 mg via TRANSDERMAL
  Filled 2024-03-22: qty 1

## 2024-03-22 MED ORDER — KETOROLAC TROMETHAMINE 30 MG/ML IJ SOLN: 30.0000 mg | Freq: Once | INTRAMUSCULAR | Status: DC | PRN

## 2024-03-22 MED ORDER — MIDAZOLAM HCL 5 MG/5ML IJ SOLN
INTRAMUSCULAR | Status: DC | PRN
  Administered 2024-03-22 (×2): 2 mg via INTRAVENOUS

## 2024-03-22 MED ORDER — OXYCODONE HCL 5 MG/5ML PO SOLN: 5.0000 mg | Freq: Once | ORAL | Status: DC | PRN

## 2024-03-22 MED ORDER — KETOROLAC TROMETHAMINE 30 MG/ML IJ SOLN
INTRAMUSCULAR | Status: AC
Start: 2024-03-22 — End: 2024-03-22
  Filled 2024-03-22: qty 1

## 2024-03-22 MED ORDER — FENTANYL CITRATE (PF) 100 MCG/2ML IJ SOLN
INTRAMUSCULAR | Status: AC
  Filled 2024-03-22: qty 2

## 2024-03-22 MED ORDER — ACETAMINOPHEN 500 MG PO TABS
1000.0000 mg | ORAL_TABLET | Freq: Once | ORAL | Status: AC
  Administered 2024-03-22: 1000 mg via ORAL
  Filled 2024-03-22: qty 2

## 2024-03-22 MED ORDER — PROCHLORPERAZINE EDISYLATE 10 MG/2ML IJ SOLN: 10.0000 mg | INTRAMUSCULAR | Status: DC | PRN

## 2024-03-22 MED ORDER — PROPOFOL 1000 MG/100ML IV EMUL
INTRAVENOUS | Status: AC
  Filled 2024-03-22: qty 100

## 2024-03-22 MED ORDER — KETAMINE HCL 50 MG/5ML IJ SOSY
PREFILLED_SYRINGE | INTRAMUSCULAR | Status: DC | PRN
Start: 2024-03-22 — End: 2024-03-22
  Administered 2024-03-22: 30 mg via INTRAVENOUS

## 2024-03-22 MED ORDER — AMISULPRIDE (ANTIEMETIC) 5 MG/2ML IV SOLN: 10.0000 mg | Freq: Once | INTRAVENOUS | Status: DC | PRN

## 2024-03-22 MED ORDER — SIMETHICONE 80 MG PO CHEW: 80.0000 mg | CHEWABLE_TABLET | Freq: Four times a day (QID) | ORAL | Status: DC | PRN

## 2024-03-22 MED ORDER — KETAMINE HCL 50 MG/5ML IJ SOSY
PREFILLED_SYRINGE | INTRAMUSCULAR | Status: AC
  Filled 2024-03-22: qty 5

## 2024-03-22 MED ORDER — LACTATED RINGERS IV SOLN: INTRAVENOUS | Status: DC

## 2024-03-22 MED ORDER — MEPERIDINE HCL 25 MG/ML IJ SOLN: 6.2500 mg | INTRAMUSCULAR | Status: DC | PRN

## 2024-03-22 MED ORDER — ACETAMINOPHEN 325 MG PO TABS
650.0000 mg | ORAL_TABLET | Freq: Four times a day (QID) | ORAL | Status: DC
  Administered 2024-03-23 (×2): 650 mg via ORAL
  Filled 2024-03-22 (×2): qty 2

## 2024-03-22 MED ORDER — CEFAZOLIN SODIUM-DEXTROSE 2-4 GM/100ML-% IV SOLN: 2.0000 g | Freq: Three times a day (TID) | INTRAVENOUS | Status: DC

## 2024-03-22 MED ORDER — PHENYLEPHRINE 80 MCG/ML (10ML) SYRINGE FOR IV PUSH (FOR BLOOD PRESSURE SUPPORT)
PREFILLED_SYRINGE | INTRAVENOUS | Status: DC | PRN
  Administered 2024-03-22 (×3): 80 ug via INTRAVENOUS
  Administered 2024-03-22: 160 ug via INTRAVENOUS

## 2024-03-22 MED ORDER — STERILE WATER FOR IRRIGATION IR SOLN
Status: DC | PRN
  Administered 2024-03-22: 1000 mL

## 2024-03-22 MED ORDER — CEFAZOLIN SODIUM-DEXTROSE 2-4 GM/100ML-% IV SOLN
INTRAVENOUS | Status: AC
  Filled 2024-03-22: qty 100

## 2024-03-22 MED ORDER — HYDROMORPHONE HCL 1 MG/ML IJ SOLN: 0.2500 mg | INTRAMUSCULAR | Status: DC | PRN

## 2024-03-22 MED ORDER — HYDROMORPHONE HCL 1 MG/ML IJ SOLN
1.0000 mg | Freq: Once | INTRAMUSCULAR | Status: AC
  Administered 2024-03-22: 1 mg via INTRAVENOUS
  Filled 2024-03-22: qty 1

## 2024-03-22 SURGICAL SUPPLY — 33 items
BAG COUNTER SPONGE SURGICOUNT (BAG) IMPLANT
CABLE HIGH FREQUENCY MONO STRZ (ELECTRODE) ×1 IMPLANT
CATH URETL OPEN 5X70 (CATHETERS) IMPLANT
CHLORAPREP W/TINT 26 (MISCELLANEOUS) ×1 IMPLANT
CLIP APPLIE ROT 10 11.4 M/L (STAPLE) ×1 IMPLANT
COVER MAYO STAND XLG (MISCELLANEOUS) ×1 IMPLANT
COVER SURGICAL LIGHT HANDLE (MISCELLANEOUS) ×1 IMPLANT
DERMABOND ADVANCED .7 DNX12 (GAUZE/BANDAGES/DRESSINGS) ×1 IMPLANT
DRAPE C-ARM 42X120 X-RAY (DRAPES) IMPLANT
ELECT REM PT RETURN 15FT ADLT (MISCELLANEOUS) ×1 IMPLANT
ENDOLOOP SUT PDS II 0 18 (SUTURE) ×1 IMPLANT
GLOVE BIO SURGEON STRL SZ7.5 (GLOVE) ×1 IMPLANT
GLOVE INDICATOR 8.0 STRL GRN (GLOVE) ×1 IMPLANT
GOWN STRL REUS W/ TWL XL LVL3 (GOWN DISPOSABLE) ×1 IMPLANT
GRASPER SUT TROCAR 14GX15 (MISCELLANEOUS) IMPLANT
HEMOSTAT SNOW SURGICEL 2X4 (HEMOSTASIS) IMPLANT
IRRIGATION SUCT STRKRFLW 2 WTP (MISCELLANEOUS) ×1 IMPLANT
IV CATH 14GX2 1/4 (CATHETERS) ×1 IMPLANT
KIT BASIN OR (CUSTOM PROCEDURE TRAY) ×1 IMPLANT
KIT TURNOVER KIT A (KITS) ×1 IMPLANT
NDL INSUFFLATION 14GA 120MM (NEEDLE) ×1 IMPLANT
NEEDLE INSUFFLATION 14GA 120MM (NEEDLE) ×1 IMPLANT
POUCH RETRIEVAL ECOSAC 10 (ENDOMECHANICALS) ×1 IMPLANT
SCISSORS LAP 5X35 DISP (ENDOMECHANICALS) ×1 IMPLANT
SET TUBE SMOKE EVAC HIGH FLOW (TUBING) ×1 IMPLANT
SLEEVE Z-THREAD 5X100MM (TROCAR) ×2 IMPLANT
SPIKE FLUID TRANSFER (MISCELLANEOUS) ×1 IMPLANT
STOPCOCK 4 WAY LG BORE MALE ST (IV SETS) IMPLANT
SUT MNCRL AB 4-0 PS2 18 (SUTURE) ×1 IMPLANT
TOWEL OR 17X26 10 PK STRL BLUE (TOWEL DISPOSABLE) ×1 IMPLANT
TRAY LAPAROSCOPIC (CUSTOM PROCEDURE TRAY) ×1 IMPLANT
TROCAR ADV FIXATION 12X100MM (TROCAR) ×1 IMPLANT
TROCAR Z-THREAD OPTICAL 5X100M (TROCAR) ×1 IMPLANT

## 2024-03-22 NOTE — Anesthesia Procedure Notes (Signed)
 Procedure Name: Intubation Date/Time: 03/22/2024 4:08 PM  Performed by: Lanning Cena RAMAN, CRNAPre-anesthesia Checklist: Patient identified, Patient being monitored, Suction available, Emergency Drugs available and Timeout performed Patient Re-evaluated:Patient Re-evaluated prior to induction Oxygen Delivery Method: Circle system utilized Preoxygenation: Pre-oxygenation with 100% oxygen Induction Type: IV induction and Rapid sequence Laryngoscope Size: 2 Grade View: Grade I Tube type: Oral Tube size: 7.0 mm Number of attempts: 1 Airway Equipment and Method: Stylet Placement Confirmation: ETT inserted through vocal cords under direct vision, positive ETCO2, CO2 detector and breath sounds checked- equal and bilateral Secured at: 19 cm Tube secured with: Tape Dental Injury: Teeth and Oropharynx as per pre-operative assessment

## 2024-03-22 NOTE — Anesthesia Preprocedure Evaluation (Addendum)
 Anesthesia Evaluation  Patient identified by MRN, date of birth, ID band Patient awake    Reviewed: Allergy & Precautions, H&P , NPO status , [name] Chart, lab work & pertinent test results  Airway Mallampati: II  TM Distance: >3 FB Neck ROM: Full    Dental  (+) Teeth Intact, Dental Advisory Given   Pulmonary former smoker Quit smoking 2023, 27 pack year history    Pulmonary exam normal breath sounds clear to auscultation       Cardiovascular negative cardio ROS Normal cardiovascular exam Rhythm:Regular Rate:Normal     Neuro/Psych  Headaches PSYCHIATRIC DISORDERS Anxiety Depression       GI/Hepatic Neg liver ROS,GERD  Controlled and Medicated,,  Endo/Other  negative endocrine ROS    Renal/GU negative Renal ROS  negative genitourinary   Musculoskeletal negative musculoskeletal ROS (+)    Abdominal   Peds negative pediatric ROS (+)  Hematology negative hematology ROS (+) Hb 14.4, plt 418   Anesthesia Other Findings Wegovy  LD:   Reproductive/Obstetrics S/p TL                              Anesthesia Physical Anesthesia Plan  ASA: 2  Anesthesia Plan: General   Post-op Pain Management: Tylenol  PO (pre-op)*, Toradol  IV (intra-op)*, Ketamine IV* and Dilaudid IV   Induction: Intravenous and Rapid sequence  PONV Risk Score and Plan: 4 or greater and Ondansetron , Dexamethasone , Midazolam , Treatment may vary due to age or medical condition and Scopolamine  patch - Pre-op  Airway Management Planned: Oral ETT  Additional Equipment: None  Intra-op Plan:   Post-operative Plan: Extubation in OR  Informed Consent: I have reviewed the patients History and Physical, chart, labs and discussed the procedure including the risks, benefits and alternatives for the proposed anesthesia with the patient or authorized representative who has indicated his/her understanding and acceptance.     Dental  advisory given  Plan Discussed with: CRNA  Anesthesia Plan Comments: (Extremely anxious- requesting anxiolysis in preop Has not been off wegovy  for 7d, will RSI )         Anesthesia Quick Evaluation

## 2024-03-22 NOTE — ED Triage Notes (Signed)
 Pt arrived reporting abdominal pain, N/V/D since this morning. Endorses pain has been constant, states she is having a gall bladder attack, has surgery scheduled next week

## 2024-03-22 NOTE — Transfer of Care (Signed)
 Immediate Anesthesia Transfer of Care Note  Patient: Heather [name]  Procedure(s) Performed: LAPAROSCOPIC CHOLECYSTECTOMY (Abdomen)  Patient Location: PACU  Anesthesia Type:General  Level of Consciousness: sedated  Airway & Oxygen Therapy: Patient Spontanous Breathing and Patient connected to face mask oxygen  Post-op Assessment: Report given to RN and Post -op Vital signs reviewed and stable  Post vital signs: Reviewed and stable  Last Vitals:  Vitals Value Taken Time  BP 107/61 03/22/24 17:35  Temp    Pulse 72 03/22/24 17:37  Resp 16 03/22/24 17:37  SpO2 100 % 03/22/24 17:37  Vitals shown include unfiled device data.  Last Pain:  Vitals:   03/22/24 1539  TempSrc:   PainSc: 0-No pain         Complications: No notable events documented.

## 2024-03-22 NOTE — Op Note (Signed)
 Patient: Heather [name] (Nov 22, 1980, [id number])  Date of Surgery: 03/22/2024  Preoperative Diagnosis: Cholecystitis   Postoperative Diagnosis: Cholecystitis   Surgical Procedure: LAPAROSCOPIC CHOLECYSTECTOMY:     Operative Team Members:  Surgeons and Role:    * Dianne Whelchel, Deward PARAS, MD - Primary   Anesthesiologist: Merla Almarie HERO, DO CRNA: Lanning Cena RAMAN, CRNA   Anesthesia: General   Fluids:  Total I/O In: 900 [I.V.:800; IV Piggyback:100] Out: 130 [Other:100; Blood:30]  Complications: None  Drains:  none   Specimen:  ID Type Source Tests Collected by Time Destination  1 : Gallbladder Tissue PATH Gallbladder SURGICAL PATHOLOGY Yuliana Vandrunen, Deward PARAS, MD 03/22/2024 1538      Disposition:  PACU - hemodynamically stable.  Plan of Care: Admit for overnight observation    Indications for Procedure: Heather [name] is a 43 y.o. female who presented with abdominal pain.  History, physical and imaging was concerning for cholecystitis.  Laparoscopic cholecystectomy was recommended for the patient.  The procedure itself, as well as the risks, benefits and alternatives were discussed with the patient.  Risks discussed included but were not limited to the risk of infection, bleeding, damage to nearby structures, need to convert to open procedure, incisional hernia, bile leak, common bile duct injury and the need for additional procedures or surgeries.  With this discussion complete and all questions answered the patient granted consent to proceed.  Findings: Posterior sectoral duct, inflamed gallbladder, innumerable small to medium gallstones  Infection status: Patient: Heather [name] [facility] Service Patient Case: Elective Infection Present At Time Of Surgery (PATOS): Cholecystitis   Description of Procedure:   On the date stated above, the patient was taken to the operating room suite and placed in supine positioning.  Sequential compression  devices were placed on the lower extremities to prevent blood clots.  General endotracheal anesthesia was induced. Preoperative antibiotics were given.  The patient's abdomen was prepped and draped in the usual sterile fashion.  A time-out was completed verifying the correct patient, procedure, positioning and equipment needed for the case.  We began by anesthetizing the skin with local anesthetic and then making a 5 mm incision just below the umbilicus.  We dissected through the subcutaneous tissues to the fascia.  The fascia was grasped and elevated using a Kocher clamp.  A Veress needle was inserted into the abdomen and the abdomen was insufflated to 15 mmHg.  A 5 mm trocar was inserted in this position under optical guidance and then the abdomen was inspected.  There was no trauma to the underlying viscera with initial trocar placement.  Any abnormal findings, other than inflammation in the right upper quadrant, are listed above in the findings section.  Three additional trocars were placed, one 12 mm trocar in the subxiphoid position, one 5 mm trocar in the midline epigastric area and one 5mm trocar in the right upper quadrant subcostally.  These were placed under direct vision without any trauma to the underlying viscera.    The patient was then placed in head up, left side down positioning.  The gallbladder was identified and dissected free from its attachments to the omentum allowing the duodenum to fall away.  The infundibulum of the gallbladder was dissected free working laterally to medially.  The cystic duct and cystic artery were dissected free from surrounding connective tissue.  The infundibulum of the gallbladder was dissected off the cystic plate.  Attempt was made to create a critical view of safety.  I was able  to isolate 2 structures and only 2 structures entering the gallbladder.  However I tried to separate the cystic artery from the cystic duct, however.  There were actually 1 structure.   I never found a dominant cystic artery.  The anatomy had the appearance of the posterior sectoral duct with the second structure connecting to the cystic duct and continuing up along the gallbladder fossa and reentering the liver.  It looked like this was 1 structure with the cystic duct once all tissues were cleared of this making me think this is a posterior sectoral duct.  It is also possible that this was arterial, giving 1 branch to the gallbladder and then continuing up to the liver.  I decided to divide the gallbladder just above this structure to avoid injury to any hepatic drainage or blood flow.  Multiple clips were placed on the cystic duct and infundibulum just above the structure.  The cystic duct was then divided and a PDS Endoloop was placed on the stump above the posterior sectoral duct.  The gallbladder was dissected off the cystic plate, placed in an endocatch bag and removed from the 12 mm subxiphoid port site.  Due to dividing the infundibulum there was some spillage of gallstones.  These were cleaned from the field using the stone scoop.  The clips were inspected and appeared effective.  The cystic plate was inspected and hemostasis was obtained using electrocautery.  A suction irrigator was used to clean the operative field.  Attention was turned to closure.  The 12 mm subxiphoid port site was closed using a 0-vicryl suture on a fascial suture passer.  The abdomen was desufflated.  The skin was closed using 4-0 monocryl and dermabond.  All sponge and needle counts were correct at the conclusion of the case.    Deward Foy, MD General, Bariatric, & Minimally Invasive Surgery [facility], [place]
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
## 2024-03-22 NOTE — ED Provider Notes (Addendum)
 [place] EMERGENCY DEPARTMENT AT [facility] Provider Note   CSN: [id number] Arrival date & time: 03/22/24  1003     Patient presents with: Abdominal Pain and Nausea   Heather [name] is a 43 y.o. female.   Patient with known history of cholelithiasis.  Patient had cholecystectomy scheduled for July 28.  But patient's been having significant right upper quadrant abdominal pain for the past 3 days.  Did contact [facility].  They recommended that she get evaluated.  Sounds as if they are planning may be for admission and doing the cholecystectomy early.  [name] had nausea and vomiting.  Past medical history significant for migraine headaches generalized anxiety disorder attention deficit disorder major depressive disorder patient is a former smoker quit 2023.       Prior to Admission medications   Medication Sig Start Date End Date Taking? Authorizing Provider  amphetamine -dextroamphetamine  (ADDERALL) 20 MG tablet Take 1 tablet (20 mg total) by mouth 2 (two) times daily. 02/27/24   Wendee Lynwood HERO, NP  clonazePAM  (KLONOPIN ) 0.5 MG tablet Take 1 tablet (0.5 mg total) by mouth daily as needed for anxiety. 02/23/24   Wendee Lynwood HERO, NP  cyanocobalamin  (VITAMIN B12) 1000 MCG/ML injection Inject 1 mL (1,000 mcg total) into the muscle every 14 (fourteen) days. Inject 1 ml 1000 mcg total into muscle once a month for three months 07/07/23   Wendee Lynwood HERO, NP  ondansetron  (ZOFRAN ) 4 MG tablet Take 1 tablet (4 mg total) by mouth every 8 (eight) hours as needed for nausea or vomiting. 11/26/23   Lavell Bari LABOR, FNP  pantoprazole  (PROTONIX ) 20 MG tablet Take 1 tablet by mouth once daily 02/23/24   Wendee Lynwood HERO, NP  predniSONE  (DELTASONE ) 20 MG tablet 2 tabs po daily for 5 days, then 1 tab po daily for 5 days 02/14/24   Copland, Jacques, MD  WEGOVY  2.4 MG/0.75ML SOAJ INJECT 2.4 MG INTO THE SKIN ONCE A WEEK 03/19/24   Wendee Lynwood HERO, NP    Allergies: Patient has no known  allergies.    Review of Systems  Constitutional:  Negative for chills and fever.  HENT:  Negative for ear pain and sore throat.   Eyes:  Negative for pain and visual disturbance.  Respiratory:  Negative for cough and shortness of breath.   Cardiovascular:  Negative for chest pain and palpitations.  Gastrointestinal:  Positive for abdominal pain, nausea and vomiting.  Genitourinary:  Negative for dysuria and hematuria.  Musculoskeletal:  Negative for arthralgias and back pain.  Skin:  Negative for color change and rash.  Neurological:  Negative for seizures and syncope.  All other systems reviewed and are negative.   Updated Vital Signs Temp 98.7 F (37.1 C) (Oral)   Physical Exam Vitals and nursing note reviewed.  Constitutional:      General: She is not in acute distress.    Appearance: Normal appearance. She is well-developed.  HENT:     Head: Normocephalic and atraumatic.     Mouth/Throat:     Mouth: Mucous membranes are moist.  Eyes:     Conjunctiva/sclera: Conjunctivae normal.  Cardiovascular:     Rate and Rhythm: Normal rate and regular rhythm.     Heart sounds: No murmur heard. Pulmonary:     Effort: Pulmonary effort is normal. No respiratory distress.     Breath sounds: Normal breath sounds.  Abdominal:     Palpations: Abdomen is soft.     Tenderness: There is abdominal  tenderness.  Musculoskeletal:        General: No swelling.     Cervical back: Normal range of motion and neck supple.  Skin:    General: Skin is warm and dry.     Capillary Refill: Capillary refill takes less than 2 seconds.  Neurological:     General: No focal deficit present.     Mental Status: She is alert and oriented to person, place, and time.  Psychiatric:        Mood and Affect: Mood normal.     (all labs ordered are listed, but only abnormal results are displayed) Labs Reviewed  LIPASE, BLOOD - Abnormal; Notable for the following components:      Result Value   Lipase 59 (*)     All other components within normal limits  CBC - Abnormal; Notable for the following components:   WBC 10.8 (*)    Platelets 418 (*)    All other components within normal limits  COMPREHENSIVE METABOLIC PANEL WITH GFR  HCG, SERUM, QUALITATIVE  PREGNANCY, URINE  URINALYSIS, ROUTINE W REFLEX MICROSCOPIC    EKG: None  Radiology: US  Abdomen Limited Result Date: 03/22/2024 CLINICAL DATA:  History of gallstones. Worsening right upper quadrant pain EXAM: ULTRASOUND ABDOMEN LIMITED RIGHT UPPER QUADRANT COMPARISON:  CT abdomen and pelvis dated 12/18/2023 FINDINGS: Gallbladder: Cholelithiasis measuring up to 1.4 cm. Positive sonographic Murphy sign noted by sonographer. Common bile duct: Diameter: 5 mm Liver: No focal lesion identified. Within normal limits in parenchymal echogenicity. Portal vein is patent on color Doppler imaging with normal direction of blood flow towards the liver. Other: None. IMPRESSION: Cholelithiasis with positive sonographic Murphy sign, suspicious for acute cholecystitis. Electronically Signed   By: Limin  Xu M.D.   On: 03/22/2024 12:13     Procedures   Medications Ordered in the ED  HYDROmorphone (DILAUDID) injection 1 mg (1 mg Intravenous Given 03/22/24 1144)  ondansetron  (ZOFRAN ) injection 4 mg (4 mg Intravenous Given 03/22/24 1148)  sodium chloride  0.9 % bolus 1,000 mL (1,000 mLs Intravenous New Bag/Given 03/22/24 1148)                                    Medical Decision Making Amount and/or Complexity of Data Reviewed Labs: ordered. Radiology: ordered.  Risk Prescription drug management.   Will repeat ultrasound.  Patient has not had ultrasound recently.  Will get CBC complete metabolic panel lipase.  Will give a liter of IV fluids antinausea medicine pain medicine.  Will plan on contacting [facility].  Ultrasound confirms gallstones.  Common bile duct not dilated.  Pregnancy test negative white count elevated some at 10.8 but not  significantly LFTs normal electrolytes normal.  They did have a positive Murphy sign.  1 biliary colic versus early cholecystitis.  Will discuss with [facility].  Lipase elevated just slightly.  But I do not think this is really pancreatitis.  Final diagnoses:  Right upper quadrant abdominal pain  Calculus of gallbladder with acute on chronic cholecystitis without obstruction    ED Discharge Orders     None          Geraldene Hamilton, MD 03/22/24 1136    Geraldene Hamilton, MD 03/22/24 1304

## 2024-03-23 ENCOUNTER — Other Ambulatory Visit: Payer: Self-pay | Admitting: Nurse Practitioner

## 2024-03-23 DIAGNOSIS — F411 Generalized anxiety disorder: Secondary | ICD-10-CM

## 2024-03-23 LAB — CBC
HCT: 41.2 % (ref 36.0–46.0)
Hemoglobin: 13.6 g/dL (ref 12.0–15.0)
MCH: 29.2 pg (ref 26.0–34.0)
MCHC: 33 g/dL (ref 30.0–36.0)
MCV: 88.6 fL (ref 80.0–100.0)
Platelets: 358 K/uL (ref 150–400)
RBC: 4.65 MIL/uL (ref 3.87–5.11)
RDW: 13.3 % (ref 11.5–15.5)
WBC: 13.4 K/uL — ABNORMAL HIGH (ref 4.0–10.5)
nRBC: 0 % (ref 0.0–0.2)

## 2024-03-23 LAB — BASIC METABOLIC PANEL WITH GFR
Anion gap: 9 (ref 5–15)
BUN: 7 mg/dL (ref 6–20)
CO2: 18 mmol/L — ABNORMAL LOW (ref 22–32)
Calcium: 9 mg/dL (ref 8.9–10.3)
Chloride: 107 mmol/L (ref 98–111)
Creatinine, Ser: 0.57 mg/dL (ref 0.44–1.00)
GFR, Estimated: >60 mL/min (ref >60)
Glucose, Bld: 145 mg/dL — ABNORMAL HIGH (ref 70–99)
Potassium: 4.1 mmol/L (ref 3.5–5.1)
Sodium: 134 mmol/L — ABNORMAL LOW (ref 135–145)

## 2024-03-23 LAB — HEPATIC FUNCTION PANEL
ALT: 49 U/L — ABNORMAL HIGH (ref 0–44)
AST: 49 U/L — ABNORMAL HIGH (ref 15–41)
Albumin: 3.6 g/dL (ref 3.5–5.0)
Alkaline Phosphatase: 69 U/L (ref 38–126)
Bilirubin, Direct: 0.2 mg/dL (ref 0.0–0.2)
Indirect Bilirubin: 0.6 mg/dL (ref 0.3–0.9)
Total Bilirubin: 0.8 mg/dL (ref 0.0–1.2)
Total Protein: 6.7 g/dL (ref 6.5–8.1)

## 2024-03-23 MED ORDER — OXYCODONE-ACETAMINOPHEN 5-325 MG PO TABS: 1.0000 | ORAL_TABLET | ORAL | 0 refills | Status: DC | PRN

## 2024-03-23 MED ORDER — METHOCARBAMOL 500 MG PO TABS: 500.0000 mg | ORAL_TABLET | Freq: Four times a day (QID) | ORAL | 0 refills | Status: DC
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
## 2024-03-23 NOTE — Discharge Instructions (Signed)
 CHOLECYSTECTOMY POST OPERATIVE INSTRUCTIONS  Thinking Clearly  The anesthesia may cause you to feel different for 1 or 2 days. Do not drive, drink alcohol, or make any big decisions for at least 2 days.  Nutrition When you wake up, you will be able to drink small amounts of liquid. If you do not feel sick, you can slowly advance your diet to regular foods. Continue to drink lots of fluids, usually about 8 to 10 glasses per day. Eat a high-fiber diet so you don't strain during bowel movements. High-Fiber Foods Foods high in fiber include beans, bran cereals and whole-grain breads, peas, dried fruit (figs, apricots, and dates), raspberries, blackberries, strawberries, sweet corn, broccoli, baked potatoes with skin, plums, pears, apples, greens, and nuts. Activity Slowly increase your activity. Be sure to get up and walk every hour or so to prevent blood clots. No heavy lifting or strenuous activity for 4 weeks following surgery to prevent hernias at your incision sites It is normal to feel tired. You may need more sleep than usual.  Get your rest but make sure to get up and move around frequently to prevent blood clots and pneumonia.  Work and Return to [employer] can go back to work when you feel well enough. Discuss the timing with your surgeon. You can usually go back to school or work 1 week after an operation. If your work requires heavy lifting or strenuous activity you need to be placed on light duty for 4 weeks following surgery. You can return to gym class, sports or other physical activities 4 weeks after surgery.  Wound Care Always wash your hands before and after touching near your incision site. Do not soak in a bathtub until cleared at your follow up appointment. You may take a shower 24 hours after surgery. A small amount of drainage from the incision is normal. If the drainage is thick and yellow or the site is red, you may have an infection, so call your surgeon. If you  have a drain in one of your incisions, it will be taken out in office when the drainage stops. Steri-Strips will fall off in 7 to 10 days or they will be removed during your first office visit. If you have dermabond glue covering over the incision, allow the glue to flake off on its own. Avoid wearing tight or rough clothing. It may rub your incisions and make it harder for them to heal. Protect the new skin, especially from the sun. The sun can burn and cause darker scarring. Your scar will heal in about 4 to 6 weeks and will become softer and continue to fade over the next year.  The cosmetic appearance of the incisions will improve over the course of the first year after surgery. Sensation around your incision will return in a few weeks or months.  Bowel Movements After intestinal surgery, you may have loose watery stools for several days. If watery diarrhea lasts longer than 3 days, contact your surgeon. Pain medication (narcotics) can cause constipation. Increase the fiber in your diet with high-fiber foods if you are constipated. You can take an over the counter stool softener like Colace to avoid constipation.  Additional over the counter medications can also be used if Colace isn't sufficient (for example, Milk of Magnesia or Miralax).  Pain The amount of pain is different for each person. Some people need only 1 to 3 doses of pain control medication, while others need more. Take alternating doses of tylenol  and ibuprofen around the clock for the first five days following surgery.  This will provide a baseline of pain control and help with inflammation.  Take the narcotic pain medication in addition if needed for severe pain.  Contact Your Surgeon at [phone], if you have: Pain in your right upper abdomen like a gallbladder attack. Pain that will not go away Pain that gets worse A fever of more than 101F (38.3C) Repeated vomiting Swelling, redness, bleeding, or bad-smelling  drainage from your wound site Strong abdominal pain No bowel movement or unable to pass gas for 3 days Watery diarrhea lasting longer than 3 days  Pain Control The goal of pain control is to minimize pain, keep you moving and help you heal. Your surgical team will work with you on your pain plan. Most often a combination of therapies and medications are used to control your pain. You may also be given medication (local anesthetic) at the surgical site. This may help control your pain for several days. Extreme pain puts extra stress on your body at a time when your body needs to focus on healing. Do not wait until your pain has reached a level "10" or is unbearable before telling your doctor or nurse. It is much easier to control pain before it becomes severe. Following a laparoscopic procedure, pain is sometimes felt in the shoulder. This is due to the gas inserted into your abdomen during the procedure. Moving and walking helps to decrease the gas and the right shoulder pain.  Use the guide below for ways to manage your post-operative pain. Learn more by going to facs.org/safepaincontrol.  How Intense Is My Pain Common Therapies to Feel Better       I hardly notice my pain, and it does not interfere with my activities.  I notice my pain and it distracts me, but I can still do activities (sitting up, walking, standing).  Non-Medication Therapies  Ice (in a bag, applied over clothing at the surgical site), elevation, rest, meditation, massage, distraction (music, TV, play) walking and mild exercise Splinting the abdomen with pillows +  Non-Opioid Medications Acetaminophen (Tylenol) Non-steroidal anti-inflammatory drugs (NSAIDS) Aspirin, Ibuprofen (Motrin, Advil) Naproxen (Aleve) Take these as needed, when you feel pain. Both acetaminophen and NSAIDs help to decrease pain and swelling (inflammation).      My pain is hard to ignore and is more noticeable even when I rest.  My  pain interferes with my usual activities.  Non-Medication Therapies  +  Non-Opioid medications  Take on a regular schedule (around-the-clock) instead of as needed. (For example, Tylenol every 6 hours at 9:00 am, 3:00 pm, 9:00 pm, 3:00 am and Motrin every 6 hours at 12:00 am, 6:00 am, 12:00 pm, 6:00 pm)         I am focused on my pain, and I am not doing my daily activities.  I am groaning in pain, and I cannot sleep. I am unable to do anything.  My pain is as bad as it could be, and nothing else matters.  Non-Medication Therapies  +  Around-the-Clock Non-Opioid Medications  +  Short-acting opioids  Opioids should be used with other medications to manage severe pain. Opioids block pain and give a feeling of euphoria (feel high). Addiction, a serious side effect of opioids, is rare with short-term (a few days) use.  Examples of short-acting opioids include: Tramadol (Ultram), Hydrocodone (Norco, Vicodin), Hydromorphone (Dilaudid), Oxycodone (Oxycontin)     The above directions have been adapted from  the [employer] of [employer] Surgical Patient Education Program.  Please refer to the ACS website if needed: FreakyMates.de.ashx.   Ivar Drape, MD [facility] [address], [address], [place], [place]  [address] ?  [address] [address], [place], [place]   [address] [phone] ? [phone] ? FAX [phone] Web site: [email or web address]

## 2024-03-23 NOTE — Progress Notes (Signed)
 Discharge instructions discussed with patient, verbalized agreement and understanding

## 2024-03-23 NOTE — Plan of Care (Signed)
  Problem: Education: Goal: Knowledge of General Education information will improve Description: Including pain rating scale, medication(s)/side effects and non-pharmacologic comfort measures Outcome: Progressing   Problem: Nutrition: Goal: Adequate nutrition will be maintained Outcome: Progressing   Problem: Pain Managment: Goal: General experience of comfort will improve and/or be controlled Outcome: Progressing

## 2024-03-24 NOTE — Anesthesia Postprocedure Evaluation (Signed)
 Anesthesia Post Note  Patient: Heather [name]  Procedure(s) Performed: LAPAROSCOPIC CHOLECYSTECTOMY (Abdomen)     Patient location during evaluation: PACU Anesthesia Type: General Level of consciousness: awake and alert, oriented and patient cooperative Pain management: pain level controlled Vital Signs Assessment: post-procedure vital signs reviewed and stable Respiratory status: spontaneous breathing, nonlabored ventilation and respiratory function stable Cardiovascular status: blood pressure returned to baseline and stable Postop Assessment: no apparent nausea or vomiting Anesthetic complications: no   No notable events documented.  Last Vitals:  Vitals:   03/23/24 0520 03/23/24 0940  BP: 105/70 91/73  Pulse: 75 76  Resp: 16 18  Temp: 36.8 C 36.8 C  SpO2: 100% 100%    Last Pain:  Vitals:   03/23/24 0940  TempSrc: Oral  PainSc:                  Heather [name]

## 2024-03-25 ENCOUNTER — Other Ambulatory Visit: Payer: Self-pay | Admitting: Nurse Practitioner

## 2024-03-25 ENCOUNTER — Encounter (HOSPITAL_COMMUNITY): Payer: Self-pay | Admitting: Surgery

## 2024-03-25 DIAGNOSIS — F909 Attention-deficit hyperactivity disorder, unspecified type: Secondary | ICD-10-CM

## 2024-03-25 MED ORDER — AMPHETAMINE-DEXTROAMPHETAMINE 20 MG PO TABS: 20.0000 mg | ORAL_TABLET | Freq: Two times a day (BID) | ORAL | 0 refills | Status: DC

## 2024-03-25 NOTE — Telephone Encounter (Signed)
 Copied from CRM [id number]. Topic: Clinical - Medication Refill >> Mar 25, 2024  8:32 AM Hodapp H wrote: Medication: amphetamine -dextroamphetamine  (ADDERALL) 20 MG tablet  Has the patient contacted their pharmacy? Yes, pharmacy calling for complete refill, partial picked up on 7/9 for 25 tablets 12.5 day supply (Agent: If no, request that the patient contact the pharmacy for the refill. If patient does not wish to contact the pharmacy document the reason why and proceed with request.) (Agent: If yes, when and what did the pharmacy advise?)  This is the patient's preferred pharmacy:  [facility] [address], [place] - [address] [facility] [place] [address] Phone: [phone] Fax: [phone]    Is this the correct pharmacy for this prescription? Yes If no, delete pharmacy and type the correct one.   Has the prescription been filled recently? Yes, pharmacy calling for complete refill, partial picked up on 7/9 for 25 tablets 12.5 day supply   Is the patient out of the medication? Yes  Has the patient been seen for an appointment in the last year OR does the patient have an upcoming appointment? Yes  Can we respond through MyChart? N/A  Agent: Please be advised that Rx refills may take up to 3 business days. We ask that you follow-up with your pharmacy.

## 2024-03-26 ENCOUNTER — Inpatient Hospital Stay (HOSPITAL_COMMUNITY)
Admission: EM | Admit: 2024-03-26 | Discharge: 2024-03-29 | DRG: 444 | Disposition: A | Discharge status: Home / Self Care | Source: Ambulatory Visit | Attending: General Surgery | Admitting: General Surgery

## 2024-03-26 ENCOUNTER — Other Ambulatory Visit: Payer: Self-pay

## 2024-03-26 ENCOUNTER — Encounter (HOSPITAL_COMMUNITY): Payer: Self-pay | Admitting: Emergency Medicine

## 2024-03-26 DIAGNOSIS — Z6827 Body mass index (BMI) 27.0-27.9, adult: Secondary | ICD-10-CM | POA: Diagnosis not present

## 2024-03-26 DIAGNOSIS — K219 Gastro-esophageal reflux disease without esophagitis: Secondary | ICD-10-CM | POA: Diagnosis present

## 2024-03-26 DIAGNOSIS — Z79899 Other long term (current) drug therapy: Secondary | ICD-10-CM

## 2024-03-26 DIAGNOSIS — R7989 Other specified abnormal findings of blood chemistry: Secondary | ICD-10-CM | POA: Diagnosis not present

## 2024-03-26 DIAGNOSIS — Z820 Family history of epilepsy and other diseases of the nervous system: Secondary | ICD-10-CM

## 2024-03-26 DIAGNOSIS — K8051 Calculus of bile duct without cholangitis or cholecystitis with obstruction: Secondary | ICD-10-CM | POA: Diagnosis not present

## 2024-03-26 DIAGNOSIS — K449 Diaphragmatic hernia without obstruction or gangrene: Secondary | ICD-10-CM | POA: Diagnosis not present

## 2024-03-26 DIAGNOSIS — Z8261 Family history of arthritis: Secondary | ICD-10-CM

## 2024-03-26 DIAGNOSIS — E669 Obesity, unspecified: Secondary | ICD-10-CM | POA: Diagnosis not present

## 2024-03-26 DIAGNOSIS — Z8349 Family history of other endocrine, nutritional and metabolic diseases: Secondary | ICD-10-CM | POA: Diagnosis not present

## 2024-03-26 DIAGNOSIS — K8050 Calculus of bile duct without cholangitis or cholecystitis without obstruction: Principal | ICD-10-CM | POA: Diagnosis present

## 2024-03-26 DIAGNOSIS — K851 Biliary acute pancreatitis without necrosis or infection: Principal | ICD-10-CM | POA: Diagnosis present

## 2024-03-26 DIAGNOSIS — Z803 Family history of malignant neoplasm of breast: Secondary | ICD-10-CM | POA: Diagnosis not present

## 2024-03-26 DIAGNOSIS — Z7985 Long-term (current) use of injectable non-insulin antidiabetic drugs: Secondary | ICD-10-CM

## 2024-03-26 DIAGNOSIS — K2289 Other specified disease of esophagus: Secondary | ICD-10-CM | POA: Diagnosis not present

## 2024-03-26 DIAGNOSIS — Z555 Less than a high school diploma: Secondary | ICD-10-CM

## 2024-03-26 DIAGNOSIS — F411 Generalized anxiety disorder: Secondary | ICD-10-CM | POA: Diagnosis present

## 2024-03-26 DIAGNOSIS — K2970 Gastritis, unspecified, without bleeding: Secondary | ICD-10-CM | POA: Diagnosis not present

## 2024-03-26 DIAGNOSIS — Z825 Family history of asthma and other chronic lower respiratory diseases: Secondary | ICD-10-CM

## 2024-03-26 DIAGNOSIS — Z87891 Personal history of nicotine dependence: Secondary | ICD-10-CM

## 2024-03-26 DIAGNOSIS — Z818 Family history of other mental and behavioral disorders: Secondary | ICD-10-CM

## 2024-03-26 DIAGNOSIS — R932 Abnormal findings on diagnostic imaging of liver and biliary tract: Secondary | ICD-10-CM | POA: Diagnosis not present

## 2024-03-26 DIAGNOSIS — K838 Other specified diseases of biliary tract: Secondary | ICD-10-CM | POA: Diagnosis not present

## 2024-03-26 DIAGNOSIS — K3189 Other diseases of stomach and duodenum: Secondary | ICD-10-CM | POA: Diagnosis not present

## 2024-03-26 DIAGNOSIS — Z811 Family history of alcohol abuse and dependence: Secondary | ICD-10-CM

## 2024-03-26 DIAGNOSIS — K859 Acute pancreatitis without necrosis or infection, unspecified: Secondary | ICD-10-CM | POA: Diagnosis not present

## 2024-03-26 DIAGNOSIS — Z813 Family history of other psychoactive substance abuse and dependence: Secondary | ICD-10-CM | POA: Diagnosis not present

## 2024-03-26 DIAGNOSIS — R748 Abnormal levels of other serum enzymes: Secondary | ICD-10-CM | POA: Diagnosis not present

## 2024-03-26 DIAGNOSIS — F909 Attention-deficit hyperactivity disorder, unspecified type: Secondary | ICD-10-CM | POA: Diagnosis not present

## 2024-03-26 DIAGNOSIS — Z9049 Acquired absence of other specified parts of digestive tract: Secondary | ICD-10-CM | POA: Diagnosis not present

## 2024-03-26 LAB — CBC
HCT: 41.5 % (ref 36.0–46.0)
Hemoglobin: 13.7 g/dL (ref 12.0–15.0)
MCH: 29.2 pg (ref 26.0–34.0)
MCHC: 33 g/dL (ref 30.0–36.0)
MCV: 88.5 fL (ref 80.0–100.0)
Platelets: 390 K/uL (ref 150–400)
RBC: 4.69 MIL/uL (ref 3.87–5.11)
RDW: 13.5 % (ref 11.5–15.5)
WBC: 8.5 K/uL (ref 4.0–10.5)
nRBC: 0 % (ref 0.0–0.2)

## 2024-03-26 LAB — COMPREHENSIVE METABOLIC PANEL WITH GFR
ALT: 248 U/L — ABNORMAL HIGH (ref 0–44)
AST: 224 U/L — ABNORMAL HIGH (ref 15–41)
Albumin: 3.7 g/dL (ref 3.5–5.0)
Alkaline Phosphatase: 250 U/L — ABNORMAL HIGH (ref 38–126)
Anion gap: 12 (ref 5–15)
BUN: 7 mg/dL (ref 6–20)
CO2: 20 mmol/L — ABNORMAL LOW (ref 22–32)
Calcium: 8.9 mg/dL (ref 8.9–10.3)
Chloride: 102 mmol/L (ref 98–111)
Creatinine, Ser: 0.4 mg/dL — ABNORMAL LOW (ref 0.44–1.00)
GFR, Estimated: >60 mL/min (ref >60)
Glucose, Bld: 121 mg/dL — ABNORMAL HIGH (ref 70–99)
Potassium: 3.2 mmol/L — ABNORMAL LOW (ref 3.5–5.1)
Sodium: 134 mmol/L — ABNORMAL LOW (ref 135–145)
Total Bilirubin: 6.7 mg/dL — ABNORMAL HIGH (ref 0.0–1.2)
Total Protein: 7.4 g/dL (ref 6.5–8.1)

## 2024-03-26 LAB — URINALYSIS, ROUTINE W REFLEX MICROSCOPIC
Glucose, UA: 50 mg/dL — AB
Ketones, ur: 80 mg/dL — AB
Leukocytes,Ua: NEGATIVE
Nitrite: NEGATIVE
Protein, ur: 30 mg/dL — AB
Specific Gravity, Urine: 1.017 (ref 1.005–1.030)
pH: 5 (ref 5.0–8.0)

## 2024-03-26 LAB — LIPASE, BLOOD: Lipase: 974 U/L — ABNORMAL HIGH (ref 11–51)

## 2024-03-26 LAB — SURGICAL PATHOLOGY

## 2024-03-26 MED ORDER — HYDROMORPHONE HCL 1 MG/ML IJ SOLN
0.5000 mg | Freq: Once | INTRAMUSCULAR | Status: AC
  Administered 2024-03-26: 0.5 mg via INTRAVENOUS
  Filled 2024-03-26: qty 1

## 2024-03-26 MED ORDER — SODIUM CHLORIDE 0.9 % IV BOLUS
1000.0000 mL | Freq: Once | INTRAVENOUS | Status: AC
  Administered 2024-03-26: 1000 mL via INTRAVENOUS

## 2024-03-26 MED ORDER — POTASSIUM CHLORIDE IN NACL 20-0.9 MEQ/L-% IV SOLN
INTRAVENOUS | Status: AC
  Filled 2024-03-26: qty 1000

## 2024-03-26 MED ORDER — ACETAMINOPHEN 650 MG RE SUPP: 650.0000 mg | Freq: Four times a day (QID) | RECTAL | Status: DC | PRN

## 2024-03-26 MED ORDER — ACETAMINOPHEN 325 MG PO TABS
650.0000 mg | ORAL_TABLET | Freq: Four times a day (QID) | ORAL | Status: DC | PRN
  Administered 2024-03-27 – 2024-03-29 (×3): 650 mg via ORAL
  Filled 2024-03-26 (×5): qty 2

## 2024-03-26 MED ORDER — ENOXAPARIN SODIUM 40 MG/0.4ML IJ SOSY
40.0000 mg | PREFILLED_SYRINGE | INTRAMUSCULAR | Status: DC
  Administered 2024-03-26: 40 mg via SUBCUTANEOUS
  Filled 2024-03-26: qty 0.4

## 2024-03-26 MED ORDER — OXYCODONE HCL 5 MG PO TABS: 5.0000 mg | ORAL_TABLET | ORAL | Status: DC | PRN

## 2024-03-26 MED ORDER — ONDANSETRON HCL 4 MG/2ML IJ SOLN
4.0000 mg | Freq: Once | INTRAMUSCULAR | Status: AC
  Administered 2024-03-26: 4 mg via INTRAVENOUS
  Filled 2024-03-26: qty 2

## 2024-03-26 MED ORDER — DIPHENHYDRAMINE HCL 25 MG PO CAPS: 25.0000 mg | ORAL_CAPSULE | Freq: Four times a day (QID) | ORAL | Status: DC | PRN

## 2024-03-26 MED ORDER — ONDANSETRON 4 MG PO TBDP
4.0000 mg | ORAL_TABLET | Freq: Four times a day (QID) | ORAL | Status: DC | PRN
  Administered 2024-03-27: 4 mg via ORAL
  Filled 2024-03-26 (×2): qty 1

## 2024-03-26 MED ORDER — METHOCARBAMOL 500 MG PO TABS
500.0000 mg | ORAL_TABLET | Freq: Three times a day (TID) | ORAL | Status: DC | PRN
Start: 2024-03-26 — End: 2024-03-29
  Administered 2024-03-29: 500 mg via ORAL
  Filled 2024-03-26 (×3): qty 1

## 2024-03-26 MED ORDER — SODIUM CHLORIDE 0.9 % IV BOLUS: 1000.0000 mL | Freq: Once | INTRAVENOUS | Status: DC

## 2024-03-26 MED ORDER — HYDROMORPHONE HCL 1 MG/ML IJ SOLN
1.0000 mg | INTRAMUSCULAR | Status: DC | PRN
  Administered 2024-03-26 – 2024-03-29 (×8): 1 mg via INTRAVENOUS
  Filled 2024-03-26 (×9): qty 1

## 2024-03-26 MED ORDER — METHOCARBAMOL 1000 MG/10ML IJ SOLN: 500.0000 mg | Freq: Three times a day (TID) | INTRAMUSCULAR | Status: DC | PRN

## 2024-03-26 MED ORDER — DIPHENHYDRAMINE HCL 50 MG/ML IJ SOLN: 25.0000 mg | Freq: Four times a day (QID) | INTRAMUSCULAR | Status: DC | PRN

## 2024-03-26 MED ORDER — ONDANSETRON HCL 4 MG/2ML IJ SOLN
4.0000 mg | Freq: Four times a day (QID) | INTRAMUSCULAR | Status: DC | PRN
  Administered 2024-03-27: 4 mg via INTRAVENOUS
  Filled 2024-03-26 (×3): qty 2

## 2024-03-26 NOTE — ED Notes (Signed)
 Patient resting in bed.

## 2024-03-26 NOTE — ED Triage Notes (Addendum)
 Patient c/o abdominal pain , nausea and vomiting x 3 days. Patient report worsening nausea , jaudice and  vomiting x 6 today. Patient report taking PRN pain and nausea medicine without relief. Patient report general surgery recommended to come to ED for further workup. Hx Cholecystectomy 4 days ago
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
## 2024-03-26 NOTE — ED Notes (Signed)
 Spoke with Courtney,RN on floor to let her know patient is coming upstairs.
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
## 2024-03-27 ENCOUNTER — Inpatient Hospital Stay (HOSPITAL_COMMUNITY): Admitting: Anesthesiology

## 2024-03-27 ENCOUNTER — Telehealth: Payer: Self-pay

## 2024-03-27 ENCOUNTER — Encounter (HOSPITAL_COMMUNITY): Admission: EM | Disposition: A | Discharge status: Home / Self Care | Payer: Self-pay | Source: Ambulatory Visit

## 2024-03-27 ENCOUNTER — Inpatient Hospital Stay (HOSPITAL_COMMUNITY)

## 2024-03-27 ENCOUNTER — Encounter (HOSPITAL_COMMUNITY): Payer: Self-pay | Admitting: Surgery

## 2024-03-27 DIAGNOSIS — R748 Abnormal levels of other serum enzymes: Secondary | ICD-10-CM | POA: Diagnosis not present

## 2024-03-27 DIAGNOSIS — R7989 Other specified abnormal findings of blood chemistry: Secondary | ICD-10-CM | POA: Diagnosis not present

## 2024-03-27 DIAGNOSIS — K2289 Other specified disease of esophagus: Secondary | ICD-10-CM

## 2024-03-27 DIAGNOSIS — K2970 Gastritis, unspecified, without bleeding: Secondary | ICD-10-CM

## 2024-03-27 DIAGNOSIS — K3189 Other diseases of stomach and duodenum: Secondary | ICD-10-CM | POA: Diagnosis not present

## 2024-03-27 DIAGNOSIS — K8051 Calculus of bile duct without cholangitis or cholecystitis with obstruction: Secondary | ICD-10-CM | POA: Diagnosis not present

## 2024-03-27 DIAGNOSIS — K8050 Calculus of bile duct without cholangitis or cholecystitis without obstruction: Secondary | ICD-10-CM | POA: Diagnosis not present

## 2024-03-27 DIAGNOSIS — K449 Diaphragmatic hernia without obstruction or gangrene: Secondary | ICD-10-CM

## 2024-03-27 DIAGNOSIS — Z9049 Acquired absence of other specified parts of digestive tract: Secondary | ICD-10-CM

## 2024-03-27 DIAGNOSIS — K859 Acute pancreatitis without necrosis or infection, unspecified: Secondary | ICD-10-CM

## 2024-03-27 DIAGNOSIS — K838 Other specified diseases of biliary tract: Secondary | ICD-10-CM

## 2024-03-27 DIAGNOSIS — K839 Disease of biliary tract, unspecified: Secondary | ICD-10-CM

## 2024-03-27 DIAGNOSIS — K8020 Calculus of gallbladder without cholecystitis without obstruction: Secondary | ICD-10-CM

## 2024-03-27 DIAGNOSIS — R932 Abnormal findings on diagnostic imaging of liver and biliary tract: Secondary | ICD-10-CM | POA: Diagnosis not present

## 2024-03-27 DIAGNOSIS — K851 Biliary acute pancreatitis without necrosis or infection: Secondary | ICD-10-CM

## 2024-03-27 DIAGNOSIS — R7401 Elevation of levels of liver transaminase levels: Secondary | ICD-10-CM | POA: Diagnosis not present

## 2024-03-27 DIAGNOSIS — R188 Other ascites: Secondary | ICD-10-CM | POA: Diagnosis not present

## 2024-03-27 DIAGNOSIS — R17 Unspecified jaundice: Secondary | ICD-10-CM | POA: Diagnosis not present

## 2024-03-27 HISTORY — PX: STONE EXTRACTION WITH BASKET: SHX5318

## 2024-03-27 LAB — COMPREHENSIVE METABOLIC PANEL WITH GFR
ALT: 198 U/L — ABNORMAL HIGH (ref 0–44)
AST: 147 U/L — ABNORMAL HIGH (ref 15–41)
Albumin: 3.3 g/dL — ABNORMAL LOW (ref 3.5–5.0)
Alkaline Phosphatase: 214 U/L — ABNORMAL HIGH (ref 38–126)
Anion gap: 8 (ref 5–15)
BUN: 6 mg/dL (ref 6–20)
CO2: 21 mmol/L — ABNORMAL LOW (ref 22–32)
Calcium: 8.6 mg/dL — ABNORMAL LOW (ref 8.9–10.3)
Chloride: 109 mmol/L (ref 98–111)
Creatinine, Ser: 0.44 mg/dL (ref 0.44–1.00)
GFR, Estimated: >60 mL/min (ref >60)
Glucose, Bld: 82 mg/dL (ref 70–99)
Potassium: 3.6 mmol/L (ref 3.5–5.1)
Sodium: 138 mmol/L (ref 135–145)
Total Bilirubin: 3.3 mg/dL — ABNORMAL HIGH (ref 0.0–1.2)
Total Protein: 6.3 g/dL — ABNORMAL LOW (ref 6.5–8.1)

## 2024-03-27 LAB — LIPASE, BLOOD: Lipase: 84 U/L — ABNORMAL HIGH (ref 11–51)

## 2024-03-27 LAB — BILIRUBIN, DIRECT: Bilirubin, Direct: 1.8 mg/dL — ABNORMAL HIGH (ref 0.0–0.2)

## 2024-03-27 LAB — HIV ANTIBODY (ROUTINE TESTING W REFLEX): HIV Screen 4th Generation wRfx: NONREACTIVE

## 2024-03-27 SURGERY — ERCP, WITH LITHROTRIPSY OR REMOVAL OF COMMON BILE DUCT CALCULUS USING BASKET
Anesthesia: General

## 2024-03-27 MED ORDER — PANTOPRAZOLE SODIUM 40 MG IV SOLR
40.0000 mg | INTRAVENOUS | Status: DC
  Administered 2024-03-27 – 2024-03-29 (×3): 40 mg via INTRAVENOUS
  Filled 2024-03-27 (×3): qty 10

## 2024-03-27 MED ORDER — GADOBUTROL 1 MMOL/ML IV SOLN
6.0000 mL | Freq: Once | INTRAVENOUS | Status: AC | PRN
  Administered 2024-03-27: 6 mL via INTRAVENOUS

## 2024-03-27 MED ORDER — KETAMINE HCL 10 MG/ML IJ SOLN
INTRAMUSCULAR | Status: DC | PRN
  Administered 2024-03-27: 10 mg via INTRAVENOUS

## 2024-03-27 MED ORDER — MIDAZOLAM HCL 2 MG/2ML IJ SOLN
INTRAMUSCULAR | Status: DC | PRN
Start: 2024-03-27 — End: 2024-03-27
  Administered 2024-03-27: 1 mg via INTRAVENOUS
  Administered 2024-03-27 (×2): .5 mg via INTRAVENOUS

## 2024-03-27 MED ORDER — DICLOFENAC SUPPOSITORY 100 MG
RECTAL | Status: DC | PRN
  Administered 2024-03-27: 100 mg via RECTAL

## 2024-03-27 MED ORDER — GLUCAGON HCL RDNA (DIAGNOSTIC) 1 MG IJ SOLR
INTRAMUSCULAR | Status: DC | PRN
  Administered 2024-03-27 (×3): .25 mg via INTRAVENOUS

## 2024-03-27 MED ORDER — SODIUM CHLORIDE 0.9 % IV SOLN
INTRAVENOUS | Status: DC | PRN
  Administered 2024-03-27: 100 mL

## 2024-03-27 MED ORDER — HYDROCODONE-ACETAMINOPHEN 5-325 MG PO TABS
1.0000 | ORAL_TABLET | ORAL | Status: DC | PRN
  Administered 2024-03-27: 1 via ORAL
  Administered 2024-03-28: 2 via ORAL
  Filled 2024-03-27 (×4): qty 1

## 2024-03-27 MED ORDER — DICLOFENAC SUPPOSITORY 100 MG
RECTAL | Status: AC
  Filled 2024-03-27: qty 1

## 2024-03-27 MED ORDER — PROPOFOL 10 MG/ML IV BOLUS
INTRAVENOUS | Status: DC | PRN
  Administered 2024-03-27: 140 mg via INTRAVENOUS

## 2024-03-27 MED ORDER — GLUCAGON HCL RDNA (DIAGNOSTIC) 1 MG IJ SOLR
INTRAMUSCULAR | Status: AC
  Filled 2024-03-27: qty 1

## 2024-03-27 MED ORDER — SUGAMMADEX SODIUM 200 MG/2ML IV SOLN
INTRAVENOUS | Status: DC | PRN
  Administered 2024-03-27: 200 mg via INTRAVENOUS

## 2024-03-27 MED ORDER — LACTATED RINGERS IV SOLN
INTRAVENOUS | Status: AC | PRN
  Administered 2024-03-27: 500 mL via INTRAVENOUS

## 2024-03-27 MED ORDER — CIPROFLOXACIN IN D5W 400 MG/200ML IV SOLN
INTRAVENOUS | Status: AC
  Filled 2024-03-27: qty 200

## 2024-03-27 MED ORDER — ONDANSETRON HCL 4 MG/2ML IJ SOLN
INTRAMUSCULAR | Status: DC | PRN
  Administered 2024-03-27: 4 mg via INTRAVENOUS

## 2024-03-27 MED ORDER — AMISULPRIDE (ANTIEMETIC) 5 MG/2ML IV SOLN
INTRAVENOUS | Status: DC | PRN
  Administered 2024-03-27: 10 mg via INTRAVENOUS

## 2024-03-27 MED ORDER — AMISULPRIDE (ANTIEMETIC) 5 MG/2ML IV SOLN
INTRAVENOUS | Status: AC
  Filled 2024-03-27: qty 4

## 2024-03-27 MED ORDER — SODIUM CHLORIDE 0.9 % IV SOLN: INTRAVENOUS | Status: DC

## 2024-03-27 MED ORDER — PHENYLEPHRINE HCL (PRESSORS) 10 MG/ML IV SOLN
INTRAVENOUS | Status: DC | PRN
Start: 2024-03-27 — End: 2024-03-27
  Administered 2024-03-27: 80 ug via INTRAVENOUS
  Administered 2024-03-27 (×2): 160 ug via INTRAVENOUS
  Administered 2024-03-27: 100 ug via INTRAVENOUS

## 2024-03-27 MED ORDER — MIDAZOLAM HCL 2 MG/2ML IJ SOLN
INTRAMUSCULAR | Status: AC
  Filled 2024-03-27: qty 2

## 2024-03-27 MED ORDER — PROPOFOL 10 MG/ML IV BOLUS
INTRAVENOUS | Status: AC
  Filled 2024-03-27: qty 20

## 2024-03-27 MED ORDER — SUCCINYLCHOLINE CHLORIDE 200 MG/10ML IV SOSY
PREFILLED_SYRINGE | INTRAVENOUS | Status: DC | PRN
  Administered 2024-03-27: 120 mg via INTRAVENOUS

## 2024-03-27 MED ORDER — CIPROFLOXACIN IN D5W 400 MG/200ML IV SOLN
INTRAVENOUS | Status: DC | PRN
  Administered 2024-03-27: 400 mg via INTRAVENOUS

## 2024-03-27 MED ORDER — FENTANYL CITRATE (PF) 100 MCG/2ML IJ SOLN
INTRAMUSCULAR | Status: AC
  Filled 2024-03-27: qty 2

## 2024-03-27 MED ORDER — KETAMINE HCL 10 MG/ML IJ SOLN
INTRAMUSCULAR | Status: AC
Start: 2024-03-27 — End: 2024-03-27
  Filled 2024-03-27: qty 1

## 2024-03-27 MED ORDER — FENTANYL CITRATE (PF) 100 MCG/2ML IJ SOLN
INTRAMUSCULAR | Status: DC | PRN
  Administered 2024-03-27: 50 ug via INTRAVENOUS
  Administered 2024-03-27 (×2): 25 ug via INTRAVENOUS

## 2024-03-27 MED ORDER — DICLOFENAC SUPPOSITORY 100 MG
100.0000 mg | Freq: Once | RECTAL | Status: DC
  Filled 2024-03-27: qty 1

## 2024-03-27 MED ORDER — EPHEDRINE SULFATE (PRESSORS) 50 MG/ML IJ SOLN
INTRAMUSCULAR | Status: DC | PRN
  Administered 2024-03-27: 10 mg via INTRAVENOUS
  Administered 2024-03-27: 5 mg via INTRAVENOUS

## 2024-03-27 MED ORDER — LIDOCAINE HCL (CARDIAC) PF 100 MG/5ML IV SOSY
PREFILLED_SYRINGE | INTRAVENOUS | Status: DC | PRN
  Administered 2024-03-27: 80 mg via INTRAVENOUS

## 2024-03-27 MED ORDER — KETAMINE HCL 10 MG/ML IJ SOLN: INTRAMUSCULAR | Status: DC | PRN

## 2024-03-27 MED ORDER — ROCURONIUM BROMIDE 100 MG/10ML IV SOLN
INTRAVENOUS | Status: DC | PRN
  Administered 2024-03-27: 30 mg via INTRAVENOUS
  Administered 2024-03-27: 20 mg via INTRAVENOUS

## 2024-03-27 MED ORDER — DEXAMETHASONE SODIUM PHOSPHATE 10 MG/ML IJ SOLN
INTRAMUSCULAR | Status: DC | PRN
  Administered 2024-03-27: 4 mg via INTRAVENOUS

## 2024-03-27 NOTE — Telephone Encounter (Signed)
KUB and labs have been entered

## 2024-03-27 NOTE — Progress Notes (Signed)
 [facility] Progress Note  * Day of Surgery *  Subjective: CC:  Cc epigastric discomfort and nausea. Denies a BM since her lap chole. Husband is at bedside.  Objective: Vital signs in last 24 hours: Temp:  [97.6 F (36.4 C)-98.8 F (37.1 C)] 97.6 F (36.4 C) (07/23 1348) Pulse Rate:  [69-104] 89 (07/23 1348) Resp:  [9-20] 16 (07/23 1348) BP: (103-128)/(46-79) 120/68 (07/23 1348) SpO2:  [96 %-100 %] 98 % (07/23 1348) Weight:  [36 kg] 63 kg (07/22 1718)    Intake/Output from previous day: 07/22 0701 - 07/23 0700 In: 638.7 [P.O.:360; I.V.:278.7] Out: -  Intake/Output this shift: Total I/O In: 1100 [I.V.:900; IV Piggyback:200] Out: 10 [Blood:10]  PE: Gen:  Alert, NAD, pleasant Card:  Regular rate and rhythm Pulm:  Normal effort ORA Abd: Soft, mild ttp epigastric region, port sites c/d/I  Skin: warm and dry, no rashes  Psych: A&Ox3   Lab Results:  Recent Labs    03/26/24 1734  WBC 8.5  HGB 13.7  HCT 41.5  PLT 390   BMET Recent Labs    03/26/24 1734 03/27/24 0512  NA 134* 138  K 3.2* 3.6  CL 102 109  CO2 20* 21*  GLUCOSE 121* 82  BUN 7 6  CREATININE 0.40* 0.44  CALCIUM 8.9 8.6*   PT/INR No results for input(s): LABPROT, INR in the last 72 hours. CMP     Component Value Date/Time   NA 138 03/27/2024 0512   NA 135 04/11/2019 1119   K 3.6 03/27/2024 0512   CL 109 03/27/2024 0512   CO2 21 (L) 03/27/2024 0512   GLUCOSE 82 03/27/2024 0512   BUN 6 03/27/2024 0512   BUN 13 04/11/2019 1119   CREATININE 0.44 03/27/2024 0512   CREATININE 0.56 12/25/2015 1116   CALCIUM 8.6 (L) 03/27/2024 0512   PROT 6.3 (L) 03/27/2024 0512   PROT 6.8 04/11/2019 1119   ALBUMIN 3.3 (L) 03/27/2024 0512   ALBUMIN 4.5 04/11/2019 1119   AST 147 (H) 03/27/2024 0512   ALT 198 (H) 03/27/2024 0512   ALKPHOS 214 (H) 03/27/2024 0512   BILITOT 3.3 (H) 03/27/2024 0512   BILITOT 0.3 04/11/2019 1119   GFRNONAA >60 03/27/2024 0512   GFRAA >60 06/01/2020 1353    Lipase     Component Value Date/Time   LIPASE 84 (H) 03/27/2024 0512       Studies/Results: DG C-Arm 1-60 Min-No Report Result Date: 03/27/2024 Fluoroscopy was utilized by the requesting physician.  No radiographic interpretation.   DG C-Arm 1-60 Min-No Report Result Date: 03/27/2024 Fluoroscopy was utilized by the requesting physician.  No radiographic interpretation.   MR ABDOMEN MRCP W WO CONTAST Result Date: 03/27/2024 CLINICAL DATA:  Status post laparoscopic cholecystectomy 03/22/2024. Now with jaundice. Common bile duct stone suspected. EXAM: MRI ABDOMEN WITHOUT AND WITH CONTRAST (INCLUDING MRCP) TECHNIQUE: Multiplanar multisequence MR imaging of the abdomen was performed both before and after the administration of intravenous contrast. Heavily T2-weighted images of the biliary and pancreatic ducts were obtained, and three-dimensional MRCP images were rendered by post processing. CONTRAST:  6mL GADAVIST  GADOBUTROL  1 MMOL/ML IV SOLN COMPARISON:  CT scan 12/18/2023. FINDINGS: Lower chest: Trace symmetric pleural effusions. Hepatobiliary: No suspicious focal abnormality within the liver parenchyma. 5 mm T2 hyperintensity without enhancement in the lateral segment left liver is compatible with a tiny cyst. Mild intrahepatic biliary duct dilatation evident. 3.5 x 2.0 x 4.4 cm fluid collection is identified in the gallbladder fossa showing subtle peripheral enhancement.  MRCP imaging confirms intra and extrahepatic biliary duct dilatation. Common duct measures on the order of 11 mm diameter proximal to the confluence with the cystic duct. Common bile duct measures 9 mm diameter proximally and 10 mm diameter just proximal to the ampulla. There are multiple tiny layering distal common bile duct stones along the dependent wall well demonstrated on axial T2 image 20 of series 4, coronal T2 haste image 18 of series 3, and coronal MRCP image 49 of series 7. Pancreas: No focal mass lesion. No dilatation  of the main duct. No intraparenchymal cyst. No peripancreatic edema. Spleen:  No splenomegaly. No suspicious focal mass lesion. Adrenals/Urinary Tract: No adrenal nodule or mass. Kidneys unremarkable. Stomach/Bowel: Stomach is unremarkable. No gastric wall thickening. No evidence of outlet obstruction. Duodenum is normally positioned as is the ligament of Treitz. No small bowel or colonic dilatation within the visualized abdomen. Vascular/Lymphatic: No abdominal aortic aneurysm. No abdominal lymphadenopathy. Other: Trace free fluid is seen at the inferior tip of the right liver. Musculoskeletal: No focal suspicious marrow enhancement within the visualized bony anatomy. IMPRESSION: 1. MRCP imaging confirms intra and extrahepatic biliary duct dilatation with multiple dependent tiny layering distal common bile duct stones. 2. 3.5 x 2.0 x 4.4 cm fluid collection in the gallbladder fossa showing subtle peripheral enhancement. Imaging features are nonspecific and could be postoperative seroma or hematoma. Biloma also a consideration. Evolving abscess at postoperative day 5 not excluded. Electronically Signed   By: Camellia Candle M.D.   On: 03/27/2024 07:50   MR 3D Recon At Scanner Result Date: 03/27/2024 CLINICAL DATA:  Status post laparoscopic cholecystectomy 03/22/2024. Now with jaundice. Common bile duct stone suspected. EXAM: MRI ABDOMEN WITHOUT AND WITH CONTRAST (INCLUDING MRCP) TECHNIQUE: Multiplanar multisequence MR imaging of the abdomen was performed both before and after the administration of intravenous contrast. Heavily T2-weighted images of the biliary and pancreatic ducts were obtained, and three-dimensional MRCP images were rendered by post processing. CONTRAST:  6mL GADAVIST  GADOBUTROL  1 MMOL/ML IV SOLN COMPARISON:  CT scan 12/18/2023. FINDINGS: Lower chest: Trace symmetric pleural effusions. Hepatobiliary: No suspicious focal abnormality within the liver parenchyma. 5 mm T2 hyperintensity without  enhancement in the lateral segment left liver is compatible with a tiny cyst. Mild intrahepatic biliary duct dilatation evident. 3.5 x 2.0 x 4.4 cm fluid collection is identified in the gallbladder fossa showing subtle peripheral enhancement. MRCP imaging confirms intra and extrahepatic biliary duct dilatation. Common duct measures on the order of 11 mm diameter proximal to the confluence with the cystic duct. Common bile duct measures 9 mm diameter proximally and 10 mm diameter just proximal to the ampulla. There are multiple tiny layering distal common bile duct stones along the dependent wall well demonstrated on axial T2 image 20 of series 4, coronal T2 haste image 18 of series 3, and coronal MRCP image 49 of series 7. Pancreas: No focal mass lesion. No dilatation of the main duct. No intraparenchymal cyst. No peripancreatic edema. Spleen:  No splenomegaly. No suspicious focal mass lesion. Adrenals/Urinary Tract: No adrenal nodule or mass. Kidneys unremarkable. Stomach/Bowel: Stomach is unremarkable. No gastric wall thickening. No evidence of outlet obstruction. Duodenum is normally positioned as is the ligament of Treitz. No small bowel or colonic dilatation within the visualized abdomen. Vascular/Lymphatic: No abdominal aortic aneurysm. No abdominal lymphadenopathy. Other: Trace free fluid is seen at the inferior tip of the right liver. Musculoskeletal: No focal suspicious marrow enhancement within the visualized bony anatomy. IMPRESSION: 1. MRCP imaging confirms intra and  extrahepatic biliary duct dilatation with multiple dependent tiny layering distal common bile duct stones. 2. 3.5 x 2.0 x 4.4 cm fluid collection in the gallbladder fossa showing subtle peripheral enhancement. Imaging features are nonspecific and could be postoperative seroma or hematoma. Biloma also a consideration. Evolving abscess at postoperative day 5 not excluded. Electronically Signed   By: Camellia Candle M.D.   On: 03/27/2024 07:50     Anti-infectives: Anti-infectives (From admission, onward)    None        Assessment/Plan Choledocholithiasis Elevated lipase Post-operative fluid collection S/p  lap chole 7/18 by Dr. Lyndel  Returned to the hospital 7/22 w/ epigastric pain, nausea, and jaundice, found to have choledocholithiasis, elevated lipase, and small fluid collection in GB fossa.  -  MRCP w/ multiple CBD stones, biliary dilation, 3 x 2 x 4 cm fluid collection  - ERCP 7/23 Dr. Wilhelmenia w/ successful removal of stones, placement temporary pancreatic stent; no extrav seen on cholangiogram from cystic duct.  - will discuss with my attending if abx should be continued for fluid collection GB fossa  - will hold lovenox  for 48 h per GI request - allow FLD today, advance tomorrow pending no clinical evidence of pancreatitis     LOS: 1 day   I reviewed nursing notes, Consultant GI notes, last 24 h vitals and pain scores, last 48 h intake and output, last 24 h labs and trends, and last 24 h imaging results.  This care required moderate level of medical decision making.   Almarie Pringle, PA-C [facility] Please see Amion for pager number during day hours 7:00am-4:30pm

## 2024-03-27 NOTE — Anesthesia Preprocedure Evaluation (Addendum)
 Anesthesia Evaluation  Patient identified by MRN, date of birth, ID band Patient awake    Reviewed: Allergy & Precautions, NPO status , [name] Chart, lab work & pertinent test results  Airway Mallampati: II  TM Distance: >3 FB Neck ROM: Full    Dental no notable dental hx. (+) Teeth Intact, Dental Advisory Given   Pulmonary former smoker   Pulmonary exam normal breath sounds clear to auscultation       Cardiovascular negative cardio ROS Normal cardiovascular exam Rhythm:Regular Rate:Normal     Neuro/Psych  PSYCHIATRIC DISORDERS Anxiety Depression     Neuromuscular disease    GI/Hepatic ,GERD  ,,  Endo/Other    Renal/GU Lab Results      Component                Value               Date                         K                        3.6                 03/27/2024               CREATININE               0.44                03/27/2024                GFRNONAA                 >60                 03/27/2024                CALCIUM                  8.6 (L)             03/27/2024                ALBUMIN                  3.3 (L)             03/27/2024                   Musculoskeletal   Abdominal   Peds  Hematology Lab Results      Component                Value               Date                      WBC                      8.5                 03/26/2024                HGB                      13.7                03/26/2024  HCT                      41.5                03/26/2024                MCV                      88.5                03/26/2024                PLT                      390                 03/26/2024              Anesthesia Other Findings On Wegovy   Reproductive/Obstetrics negative OB ROS                              Anesthesia Physical Anesthesia Plan  ASA: 2  Anesthesia Plan: General   Post-op Pain Management: Ofirmev  IV (intra-op)*   Induction:  Intravenous, Cricoid pressure planned, Rapid sequence and Inhalational  PONV Risk Score and Plan: Treatment may vary due to age or medical condition and Ondansetron   Airway Management Planned: Oral ETT  Additional Equipment: None  Intra-op Plan:   Post-operative Plan: Extubation in OR  Informed Consent: I have reviewed the patients History and Physical, chart, labs and discussed the procedure including the risks, benefits and alternatives for the proposed anesthesia with the patient or authorized representative who has indicated his/her understanding and acceptance.     Dental advisory given  Plan Discussed with: CRNA and Surgeon  Anesthesia Plan Comments: (ERCP for Choledocolithiasis and elevated LFT)         Anesthesia Quick Evaluation

## 2024-03-27 NOTE — Plan of Care (Signed)
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
## 2024-03-27 NOTE — Transfer of Care (Signed)
 Immediate Anesthesia Transfer of Care Note  Patient: Heather [name]  Procedure(s) Performed: ERCP, WITH LITHROTRIPSY OR REMOVAL OF COMMON BILE DUCT CALCULUS USING BASKET  Patient Location: PACU and Endoscopy Unit  Anesthesia Type:General  Level of Consciousness: oriented, drowsy, and patient cooperative  Airway & Oxygen Therapy: Patient Spontanous Breathing and Patient connected to face mask oxygen  Post-op Assessment: Report given to RN and Post -op Vital signs reviewed and stable  Post vital signs: Reviewed and stable  Last Vitals:  Vitals Value Taken Time  BP 120/62 03/27/24 13:00  Temp 36.7 C 03/27/24 12:57  Pulse 89 03/27/24 13:00  Resp 12 03/27/24 13:00  SpO2 100 % 03/27/24 13:00  Vitals shown include unfiled device data.  Last Pain:  Vitals:   03/27/24 1257  TempSrc: Temporal  PainSc:          Complications: No notable events documented.

## 2024-03-27 NOTE — Op Note (Signed)
 [facility] Patient Name: Heather [name] Procedure Date: 03/27/2024 MRN: [id number] Attending MD: Aloha Finner , MD, [id number] Date of Birth: 1981-06-01 CSN: [id number] Age: 43 Admit Type: Inpatient Procedure:                ERCP Indications:              Common bile duct stone(s), Abnormal MRCP, Exclusion                            of bile leak, Jaundice, Elevated liver enzymes Providers:                Aloha Finner, MD, Randall Lines, RN, Curtistine Bishop, Technician Referring MD:             Inpatient surgical team and inpatient GI team Medicines:                General Anesthesia, Cipro  400 mg IV, Diclofenac  100                            mg rectal, Glucagon  0.75 mg IV Complications:            No immediate complications. Estimated Blood Loss:     Estimated blood loss was minimal. Procedure:                Pre-Anesthesia Assessment:                           - Prior to the procedure, a History and Physical                            was performed, and patient medications and                            allergies were reviewed. The patient's tolerance of                            previous anesthesia was also reviewed. The risks                            and benefits of the procedure and the sedation                            options and risks were discussed with the patient.                            All questions were answered, and informed consent                            was obtained. Prior Anticoagulants: The patient has                            taken Lovenox  (enoxaparin ), last dose was 1 day  prior to procedure. ASA Grade Assessment: II - A                            patient with mild systemic disease. After reviewing                            the risks and benefits, the patient was deemed in                            satisfactory condition to undergo the procedure.                            After obtaining informed consent, the scope was                            passed under direct vision. Throughout the                            procedure, the patient's blood pressure, pulse, and                            oxygen saturations were monitored continuously. The                            [employer] D single use                            duodenoscope was introduced through the mouth, and                            used to inject contrast into and used to inject                            contrast into the bile duct and ventral pancreatic                            duct. The ERCP was accomplished without difficulty.                            The patient tolerated the procedure. Scope In: Scope Out: Findings:      A scout film of the abdomen was obtained. Surgical clips, consistent       with a previous cholecystectomy, were seen in the area of the right       upper quadrant of the abdomen.      The upper GI tract was traversed under direct vision without detailed       examination. The Z-line was irregular and was found 38 cm from the       incisors. A 2 cm hiatal hernia was present. Patchy mildly erythematous       mucosa without bleeding was found in the entire examined stomach. No       gross lesions were noted in the duodenal bulb, in the first portion of       the duodenum and in the second  portion of the duodenum. The major       papilla was normal (though laterally displaced). In short position, it       was difficult to obtain cannulation. We transitioned into a long       position which better approximated the major papilla enfos. First       attempt biliary cannulation was not successful while using a wire-guided       approach. This led to placement of the wire within the pancreatic duct.       Decision was made to pursue a double-wire approach, as result of more       difficult attempt at biliary cannulation. The 0.025 revolution Raymon        was left within the pancreatic duct.      A second short 0.025 inch revolution Raymon was passed into the biliary       tree, while maintaining long position. The Jagtome sphincterotome was       passed over the guidewire and the bile duct was then deeply cannulated.      Contrast was injected. I personally interpreted the bile duct and       pancreatic duct images. Ductal flow of contrast was adequate. Image       quality was adequate. Contrast extended to the hepatic ducts.       Opacification of the entire biliary tree except for the gallbladder was       successful. The lower third of the main bile duct contained a localized       shelflike deformity/irregularity within this area there appeared to be       multiple filling defects. The main bile duct contained filling defects       thought to be stones and sludge. The main bile duct was moderately       dilated, as a result of the stones causing an obstruction. The largest       diameter was 13 mm. A 6 mm biliary sphincterotomy was made with a       monofilament Jagtome sphincterotome using ERBE electrocautery. There was       no post-sphincterotomy bleeding. To discover objects, the biliary tree       was swept with a retrieval balloon. Sludge was swept from the duct. 3       stones were removed. Many stones remained. Dilation of the common bile       duct with an 04-13-09 mm x 3 cm CRE balloon (to a maximum balloon size of       10 mm) dilator was successful as a balloon sphincteroplasty for total of       3 minutes going slowly from 8 to 9 to 10 mm. Mild oozing was present       post dilation/sphincteroplasty which subsided on its own. To discover       objects, the biliary tree was swept with a retrieval balloon. Sludge was       swept from the duct. Many more stones were removed (at least 15 stones).       No stones remained. An occlusion cholangiogram was performed that showed       no further significant biliary pathology. I very  carefully evaluated the       distal duct to evaluate for any signs of extravasation anywhere along       the biliary tree and the distal shelflike irregularity, and did not  appreciate this.      A formal pancreatogram was not performed. One 4 Fr by 7 cm temporary       plastic pancreatic stent with a single external pigtail was placed into       the ventral pancreatic duct to decrease PEP. The stent was in good       position.      The duodenoscope was withdrawn from the patient. Impression:               - Z-line irregular, 38 cm from the incisors.                           - 2 cm hiatal hernia.                           - Erythematous mucosa in the stomach. Biopsied for                            HP evaluation.                           - No gross lesions in the duodenal bulb, in the                            first portion of the duodenum and in the second                            portion of the duodenum.                           - The major papilla appeared normal but was                            laterally displaced requiring long position for                            better evaluation.                           - Double wire technique required for biliary                            cannulation.                           - Multiple filling defects consistent with stones                            and sludge were seen on the cholangiogram.                           - There is a shelflike deformity in the distal                            common bile duct just proximal to the biliary  orifice. Suspect this occurred as a result of                            stone/debris distally.                           - The entire main bile duct was moderately dilated.                           - Choledocholithiasis was found. Partial removal                            was accomplished by biliary sphincterotomy and                            balloon sweep  initially. Subsequently balloon                            sphincteroplasty performed. Remaining stones were                            extracted.                           - No evidence of contrast extravasation in the area                            of the cystic duct or in the bile duct itself                            including the area of distal shelflike deformity.                           - One temporary plastic pancreatic stent was placed                            into the ventral pancreatic duct to decrease PEP. Moderate Sedation:      Not Applicable - Patient had care per Anesthesia. Recommendation:           - The patient will be observed post-procedure,                            until all discharge criteria are met.                           - Discharge patient to home.                           - Patient has a contact number available for                            emergencies. The signs and symptoms of potential                            delayed complications were  discussed with the                            patient. Return to normal activities tomorrow.                            Written discharge instructions were provided to the                            patient.                           - High fiber diet.                           - Watch for pancreatitis, bleeding, perforation,                            and cholangitis.                           - Observe patient's clinical course.                           - Continue once daily PPI.                           - Hold Lovenox /VTE prophylaxis for 48 hours to                            decrease risk of post interventional bleeding.                           - Await path results.                           - Consideration of whether patient continues to                            have abdominal pain and discomfort a result of                            underlying pancreatitis which she presented with,                             versus potential of any postprocedural                            complications. If she has further issues with pain                            that is uncontrollable, will need to consider                            cross-sectional imaging.                           -  Other consideration is the gallbladder fossa                            fluid collection which is not clearly defined as a                            biliary leak at this time. Thus no stent have been                            placed. This may require aspiration or drain                            placement in future. Will need to be monitored.                           - Patient will need a KUB 2-view in 10-14 days to                            ensure pancreatic stent has migrated successfully.                            If still present at that time will need to be                            scheduled for EGD with stent pull.                           - The findings and recommendations were discussed                            with the patient.                           - The findings and recommendations were discussed                            with the patient's family. Procedure Code(s):        --- Professional ---                           [id number] [id number], Endoscopic retrograde                            cholangiopancreatography (ERCP); with placement of                            endoscopic stent into biliary or pancreatic duct,                            including pre- and post-dilation and guide wire                            passage, when performed, including sphincterotomy,  when performed, each stent                           43277, 59, Endoscopic retrograde                            cholangiopancreatography (ERCP); with                            trans-endoscopic balloon dilation of                            biliary/pancreatic duct(s) or of ampulla                             (sphincteroplasty), including sphincterotomy, when                            performed, each duct                           43264, Endoscopic retrograde                            cholangiopancreatography (ERCP); with removal of                            calculi/debris from biliary/pancreatic duct(s)                           74330, 26, Combined endoscopic catheterization of                            the biliary and pancreatic ductal systems,                            radiological supervision and interpretation Diagnosis Code(s):        --- Professional ---                           K22.89, Other specified disease of esophagus                           K44.9, Diaphragmatic hernia without obstruction or                            gangrene                           K31.89, Other diseases of stomach and duodenum                           R93.2, Abnormal findings on diagnostic imaging of                            liver and biliary tract  K80.51, Calculus of bile duct without cholangitis                            or cholecystitis with obstruction                           R17, Unspecified jaundice                           R74.8, Abnormal levels of other serum enzymes CPT copyright 2022 [employer]. All rights reserved. The codes documented in this report are preliminary and upon coder review may  be revised to meet current compliance requirements. Aloha Finner, MD 03/27/2024 12:57:28 PM Number of Addenda: 0
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
## 2024-03-27 NOTE — Progress Notes (Signed)
 [name] IV edematous and hurting from IV fluids during 1800 on dayshift. Patient requesting IV removal.   Removed IV, patient not wanting new IV at this time, especially since No IV fluids ordered.

## 2024-03-27 NOTE — Anesthesia Procedure Notes (Signed)
 Procedure Name: Intubation Date/Time: 03/27/2024 11:13 AM  Performed by: Kathern Rollene LABOR, CRNAPre-anesthesia Checklist: Patient identified, Emergency Drugs available, Suction available and Patient being monitored Patient Re-evaluated:Patient Re-evaluated prior to induction Oxygen Delivery Method: Circle system utilized Preoxygenation: Pre-oxygenation with 100% oxygen Induction Type: IV induction, Rapid sequence and Cricoid Pressure applied Ventilation: Mask ventilation without difficulty Laryngoscope Size: Mac and 3 Grade View: Grade II Tube type: Oral Tube size: 7.0 mm Number of attempts: 1 Airway Equipment and Method: Stylet Placement Confirmation: ETT inserted through vocal cords under direct vision, positive ETCO2 and breath sounds checked- equal and bilateral Secured at: 21 cm Tube secured with: Tape Dental Injury: Teeth and Oropharynx as per pre-operative assessment
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
## 2024-03-27 NOTE — Telephone Encounter (Signed)
-----   Message from [facility] sent at 03/27/2024 12:57 PM EDT ----- Regarding: Followup Delorese Sellin, This patient will need a KUB follow-up in 2 weeks with repeat LFTs.  He You may place it under my name. She will be in the hospital for at least the next few days. Thanks. GM ----- Message ----- From: Wilhelmenia Aloha Raddle., MD Sent: 03/27/2024  12:58 PM EDT To: Odetta LITTIE Curly, RN Subject: Followup

## 2024-03-27 NOTE — Anesthesia Postprocedure Evaluation (Signed)
 Anesthesia Post Note  Patient: Micole Delehanty  Procedure(s) Performed: ERCP, WITH LITHROTRIPSY OR REMOVAL OF COMMON BILE DUCT CALCULUS USING BASKET     Patient location during evaluation: Endoscopy Anesthesia Type: General Level of consciousness: awake and alert Pain management: pain level controlled Vital Signs Assessment: post-procedure vital signs reviewed and stable Respiratory status: spontaneous breathing, nonlabored ventilation, respiratory function stable and patient connected to nasal cannula oxygen Cardiovascular status: blood pressure returned to baseline and stable Postop Assessment: no apparent nausea or vomiting Anesthetic complications: no   No notable events documented.  Last Vitals:  Vitals:   03/27/24 1320 03/27/24 1348  BP: 128/77 120/68  Pulse: 96 89  Resp: 20 16  Temp:  36.4 C  SpO2: 98% 98%    Last Pain:  Vitals:   03/27/24 1348  TempSrc: Oral  PainSc:                  Garnette DELENA Gab

## 2024-03-28 ENCOUNTER — Inpatient Hospital Stay (HOSPITAL_COMMUNITY)

## 2024-03-28 DIAGNOSIS — K851 Biliary acute pancreatitis without necrosis or infection: Secondary | ICD-10-CM | POA: Diagnosis not present

## 2024-03-28 DIAGNOSIS — K6389 Other specified diseases of intestine: Secondary | ICD-10-CM | POA: Diagnosis not present

## 2024-03-28 DIAGNOSIS — R7989 Other specified abnormal findings of blood chemistry: Secondary | ICD-10-CM | POA: Diagnosis not present

## 2024-03-28 DIAGNOSIS — Z9049 Acquired absence of other specified parts of digestive tract: Secondary | ICD-10-CM | POA: Diagnosis not present

## 2024-03-28 DIAGNOSIS — K8050 Calculus of bile duct without cholangitis or cholecystitis without obstruction: Secondary | ICD-10-CM | POA: Diagnosis not present

## 2024-03-28 DIAGNOSIS — N200 Calculus of kidney: Secondary | ICD-10-CM | POA: Diagnosis not present

## 2024-03-28 DIAGNOSIS — R109 Unspecified abdominal pain: Secondary | ICD-10-CM | POA: Diagnosis not present

## 2024-03-28 LAB — HEPATIC FUNCTION PANEL
ALT: 158 U/L — ABNORMAL HIGH (ref 0–44)
AST: 74 U/L — ABNORMAL HIGH (ref 15–41)
Albumin: 3.2 g/dL — ABNORMAL LOW (ref 3.5–5.0)
Alkaline Phosphatase: 203 U/L — ABNORMAL HIGH (ref 38–126)
Bilirubin, Direct: 0.6 mg/dL — ABNORMAL HIGH (ref 0.0–0.2)
Indirect Bilirubin: 0.8 mg/dL (ref 0.3–0.9)
Total Bilirubin: 1.4 mg/dL — ABNORMAL HIGH (ref 0.0–1.2)
Total Protein: 6.6 g/dL (ref 6.5–8.1)

## 2024-03-28 LAB — CBC
HCT: 38.5 % (ref 36.0–46.0)
Hemoglobin: 12.2 g/dL (ref 12.0–15.0)
MCH: 28.4 pg (ref 26.0–34.0)
MCHC: 31.7 g/dL (ref 30.0–36.0)
MCV: 89.7 fL (ref 80.0–100.0)
Platelets: 374 K/uL (ref 150–400)
RBC: 4.29 MIL/uL (ref 3.87–5.11)
RDW: 14 % (ref 11.5–15.5)
WBC: 8.8 K/uL (ref 4.0–10.5)
nRBC: 0 % (ref 0.0–0.2)

## 2024-03-28 LAB — BASIC METABOLIC PANEL WITH GFR
Anion gap: 8 (ref 5–15)
BUN: <5 mg/dL — ABNORMAL LOW (ref 6–20)
CO2: 22 mmol/L (ref 22–32)
Calcium: 8.7 mg/dL — ABNORMAL LOW (ref 8.9–10.3)
Chloride: 105 mmol/L (ref 98–111)
Creatinine, Ser: 0.61 mg/dL (ref 0.44–1.00)
GFR, Estimated: >60 mL/min (ref >60)
Glucose, Bld: 106 mg/dL — ABNORMAL HIGH (ref 70–99)
Potassium: 3.3 mmol/L — ABNORMAL LOW (ref 3.5–5.1)
Sodium: 135 mmol/L (ref 135–145)

## 2024-03-28 LAB — LIPASE, BLOOD: Lipase: 106 U/L — ABNORMAL HIGH (ref 11–51)

## 2024-03-28 MED ORDER — POTASSIUM CHLORIDE IN NACL 20-0.9 MEQ/L-% IV SOLN
INTRAVENOUS | Status: AC
  Filled 2024-03-28 (×2): qty 1000

## 2024-03-28 MED ORDER — DOCUSATE SODIUM 50 MG PO CAPS
50.0000 mg | ORAL_CAPSULE | Freq: Every day | ORAL | Status: DC | PRN
  Administered 2024-03-28 – 2024-03-29 (×2): 50 mg via ORAL
  Filled 2024-03-28 (×3): qty 1

## 2024-03-28 MED ORDER — IOHEXOL 300 MG/ML  SOLN
100.0000 mL | Freq: Once | INTRAMUSCULAR | Status: AC | PRN
  Administered 2024-03-28: 100 mL via INTRAVENOUS

## 2024-03-28 MED ORDER — CLONAZEPAM 0.5 MG PO TABS: 0.5000 mg | ORAL_TABLET | Freq: Two times a day (BID) | ORAL | Status: DC | PRN

## 2024-03-28 NOTE — Plan of Care (Signed)

## 2024-03-28 NOTE — Progress Notes (Signed)
 Progress Note  1 Day Post-Op  Subjective: Patient provides history and is anxious throughout encounter. She reports increased generalized abdominal pain that is more prominent of the epigastric and right upper quadrant. She states that the pain is still present even after taking pain medication. Patient reports nausea before and after her procedure. She ate 3 [place] nuggets yesterday and experienced nausea. Denies vomiting. She feels that she tolerates liquids well. Denies BM.  ROS  All negative with the exception of above.  Objective: Vital signs in last 24 hours: Temp:  [97.6 F (36.4 C)-98.6 F (37 C)] 98.5 F (36.9 C) (07/24 0531) Pulse Rate:  [82-104] 85 (07/24 0531) Resp:  [9-20] 18 (07/24 0531) BP: (110-128)/(46-77) 111/68 (07/24 0531) SpO2:  [96 %-100 %] 99 % (07/24 0531)    Intake/Output from previous day: 07/23 0701 - 07/24 0700 In: 2175.1 [P.O.:720; I.V.:1255.1; IV Piggyback:200] Out: 10 [Blood:10] Intake/Output this shift: Total I/O In: 120 [P.O.:120] Out: -   PE: General: Anxious, WD, female who is laying in bed in NAD. Lungs: Respiratory effort nonlabored Abd: Soft and ND. Diffuse tenderness to palpation that is more prominent in the epigastric and right upper quadrant. Psych: A&Ox3 with an appropriate affect.    Lab Results:  Recent Labs    03/26/24 1734 03/28/24 0452  WBC 8.5 8.8  HGB 13.7 12.2  HCT 41.5 38.5  PLT 390 374   BMET Recent Labs    03/27/24 0512 03/28/24 0452  NA 138 135  K 3.6 3.3*  CL 109 105  CO2 21* 22  GLUCOSE 82 106*  BUN 6 <5*  CREATININE 0.44 0.61  CALCIUM 8.6* 8.7*   PT/INR No results for input(s): LABPROT, INR in the last 72 hours. CMP     Component Value Date/Time   NA 135 03/28/2024 0452   NA 135 04/11/2019 1119   K 3.3 (L) 03/28/2024 0452   CL 105 03/28/2024 0452   CO2 22 03/28/2024 0452   GLUCOSE 106 (H) 03/28/2024 0452   BUN <5 (L) 03/28/2024 0452   BUN 13 04/11/2019 1119   CREATININE 0.61  03/28/2024 0452   CREATININE 0.56 12/25/2015 1116   CALCIUM 8.7 (L) 03/28/2024 0452   PROT 6.6 03/28/2024 0452   PROT 6.8 04/11/2019 1119   ALBUMIN 3.2 (L) 03/28/2024 0452   ALBUMIN 4.5 04/11/2019 1119   AST 74 (H) 03/28/2024 0452   ALT 158 (H) 03/28/2024 0452   ALKPHOS 203 (H) 03/28/2024 0452   BILITOT 1.4 (H) 03/28/2024 0452   BILITOT 0.3 04/11/2019 1119   GFRNONAA >60 03/28/2024 0452   GFRAA >60 06/01/2020 1353   Lipase     Component Value Date/Time   LIPASE 106 (H) 03/28/2024 0452       Studies/Results: DG C-Arm 1-60 Min-No Report Result Date: 03/27/2024 Fluoroscopy was utilized by the requesting physician.  No radiographic interpretation.   DG C-Arm 1-60 Min-No Report Result Date: 03/27/2024 Fluoroscopy was utilized by the requesting physician.  No radiographic interpretation.   MR ABDOMEN MRCP W WO CONTAST Result Date: 03/27/2024 CLINICAL DATA:  Status post laparoscopic cholecystectomy 03/22/2024. Now with jaundice. Common bile duct stone suspected. EXAM: MRI ABDOMEN WITHOUT AND WITH CONTRAST (INCLUDING MRCP) TECHNIQUE: Multiplanar multisequence MR imaging of the abdomen was performed both before and after the administration of intravenous contrast. Heavily T2-weighted images of the biliary and pancreatic ducts were obtained, and three-dimensional MRCP images were rendered by post processing. CONTRAST:  6mL GADAVIST  GADOBUTROL  1 MMOL/ML IV SOLN COMPARISON:  CT scan  12/18/2023. FINDINGS: Lower chest: Trace symmetric pleural effusions. Hepatobiliary: No suspicious focal abnormality within the liver parenchyma. 5 mm T2 hyperintensity without enhancement in the lateral segment left liver is compatible with a tiny cyst. Mild intrahepatic biliary duct dilatation evident. 3.5 x 2.0 x 4.4 cm fluid collection is identified in the gallbladder fossa showing subtle peripheral enhancement. MRCP imaging confirms intra and extrahepatic biliary duct dilatation. Common duct measures on the order  of 11 mm diameter proximal to the confluence with the cystic duct. Common bile duct measures 9 mm diameter proximally and 10 mm diameter just proximal to the ampulla. There are multiple tiny layering distal common bile duct stones along the dependent wall well demonstrated on axial T2 image 20 of series 4, coronal T2 haste image 18 of series 3, and coronal MRCP image 49 of series 7. Pancreas: No focal mass lesion. No dilatation of the main duct. No intraparenchymal cyst. No peripancreatic edema. Spleen:  No splenomegaly. No suspicious focal mass lesion. Adrenals/Urinary Tract: No adrenal nodule or mass. Kidneys unremarkable. Stomach/Bowel: Stomach is unremarkable. No gastric wall thickening. No evidence of outlet obstruction. Duodenum is normally positioned as is the ligament of Treitz. No small bowel or colonic dilatation within the visualized abdomen. Vascular/Lymphatic: No abdominal aortic aneurysm. No abdominal lymphadenopathy. Other: Trace free fluid is seen at the inferior tip of the right liver. Musculoskeletal: No focal suspicious marrow enhancement within the visualized bony anatomy. IMPRESSION: 1. MRCP imaging confirms intra and extrahepatic biliary duct dilatation with multiple dependent tiny layering distal common bile duct stones. 2. 3.5 x 2.0 x 4.4 cm fluid collection in the gallbladder fossa showing subtle peripheral enhancement. Imaging features are nonspecific and could be postoperative seroma or hematoma. Biloma also a consideration. Evolving abscess at postoperative day 5 not excluded. Electronically Signed   By: Camellia Candle M.D.   On: 03/27/2024 07:50   MR 3D Recon At Scanner Result Date: 03/27/2024 CLINICAL DATA:  Status post laparoscopic cholecystectomy 03/22/2024. Now with jaundice. Common bile duct stone suspected. EXAM: MRI ABDOMEN WITHOUT AND WITH CONTRAST (INCLUDING MRCP) TECHNIQUE: Multiplanar multisequence MR imaging of the abdomen was performed both before and after the  administration of intravenous contrast. Heavily T2-weighted images of the biliary and pancreatic ducts were obtained, and three-dimensional MRCP images were rendered by post processing. CONTRAST:  6mL GADAVIST  GADOBUTROL  1 MMOL/ML IV SOLN COMPARISON:  CT scan 12/18/2023. FINDINGS: Lower chest: Trace symmetric pleural effusions. Hepatobiliary: No suspicious focal abnormality within the liver parenchyma. 5 mm T2 hyperintensity without enhancement in the lateral segment left liver is compatible with a tiny cyst. Mild intrahepatic biliary duct dilatation evident. 3.5 x 2.0 x 4.4 cm fluid collection is identified in the gallbladder fossa showing subtle peripheral enhancement. MRCP imaging confirms intra and extrahepatic biliary duct dilatation. Common duct measures on the order of 11 mm diameter proximal to the confluence with the cystic duct. Common bile duct measures 9 mm diameter proximally and 10 mm diameter just proximal to the ampulla. There are multiple tiny layering distal common bile duct stones along the dependent wall well demonstrated on axial T2 image 20 of series 4, coronal T2 haste image 18 of series 3, and coronal MRCP image 49 of series 7. Pancreas: No focal mass lesion. No dilatation of the main duct. No intraparenchymal cyst. No peripancreatic edema. Spleen:  No splenomegaly. No suspicious focal mass lesion. Adrenals/Urinary Tract: No adrenal nodule or mass. Kidneys unremarkable. Stomach/Bowel: Stomach is unremarkable. No gastric wall thickening. No evidence of outlet obstruction. Duodenum  is normally positioned as is the ligament of Treitz. No small bowel or colonic dilatation within the visualized abdomen. Vascular/Lymphatic: No abdominal aortic aneurysm. No abdominal lymphadenopathy. Other: Trace free fluid is seen at the inferior tip of the right liver. Musculoskeletal: No focal suspicious marrow enhancement within the visualized bony anatomy. IMPRESSION: 1. MRCP imaging confirms intra and  extrahepatic biliary duct dilatation with multiple dependent tiny layering distal common bile duct stones. 2. 3.5 x 2.0 x 4.4 cm fluid collection in the gallbladder fossa showing subtle peripheral enhancement. Imaging features are nonspecific and could be postoperative seroma or hematoma. Biloma also a consideration. Evolving abscess at postoperative day 5 not excluded. Electronically Signed   By: Camellia Candle M.D.   On: 03/27/2024 07:50    Anti-infectives: Anti-infectives (From admission, onward)    None        Assessment/Plan Choledocholithiasis Elevated lipase Post-operative fluid collection -S/p lap chole 7/18 by Dr. Lyndel  -Returned to the hospital 7/22 w/ epigastric pain, nausea, and jaundice, found to have choledocholithiasis, elevated lipase, and small fluid collection in GB fossa.  -MRCP 7/23 w/ multiple CBD stones, biliary dilation, 3 x 2 x 4 cm fluid collection  -ERCP 7/23 Dr. Wilhelmenia w/ successful removal of stones, placement temporary pancreatic stent; no extrav seen on cholangiogram from cystic duct.  -WBC 8.8 -LFTs: Albumin 3.2; AST 74; ALT 158; Alkaline Phosphatase 203; Tbili 1.4; Direct Bili 0.6 -Potassium 3.3 -Patient did not tolerate food intake well. Accordingly, advise CLD. -IV fluid orders in place -Due to increased pain, discussed with GI obtaining CT A/P. Agreed for CT to be completed today. -To assist with anxiety, added clonazepam  as this was an at home medication.   FEN: CLD, 0.9% NaCl with Kcl 20 mEq/L infusion at 75 mL/hr VTE: SCDs ID: None    LOS: 2 days     Marjorie Carlyon Favre, [facility] 03/28/2024, 9:54 AM Please see Amion for pager number during day hours 7:00am-4:30pm

## 2024-03-28 NOTE — Progress Notes (Signed)
 [facility] Progress Note  CC: Choledocholithiasis, elevated LFTs and lipase level s/p cholecystectomy 03/22/2024   Subjective: Patient stated she felt like a new woman yesterday after ERCP completed, no significant abdominal pain. She developed mild nausea yesterday evening after eating a few chicken nuggets. She had recurrence of epigastric and RUQ pain around 9 PM last night which persisted overnight and acutely worsened around 5:30 AM despite receiving Vicodin and Dilaudid .  She was seen by general surgery PA and a CTAP was ordered.  Nausea currently controlled. Her upper abdominal pain has de-escalated. She is passing gas per the rectum, no BM x 5 days. No chest pain or shortness of breath. RN at the bedside.   Objective:   ERCP by Dr. Wilhelmenia 03/27/2024: - Z-line irregular, 38 cm from the incisors.  - 2 cm hiatal hernia.  - Erythematous mucosa in the stomach. Biopsied for HP evaluation.  - No gross lesions in the duodenal bulb, in the first portion of the duodenum and in the second portion of the duodenum.  - The major papilla appeared normal but was laterally displaced requiring long position for better evaluation.  - Double wire technique required for biliary cannulation.  - Multiple filling defects consistent with stones and sludge were seen on the cholangiogram.  - There is a shelflike deformity in the distal common bile duct just proximal to the biliary orifice. Suspect this occurred as a result of stone/debris distally.  - The entire main bile duct was moderately dilated.  - Choledocholithiasis was found. Partial removal was accomplished by biliary sphincterotomy and balloon sweep initially. Subsequently balloon sphincteroplasty performed. Remaining stones were extracted.  - No evidence of contrast extravasation in the area of the cystic duct or in the bile duct itself including the area of distal shelflike deformity.  - One temporary plastic pancreatic stent  was placed into the ventral pancreatic duct to decrease PEP  Vital signs in last 24 hours: Temp:  [97.6 F (36.4 C)-98.7 F (37.1 C)] 98.7 F (37.1 C) (07/24 1100) Pulse Rate:  [85-104] 86 (07/24 1100) Resp:  [9-20] 18 (07/24 1100) BP: (110-128)/(46-77) 114/74 (07/24 1100) SpO2:  [97 %-100 %] 99 % (07/24 1100)   General: 43 year old female in no acute distress. Eyes: No significant scleral icterus. Heart: Regular rate and rhythm, no murmurs. Pulm: Breath sounds clear throughout. Abdomen: Soft, mild distention. Tenderness to the epigastric and RUQ without rebound or guarding. Hypoactive bowel sounds to all 4 quadrants. Laparoscopic scars closed and intact without erythema or exudate. Extremities:  No edema. Neurologic:  Alert and oriented x 4. Grossly normal neurologically. Psych:  Alert and cooperative. Normal mood and affect.  Intake/Output from previous day: 07/23 0701 - 07/24 0700 In: 2175.1 [P.O.:720; I.V.:1255.1; IV Piggyback:200] Out: 10 [Blood:10] Intake/Output this shift: Total I/O In: 120 [P.O.:120] Out: -   Lab Results: Recent Labs    03/26/24 1734 03/28/24 0452  WBC 8.5 8.8  HGB 13.7 12.2  HCT 41.5 38.5  PLT 390 374   BMET Recent Labs    03/26/24 1734 03/27/24 0512 03/28/24 0452  NA 134* 138 135  K 3.2* 3.6 3.3*  CL 102 109 105  CO2 20* 21* 22  GLUCOSE 121* 82 106*  BUN 7 6 <5*  CREATININE 0.40* 0.44 0.61  CALCIUM 8.9 8.6* 8.7*   LFT Recent Labs    03/28/24 0452  PROT 6.6  ALBUMIN 3.2*  AST 74*  ALT 158*  ALKPHOS 203*  BILITOT 1.4*  BILIDIR 0.6*  IBILI 0.8   PT/INR No results for input(s): LABPROT, INR in the last 72 hours. Hepatitis Panel No results for input(s): HEPBSAG, HCVAB, HEPAIGM, HEPBIGM in the last 72 hours.  CT ABDOMEN PELVIS W CONTRAST Result Date: 03/28/2024 CLINICAL DATA:  Postop abdominal pain. EXAM: CT ABDOMEN AND PELVIS WITH CONTRAST TECHNIQUE: Multidetector CT imaging of the abdomen and pelvis was  performed using the standard protocol following bolus administration of intravenous contrast. RADIATION DOSE REDUCTION: This exam was performed according to the departmental dose-optimization program which includes automated exposure control, adjustment of the mA and/or kV according to patient size and/or use of iterative reconstruction technique. CONTRAST:  OMNIPAQUE  IOHEXOL  300 MG/ML  SOLN COMPARISON:  MRI 03/27/2024. FINDINGS: Lower chest: No acute findings. Hepatobiliary: No suspicious focal abnormality within the liver parenchyma. Tiny hypodensity in the lateral segment left liver (16/2) is too small to characterize but is statistically most likely benign. No followup imaging is recommended. Small area of low attenuation in the anterior liver, adjacent to the falciform ligament, is in a characteristic location for focal fatty deposition. Gallbladder surgically absent. Stranding noted in the gallbladder fossa with 2.6 x 1.9 cm fluid collection evident on image 31/2. This measures slightly smaller than on previous MRI where it was measured at 3.5 x 2.0 cm. There is no organized or enhancing rim associated with this collection today to suggest abscess. No gas within the fluid collection. No fluid along the liver capsule. No intrahepatic biliary duct dilatation. Distal common bile duct diameter is 8 mm, decreased from 10 mm on the previous MRI. Pancreas: Pancreatic duct stent is evident. No main duct dilatation in the pancreas. No pancreatic or peripancreatic edema. Spleen: No splenomegaly. No suspicious focal mass lesion. Adrenals/Urinary Tract: No adrenal nodule or mass. 3 mm nonobstructing stone identified lower pole right kidney. Left kidney unremarkable. No evidence for hydroureter. The urinary bladder appears normal for the degree of distention. Stomach/Bowel: Stomach is mildly distended with food and fluid. Duodenum is normally positioned as is the ligament of Treitz. No small bowel wall thickening. No  small bowel dilatation. The terminal ileum is normal. The appendix is not well visualized, but there is no edema or inflammation in the region of the cecal tip to suggest appendicitis. No gross colonic mass. No colonic wall thickening. Mild gaseous distension of non dependent segments of the transverse colon. Vascular/Lymphatic: No abdominal aortic aneurysm. No abdominal aortic atherosclerotic calcification. Portal vein and superior mesenteric vein are patent. There is no gastrohepatic or hepatoduodenal ligament lymphadenopathy. No retroperitoneal or mesenteric lymphadenopathy. No pelvic sidewall lymphadenopathy. Reproductive: IUD is visualized in the uterus. There is no adnexal mass. Other: Small volume low-density free fluid is noted in the cul-de-sac. Musculoskeletal: No worrisome lytic or sclerotic osseous abnormality. IMPRESSION: 1. Status post cholecystectomy. Stranding in the gallbladder fossa with 2.6 x 1.9 cm fluid collection. This measures slightly smaller than on previous MRI where it was measured at 3.5 x 2.0 cm. There is no organized or enhancing rim associated with this collection today to suggest abscess in the fluid may simply represent small postoperative residual. No gas within the fluid collection. No fluid is seen along the liver capsule. 2. Pancreatic duct stent in place. No main duct dilatation in the pancreas. 3. 3 mm nonobstructing stone lower pole right kidney. 4. Small volume low-density free fluid in the cul-de-sac, likely physiologic. Electronically Signed   By: Camellia Candle M.D.   On: 03/28/2024 11:02   DG C-Arm 1-60 Min-No Report Result Date: 03/27/2024  Fluoroscopy was utilized by the requesting physician.  No radiographic interpretation.   DG C-Arm 1-60 Min-No Report Result Date: 03/27/2024 Fluoroscopy was utilized by the requesting physician.  No radiographic interpretation.   MR ABDOMEN MRCP W WO CONTAST Result Date: 03/27/2024 CLINICAL DATA:  Status post laparoscopic  cholecystectomy 03/22/2024. Now with jaundice. Common bile duct stone suspected. EXAM: MRI ABDOMEN WITHOUT AND WITH CONTRAST (INCLUDING MRCP) TECHNIQUE: Multiplanar multisequence MR imaging of the abdomen was performed both before and after the administration of intravenous contrast. Heavily T2-weighted images of the biliary and pancreatic ducts were obtained, and three-dimensional MRCP images were rendered by post processing. CONTRAST:  6mL GADAVIST  GADOBUTROL  1 MMOL/ML IV SOLN COMPARISON:  CT scan 12/18/2023. FINDINGS: Lower chest: Trace symmetric pleural effusions. Hepatobiliary: No suspicious focal abnormality within the liver parenchyma. 5 mm T2 hyperintensity without enhancement in the lateral segment left liver is compatible with a tiny cyst. Mild intrahepatic biliary duct dilatation evident. 3.5 x 2.0 x 4.4 cm fluid collection is identified in the gallbladder fossa showing subtle peripheral enhancement. MRCP imaging confirms intra and extrahepatic biliary duct dilatation. Common duct measures on the order of 11 mm diameter proximal to the confluence with the cystic duct. Common bile duct measures 9 mm diameter proximally and 10 mm diameter just proximal to the ampulla. There are multiple tiny layering distal common bile duct stones along the dependent wall well demonstrated on axial T2 image 20 of series 4, coronal T2 haste image 18 of series 3, and coronal MRCP image 49 of series 7. Pancreas: No focal mass lesion. No dilatation of the main duct. No intraparenchymal cyst. No peripancreatic edema. Spleen:  No splenomegaly. No suspicious focal mass lesion. Adrenals/Urinary Tract: No adrenal nodule or mass. Kidneys unremarkable. Stomach/Bowel: Stomach is unremarkable. No gastric wall thickening. No evidence of outlet obstruction. Duodenum is normally positioned as is the ligament of Treitz. No small bowel or colonic dilatation within the visualized abdomen. Vascular/Lymphatic: No abdominal aortic aneurysm. No  abdominal lymphadenopathy. Other: Trace free fluid is seen at the inferior tip of the right liver. Musculoskeletal: No focal suspicious marrow enhancement within the visualized bony anatomy. IMPRESSION: 1. MRCP imaging confirms intra and extrahepatic biliary duct dilatation with multiple dependent tiny layering distal common bile duct stones. 2. 3.5 x 2.0 x 4.4 cm fluid collection in the gallbladder fossa showing subtle peripheral enhancement. Imaging features are nonspecific and could be postoperative seroma or hematoma. Biloma also a consideration. Evolving abscess at postoperative day 5 not excluded. Electronically Signed   By: Camellia Candle M.D.   On: 03/27/2024 07:50   MR 3D Recon At Scanner Result Date: 03/27/2024 CLINICAL DATA:  Status post laparoscopic cholecystectomy 03/22/2024. Now with jaundice. Common bile duct stone suspected. EXAM: MRI ABDOMEN WITHOUT AND WITH CONTRAST (INCLUDING MRCP) TECHNIQUE: Multiplanar multisequence MR imaging of the abdomen was performed both before and after the administration of intravenous contrast. Heavily T2-weighted images of the biliary and pancreatic ducts were obtained, and three-dimensional MRCP images were rendered by post processing. CONTRAST:  6mL GADAVIST  GADOBUTROL  1 MMOL/ML IV SOLN COMPARISON:  CT scan 12/18/2023. FINDINGS: Lower chest: Trace symmetric pleural effusions. Hepatobiliary: No suspicious focal abnormality within the liver parenchyma. 5 mm T2 hyperintensity without enhancement in the lateral segment left liver is compatible with a tiny cyst. Mild intrahepatic biliary duct dilatation evident. 3.5 x 2.0 x 4.4 cm fluid collection is identified in the gallbladder fossa showing subtle peripheral enhancement. MRCP imaging confirms intra and extrahepatic biliary duct dilatation. Common duct measures on  the order of 11 mm diameter proximal to the confluence with the cystic duct. Common bile duct measures 9 mm diameter proximally and 10 mm diameter just  proximal to the ampulla. There are multiple tiny layering distal common bile duct stones along the dependent wall well demonstrated on axial T2 image 20 of series 4, coronal T2 haste image 18 of series 3, and coronal MRCP image 49 of series 7. Pancreas: No focal mass lesion. No dilatation of the main duct. No intraparenchymal cyst. No peripancreatic edema. Spleen:  No splenomegaly. No suspicious focal mass lesion. Adrenals/Urinary Tract: No adrenal nodule or mass. Kidneys unremarkable. Stomach/Bowel: Stomach is unremarkable. No gastric wall thickening. No evidence of outlet obstruction. Duodenum is normally positioned as is the ligament of Treitz. No small bowel or colonic dilatation within the visualized abdomen. Vascular/Lymphatic: No abdominal aortic aneurysm. No abdominal lymphadenopathy. Other: Trace free fluid is seen at the inferior tip of the right liver. Musculoskeletal: No focal suspicious marrow enhancement within the visualized bony anatomy. IMPRESSION: 1. MRCP imaging confirms intra and extrahepatic biliary duct dilatation with multiple dependent tiny layering distal common bile duct stones. 2. 3.5 x 2.0 x 4.4 cm fluid collection in the gallbladder fossa showing subtle peripheral enhancement. Imaging features are nonspecific and could be postoperative seroma or hematoma. Biloma also a consideration. Evolving abscess at postoperative day 5 not excluded. Electronically Signed   By: Camellia Candle M.D.   On: 03/27/2024 07:50    Patient  Profile: Heather [name] is a 43 y.o. female with a past medical history of anxiety, depression, ADHD, obesity, migraine headaches, vitamin B12 deficiency, vitamin D  deficiency, GERD and gallstones s/p laparoscopic cholecystectomy by Dr. Deward Foy 03/22/2024. Readmitted 03/26/2024 with recurrent N/V and upper abdominal pain with new onset jaundice.   Assessment / Plan:  43 year old female with history of gallstones previously admitted 03/22/2024 with N/V,  epigastric and RUQ pain. RUQ sonogram showed cholelithiasis with evidence of acute cholecystitis without biliary ductal dilatation. S/P laparoscopic cholecystectomy 03/22/2024, IOC not performed due to abnormal biliary anatomy. Discharged home 7/19. Readmitted 7/22 with recurrent N/V, upper abdominal pain with new onset jaundice concerning for biliary obstruction. Elevated LFTs and lipase. Total bili 6.7 -> 3.3. Alk phos 250 -> 214. AST 224 -> 147. ALT 248 -> 198. Lipase 974 -> 84.  Afebrile. Abdominal MRI/MRCP identified intra/extrahepatic biliary ductal dilatation with choledocholithiasis and a 3.5 x 2.0 4.4 cm fluid collection in the gallbladder fossa suggestive of postoperative seroma, hematoma or biloma and no evidence of pancreatitis. ERCP 7/23 identified choledocholithiasis with partial removal per biliary sphincterotomy and balloon sweep followed by balloon sphincteroplasty and remaining stones were extracted, no evidence of a biliary leak, there was a deformity in the distal CBD and 1 temporary plastic pancreatic stent was placed in the ventral pancreatic duct to reduce the risk of PEEP. Her epigastric/RUQ pain significantly improved post procedure then progressively worsened overnight with acute pain early this morning. Repeat CTAP showed post cholecystectomy physiology with a 2.6 x 1.9 cm fluid collection in the gallbladder fossa which has decreased from prior imaging without abscess, PD stent was in position, no pancreatic/peripancreatic edema and no evidence of a perforation.  LFTs drifting downward.  Lipase 106.  Hemodynamically stable.  Afebrile. - Clear liquid diet  - IV fluids and pain management per the hospitalist - Continue Pantoprazole  40 mg IV daily - Hold Lovenox /VTE prophylaxis for 48 hours post ERCP - CBC, CMP and lipase level in a.m. - Await path results - Patient  will require a KUB as an outpatient in 10 to 14 days to ensure pancreatic stent has migrated successfully, if still  present at that time we will need to schedule EGD with stent pull - Await further recommendations per Dr. Stacia   History of GERD, on Pantoprazole  20 mg daily at home.  Patient endorses having intermittent black hematemesis x 3 days prior to admission. Past chronic BC powder use.  A 2 cm hiatal hernia and evidence of gastritis identified during ERCP 03/27/2024. - Await gastric biopsy results, rule out H. pylori - Pantoprazole  40 mg IV daily   Principal Problem:   Elevated liver function tests Active Problems:   Gastritis without bleeding   Choledocholithiasis   Acute biliary pancreatitis     LOS: 2 days   Elida CHRISTELLA Shawl  03/28/2024, 11:51 AM

## 2024-03-29 DIAGNOSIS — K851 Biliary acute pancreatitis without necrosis or infection: Secondary | ICD-10-CM | POA: Diagnosis not present

## 2024-03-29 DIAGNOSIS — K8050 Calculus of bile duct without cholangitis or cholecystitis without obstruction: Secondary | ICD-10-CM | POA: Diagnosis not present

## 2024-03-29 DIAGNOSIS — R7989 Other specified abnormal findings of blood chemistry: Secondary | ICD-10-CM | POA: Diagnosis not present

## 2024-03-29 LAB — CBC
HCT: 38.1 % (ref 36.0–46.0)
Hemoglobin: 11.8 g/dL — ABNORMAL LOW (ref 12.0–15.0)
MCH: 28.6 pg (ref 26.0–34.0)
MCHC: 31 g/dL (ref 30.0–36.0)
MCV: 92.3 fL (ref 80.0–100.0)
Platelets: 362 K/uL (ref 150–400)
RBC: 4.13 MIL/uL (ref 3.87–5.11)
RDW: 14.6 % (ref 11.5–15.5)
WBC: 8.7 K/uL (ref 4.0–10.5)
nRBC: 0 % (ref 0.0–0.2)

## 2024-03-29 LAB — BASIC METABOLIC PANEL WITH GFR
Anion gap: 8 (ref 5–15)
BUN: 5 mg/dL — ABNORMAL LOW (ref 6–20)
CO2: 20 mmol/L — ABNORMAL LOW (ref 22–32)
Calcium: 8.4 mg/dL — ABNORMAL LOW (ref 8.9–10.3)
Chloride: 106 mmol/L (ref 98–111)
Creatinine, Ser: 0.55 mg/dL (ref 0.44–1.00)
GFR, Estimated: >60 mL/min (ref >60)
Glucose, Bld: 92 mg/dL (ref 70–99)
Potassium: 3.1 mmol/L — ABNORMAL LOW (ref 3.5–5.1)
Sodium: 134 mmol/L — ABNORMAL LOW (ref 135–145)

## 2024-03-29 LAB — HEPATIC FUNCTION PANEL
ALT: 140 U/L — ABNORMAL HIGH (ref 0–44)
AST: 77 U/L — ABNORMAL HIGH (ref 15–41)
Albumin: 3.2 g/dL — ABNORMAL LOW (ref 3.5–5.0)
Alkaline Phosphatase: 190 U/L — ABNORMAL HIGH (ref 38–126)
Bilirubin, Direct: 0.5 mg/dL — ABNORMAL HIGH (ref 0.0–0.2)
Indirect Bilirubin: 0.9 mg/dL (ref 0.3–0.9)
Total Bilirubin: 1.4 mg/dL — ABNORMAL HIGH (ref 0.0–1.2)
Total Protein: 6.3 g/dL — ABNORMAL LOW (ref 6.5–8.1)

## 2024-03-29 LAB — LIPASE, BLOOD: Lipase: 101 U/L — ABNORMAL HIGH (ref 11–51)

## 2024-03-29 LAB — SURGICAL PATHOLOGY

## 2024-03-29 MED ORDER — ONDANSETRON 4 MG PO TBDP
4.0000 mg | ORAL_TABLET | Freq: Four times a day (QID) | ORAL | 0 refills | Status: DC | PRN
Start: 2024-03-29 — End: 2024-03-29

## 2024-03-29 MED ORDER — POTASSIUM CHLORIDE CRYS ER 20 MEQ PO TBCR
40.0000 meq | EXTENDED_RELEASE_TABLET | Freq: Once | ORAL | Status: AC
  Administered 2024-03-29: 40 meq via ORAL
  Filled 2024-03-29: qty 2

## 2024-03-29 MED ORDER — ACETAMINOPHEN 325 MG PO TABS: 650.0000 mg | ORAL_TABLET | Freq: Four times a day (QID) | ORAL | Status: DC | PRN

## 2024-03-29 MED ORDER — ONDANSETRON HCL 4 MG PO TABS: 4.0000 mg | ORAL_TABLET | Freq: Three times a day (TID) | ORAL | 1 refills | Status: DC | PRN

## 2024-03-29 MED ORDER — DOCUSATE SODIUM 50 MG PO CAPS: 50.0000 mg | ORAL_CAPSULE | Freq: Every day | ORAL | 0 refills | Status: AC | PRN

## 2024-03-29 NOTE — Discharge Instructions (Signed)

## 2024-03-29 NOTE — Plan of Care (Signed)

## 2024-03-29 NOTE — Progress Notes (Signed)
 [facility] Progress Note  CC:  Choledocholithiasis, elevated LFTs and lipase level s/p cholecystectomy 03/22/2024   Subjective: She endorses feeling much better.  Tolerating a soft diet.  No nausea or vomiting.  Epigastric and RUQ pain significantly improved.  No BM x 6 days.  She is passing gas per the rectum.   Objective:   ERCP by Dr. Wilhelmenia 03/27/2024: - Z-line irregular, 38 cm from the incisors.  - 2 cm hiatal hernia.  - Erythematous mucosa in the stomach. Biopsied for HP evaluation.  - No gross lesions in the duodenal bulb, in the first portion of the duodenum and in the second portion of the duodenum.  - The major papilla appeared normal but was laterally displaced requiring long position for better evaluation.  - Double wire technique required for biliary cannulation.  - Multiple filling defects consistent with stones and sludge were seen on the cholangiogram.  - There is a shelflike deformity in the distal common bile duct just proximal to the biliary orifice. Suspect this occurred as a result of stone/debris distally.  - The entire main bile duct was moderately dilated.  - Choledocholithiasis was found. Partial removal was accomplished by biliary sphincterotomy and balloon sweep initially. Subsequently balloon sphincteroplasty performed. Remaining stones were extracted.  - No evidence of contrast extravasation in the area of the cystic duct or in the bile duct itself including the area of distal shelflike deformity.  - One temporary plastic pancreatic stent was placed into the ventral pancreatic duct to decrease PEP   Vital signs in last 24 hours: Temp:  [98 F (36.7 C)-98.7 F (37.1 C)] 98.2 F (36.8 C) (07/25 0605) Pulse Rate:  [73-86] 74 (07/25 0605) Resp:  [17-18] 17 (07/25 0605) BP: (111-118)/(59-78) 111/78 (07/25 0605) SpO2:  [97 %-100 %] 99 % (07/25 9394)   General: Alert 43 year old female in no acute distress. Heart: Regular rate and  rhythm, no murmurs. Pulm: Breath sounds clear throughout. Abdomen: Nondistended.  Mild epigastric and RUQ tenderness without rebound or guarding.  Positive bowel sounds to all 4 quadrants. Extremities:  Without edema. Neurologic:  Alert and  oriented x 4. Grossly normal neurologically. Psych:  Alert and cooperative. Normal mood and affect.  Intake/Output from previous day: 07/24 0701 - 07/25 0700 In: 1942.5 [P.O.:480; I.V.:1462.5] Out: 400 [Urine:400] Intake/Output this shift: Total I/O In: 240 [P.O.:240] Out: 300 [Urine:300]  Lab Results: Recent Labs    03/26/24 1734 03/28/24 0452 03/29/24 0423  WBC 8.5 8.8 8.7  HGB 13.7 12.2 11.8*  HCT 41.5 38.5 38.1  PLT 390 374 362   BMET Recent Labs    03/27/24 0512 03/28/24 0452 03/29/24 0423  NA 138 135 134*  K 3.6 3.3* 3.1*  CL 109 105 106  CO2 21* 22 20*  GLUCOSE 82 106* 92  BUN 6 <5* 5*  CREATININE 0.44 0.61 0.55  CALCIUM 8.6* 8.7* 8.4*   LFT Recent Labs    03/29/24 0423  PROT 6.3*  ALBUMIN 3.2*  AST 77*  ALT 140*  ALKPHOS 190*  BILITOT 1.4*  BILIDIR 0.5*  IBILI 0.9   PT/INR No results for input(s): LABPROT, INR in the last 72 hours. Hepatitis Panel No results for input(s): HEPBSAG, HCVAB, HEPAIGM, HEPBIGM in the last 72 hours.  CT ABDOMEN PELVIS W CONTRAST Result Date: 03/28/2024 CLINICAL DATA:  Postop abdominal pain. EXAM: CT ABDOMEN AND PELVIS WITH CONTRAST TECHNIQUE: Multidetector CT imaging of the abdomen and pelvis was performed using the standard protocol following bolus administration  of intravenous contrast. RADIATION DOSE REDUCTION: This exam was performed according to the departmental dose-optimization program which includes automated exposure control, adjustment of the mA and/or kV according to patient size and/or use of iterative reconstruction technique. CONTRAST:  OMNIPAQUE  IOHEXOL  300 MG/ML  SOLN COMPARISON:  MRI 03/27/2024. FINDINGS: Lower chest: No acute findings.  Hepatobiliary: No suspicious focal abnormality within the liver parenchyma. Tiny hypodensity in the lateral segment left liver (16/2) is too small to characterize but is statistically most likely benign. No followup imaging is recommended. Small area of low attenuation in the anterior liver, adjacent to the falciform ligament, is in a characteristic location for focal fatty deposition. Gallbladder surgically absent. Stranding noted in the gallbladder fossa with 2.6 x 1.9 cm fluid collection evident on image 31/2. This measures slightly smaller than on previous MRI where it was measured at 3.5 x 2.0 cm. There is no organized or enhancing rim associated with this collection today to suggest abscess. No gas within the fluid collection. No fluid along the liver capsule. No intrahepatic biliary duct dilatation. Distal common bile duct diameter is 8 mm, decreased from 10 mm on the previous MRI. Pancreas: Pancreatic duct stent is evident. No main duct dilatation in the pancreas. No pancreatic or peripancreatic edema. Spleen: No splenomegaly. No suspicious focal mass lesion. Adrenals/Urinary Tract: No adrenal nodule or mass. 3 mm nonobstructing stone identified lower pole right kidney. Left kidney unremarkable. No evidence for hydroureter. The urinary bladder appears normal for the degree of distention. Stomach/Bowel: Stomach is mildly distended with food and fluid. Duodenum is normally positioned as is the ligament of Treitz. No small bowel wall thickening. No small bowel dilatation. The terminal ileum is normal. The appendix is not well visualized, but there is no edema or inflammation in the region of the cecal tip to suggest appendicitis. No gross colonic mass. No colonic wall thickening. Mild gaseous distension of non dependent segments of the transverse colon. Vascular/Lymphatic: No abdominal aortic aneurysm. No abdominal aortic atherosclerotic calcification. Portal vein and superior mesenteric vein are patent. There  is no gastrohepatic or hepatoduodenal ligament lymphadenopathy. No retroperitoneal or mesenteric lymphadenopathy. No pelvic sidewall lymphadenopathy. Reproductive: IUD is visualized in the uterus. There is no adnexal mass. Other: Small volume low-density free fluid is noted in the cul-de-sac. Musculoskeletal: No worrisome lytic or sclerotic osseous abnormality. IMPRESSION: 1. Status post cholecystectomy. Stranding in the gallbladder fossa with 2.6 x 1.9 cm fluid collection. This measures slightly smaller than on previous MRI where it was measured at 3.5 x 2.0 cm. There is no organized or enhancing rim associated with this collection today to suggest abscess in the fluid may simply represent small postoperative residual. No gas within the fluid collection. No fluid is seen along the liver capsule. 2. Pancreatic duct stent in place. No main duct dilatation in the pancreas. 3. 3 mm nonobstructing stone lower pole right kidney. 4. Small volume low-density free fluid in the cul-de-sac, likely physiologic. Electronically Signed   By: Camellia Candle M.D.   On: 03/28/2024 11:02   DG C-Arm 1-60 Min-No Report Result Date: 03/27/2024 Fluoroscopy was utilized by the requesting physician.  No radiographic interpretation.   DG C-Arm 1-60 Min-No Report Result Date: 03/27/2024 Fluoroscopy was utilized by the requesting physician.  No radiographic interpretation.    Patient  Profile: Heather [name] is a 42 y.o. female with a past medical history of anxiety, depression, ADHD, obesity, migraine headaches, vitamin B12 deficiency, vitamin D  deficiency, GERD and gallstones s/p laparoscopic cholecystectomy by  Dr. Deward Foy 03/22/2024. Readmitted 03/26/2024 with recurrent N/V and upper abdominal pain with new onset jaundice.   Assessment / Plan:  43 year old female with a history of gallstones previously admitted 03/22/2024 with N/V, epigastric and RUQ pain. RUQ sonogram showed cholelithiasis with evidence of acute  cholecystitis without biliary ductal dilatation. S/P laparoscopic cholecystectomy 03/22/2024, IOC not performed due to abnormal biliary anatomy. Discharged home 7/19. Readmitted 7/22 with recurrent N/V, upper abdominal pain with new onset jaundice concerning for biliary obstruction. Afebrile. Abdominal MRI/MRCP identified intra/extrahepatic biliary ductal dilatation with choledocholithiasis and a 3.5 x 2.0 4.4 cm fluid collection in the gallbladder fossa suggestive of postoperative seroma, hematoma or biloma and no evidence of pancreatitis. ERCP 7/23 identified choledocholithiasis with partial removal per biliary sphincterotomy and balloon sweep followed by balloon sphincteroplasty and remaining stones were extracted, no evidence of a biliary leak, there was a deformity in the distal CBD and 1 temporary plastic pancreatic stent was placed in the ventral pancreatic duct to reduce the risk of PEP. Her epigastric/RUQ pain significantly improved post procedure then progressively worsened overnight into early morning 7/24. Repeat CTAP showed post cholecystectomy physiology with a 2.6 x 1.9 cm fluid collection in the gallbladder fossa which has decreased from prior imaging without abscess, PD stent was in position, no pancreatic/peripancreatic edema and no evidence of a perforation. Abdominal pain significantly improved today. T. Bili 1.4.  Alk phos 190.  AST 77.  ALT 140.  Lipase 101.  WBC 8.7. Hemodynamically stable.  Afebrile. - Okay to discharge home from GI standpoint - Our office will contact patient with EGD path results - Our office will contact patient to schedule a KUB as an outpatient in 10 to 14 days to ensure pancreatic stent has migrated successfully, if still present at that time we will need to schedule EGD with stent pull.    History of GERD, on Pantoprazole  20 mg daily at home.  Patient endorses having intermittent black hematemesis x 3 days prior to admission. Past chronic BC powder use.  A 2 cm  hiatal hernia and evidence of gastritis identified during ERCP 03/27/2024. - Discharge home on Pantoprazole  40 mg daily - Await EGD path results      Principal Problem:   Elevated liver function tests Active Problems:   Gastritis without bleeding   Choledocholithiasis   Acute biliary pancreatitis     LOS: 3 days   Elida CHRISTELLA Shawl  03/29/2024, 10:54 AM

## 2024-03-29 NOTE — Progress Notes (Signed)
 Reviewed written d/c instructions w pt, all questions answered, she verbalized understanding. D/C via w/c w all belongings in stable condition.
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
## 2024-03-30 ENCOUNTER — Encounter (HOSPITAL_COMMUNITY): Payer: Self-pay | Admitting: Gastroenterology

## 2024-04-02 ENCOUNTER — Ambulatory Visit: Payer: Self-pay | Admitting: Gastroenterology

## 2024-04-02 NOTE — Telephone Encounter (Signed)
 Patient calling to discuss plan of care . Please advise.

## 2024-04-02 NOTE — Telephone Encounter (Signed)
 The patient has been notified of this information to have labs and xray and all questions answered.

## 2024-04-12 ENCOUNTER — Other Ambulatory Visit

## 2024-04-12 ENCOUNTER — Ambulatory Visit
Admission: RE | Admit: 2024-04-12 | Discharge: 2024-04-12 | Disposition: A | Discharge status: Home / Self Care | Source: Ambulatory Visit | Attending: Gastroenterology | Admitting: Gastroenterology

## 2024-04-12 DIAGNOSIS — Z9689 Presence of other specified functional implants: Secondary | ICD-10-CM | POA: Diagnosis not present

## 2024-04-12 DIAGNOSIS — K8020 Calculus of gallbladder without cholecystitis without obstruction: Secondary | ICD-10-CM

## 2024-04-12 DIAGNOSIS — K839 Disease of biliary tract, unspecified: Secondary | ICD-10-CM

## 2024-04-12 LAB — HEPATIC FUNCTION PANEL
ALT: 15 U/L (ref 0–35)
AST: 13 U/L (ref 0–37)
Albumin: 4.3 g/dL (ref 3.5–5.2)
Alkaline Phosphatase: 88 U/L (ref 39–117)
Bilirubin, Direct: 0.2 mg/dL (ref 0.0–0.3)
Total Bilirubin: 0.6 mg/dL (ref 0.2–1.2)
Total Protein: 6.8 g/dL (ref 6.0–8.3)

## 2024-04-14 ENCOUNTER — Ambulatory Visit: Payer: Self-pay | Admitting: Gastroenterology

## 2024-04-15 ENCOUNTER — Other Ambulatory Visit: Payer: Self-pay

## 2024-04-15 DIAGNOSIS — T85528A Displacement of other gastrointestinal prosthetic devices, implants and grafts, initial encounter: Secondary | ICD-10-CM

## 2024-04-15 NOTE — Progress Notes (Signed)
 The patient has been notified of this information and all questions answered.  She will come in for x ray in about a week to 10 days.  She will begin miralax  as instructed

## 2024-04-15 NOTE — Progress Notes (Signed)
 No answer no voice mail

## 2024-04-18 ENCOUNTER — Ambulatory Visit
Admission: RE | Admit: 2024-04-18 | Discharge: 2024-04-18 | Disposition: A | Discharge status: Home / Self Care | Source: Ambulatory Visit | Attending: Nurse Practitioner | Admitting: Nurse Practitioner

## 2024-04-18 ENCOUNTER — Ambulatory Visit (INDEPENDENT_AMBULATORY_CARE_PROVIDER_SITE_OTHER): Payer: Medicaid Other | Admitting: Nurse Practitioner

## 2024-04-18 VITALS — BP 98/60 | HR 78 | Temp 98.1°F | Ht 60.0 in | Wt 137.8 lb

## 2024-04-18 DIAGNOSIS — H5702 Anisocoria: Secondary | ICD-10-CM

## 2024-04-18 DIAGNOSIS — S0990XA Unspecified injury of head, initial encounter: Secondary | ICD-10-CM | POA: Diagnosis not present

## 2024-04-18 DIAGNOSIS — R519 Headache, unspecified: Secondary | ICD-10-CM | POA: Diagnosis not present

## 2024-04-18 DIAGNOSIS — F909 Attention-deficit hyperactivity disorder, unspecified type: Secondary | ICD-10-CM | POA: Diagnosis not present

## 2024-04-18 DIAGNOSIS — E663 Overweight: Secondary | ICD-10-CM | POA: Diagnosis not present

## 2024-04-18 MED ORDER — AMPHETAMINE-DEXTROAMPHETAMINE 20 MG PO TABS: 20.0000 mg | ORAL_TABLET | Freq: Two times a day (BID) | ORAL | 0 refills | Status: DC

## 2024-04-18 NOTE — Assessment & Plan Note (Signed)
 New assessment.  Pending stat CT scan of head to rule out hemorrhage

## 2024-04-18 NOTE — Assessment & Plan Note (Signed)
 Patient hit head on windowsill having recurrent headache that is somewhat responsive to over-the-counter analgesics.  Patient having anisocoria today.  Stat CT scan to rule out brain injury/hemorrhage

## 2024-04-18 NOTE — Progress Notes (Signed)
 Established Patient Office Visit  Subjective   Patient ID: Heather [name], female    DOB: [birth year]  Age: 43 y.o. MRN: [id number]  Chief Complaint  Patient presents with   Follow-up    Pt complains of ongoing headache for a week. States of soreness.     HPI  ADHD: Patient currently maintained on Adderall 20 mg twice daily. States that she is still being distracted. States that the medication has been on back order   Obesity: Patient currently maintained on Wegovy  2.4 mg once a week. Patient was started on Wegovy  back in October 24 for patient's beginning weight was 164 pounds and BMI of 30.11.  [name] most recent weight was 144 pounds see here today for follow-up  States that she has not had trouble with the medication. She did not take the medication when she was in the hospital. She has been back on the medication  Eating twice a day with snacks. She is drinking coke and water . States that she will eat lunch and dinner mainly  Walking at work. She will get on average of 6-8K a day and up to 10 K with weather   Headache: states that she hit her head on a window sill on Friday. States that she did not LOC. Statse that she had to sit down afterwards. States that she has been having headaches. Statse that they are every day and in the front. Dull pain. States that she has tried tylneol that has helped. Statse that she has tried goodys a cuple of times that has helped    Review of Systems  Constitutional:  Negative for chills and fever.  Eyes:  Negative for blurred vision.  Gastrointestinal:  Negative for abdominal pain, constipation, diarrhea, nausea and vomiting.       BM daily   Musculoskeletal:  Positive for neck pain.  Neurological:  Positive for headaches. Negative for dizziness, tingling and weakness.      Objective:     BP 98/60   Pulse 78   Temp 98.1 F (36.7 C) (Oral)   Ht 5' (1.524 m)   Wt 137 lb 12.8 oz (62.5 kg)   SpO2 98%   BMI 26.91 kg/m  BP  Readings from Last 3 Encounters:  04/18/24 98/60  03/29/24 111/78  03/23/24 91/73   Wt Readings from Last 3 Encounters:  04/18/24 137 lb 12.8 oz (62.5 kg)  03/26/24 138 lb 14.2 oz (63 kg)  03/22/24 138 lb 14.2 oz (63 kg)   SpO2 Readings from Last 3 Encounters:  04/18/24 98%  03/29/24 99%  03/23/24 100%      Physical Exam Vitals and nursing note reviewed.  Constitutional:      Appearance: Normal appearance.  HENT:     Mouth/Throat:     Mouth: Mucous membranes are moist.     Pharynx: Oropharynx is clear.  Eyes:     Extraocular Movements: Extraocular movements intact.     Pupils: Pupils are unequal.     Comments: Anisocoria Right Pupil larger than left. Both reactive   Cardiovascular:     Rate and Rhythm: Normal rate and regular rhythm.     Heart sounds: Normal heart sounds.  Pulmonary:     Effort: Pulmonary effort is normal.     Breath sounds: Normal breath sounds.  Neurological:     Mental Status: She is alert.     Cranial Nerves: Cranial nerves 2-12 are intact.     Sensory: Sensation is intact.  Motor: Motor function is intact.     Gait: Gait is intact.     Deep Tendon Reflexes:     Reflex Scores:      Bicep reflexes are 2+ on the right side and 2+ on the left side.      Patellar reflexes are 2+ on the right side and 2+ on the left side.    Comments: Bilateral upper and lower extremity strength 5/5      No results found for any visits on 04/18/24.    The ASCVD Risk score (Arnett DK, et al., 2019) failed to calculate for the following reasons:   Cannot find a previous HDL lab   Cannot find a previous total cholesterol lab    Assessment & Plan:   Problem List Items Addressed This Visit       Other   ADHD (attention deficit hyperactivity disorder)   Relevant Medications   amphetamine -dextroamphetamine  (ADDERALL) 20 MG tablet   amphetamine -dextroamphetamine  (ADDERALL) 20 MG tablet   amphetamine -dextroamphetamine  (ADDERALL) 20 MG tablet   Head  injury - Primary   Patient hit head on windowsill having recurrent headache that is somewhat responsive to over-the-counter analgesics.  Patient having anisocoria today.  Stat CT scan to rule out brain injury/hemorrhage      Relevant Orders   CT HEAD WO CONTRAST ( )   Overweight (BMI 25.0-29.9)   Patient currently maintained on Wegovy  2.4 mg weekly.  Tolerating medication well.  Patient able from 6-10,000 steps daily with exercise.  Patient is still losing weight.  [name] original weight was 164 pounds.  [name] weight today was 137 patient has nearly lost 30 pounds.  Continue working on healthy lifestyle modifications      Anisocoria   New assessment.  Pending stat CT scan of head to rule out hemorrhage      Relevant Orders   CT HEAD WO CONTRAST ( )    Return in about 3 months (around 07/19/2024) for wegovy /ADHD.    Adina Crandall, NP

## 2024-04-18 NOTE — Assessment & Plan Note (Signed)
 Patient currently maintained on Wegovy  2.4 mg weekly.  Tolerating medication well.  Patient able from 6-10,000 steps daily with exercise.  Patient is still losing weight.  [name] original weight was 164 pounds.  [name] weight today was 137 patient has nearly lost 30 pounds.  Continue working on healthy lifestyle modifications

## 2024-04-19 ENCOUNTER — Ambulatory Visit: Payer: Self-pay | Admitting: Nurse Practitioner

## 2024-04-23 ENCOUNTER — Other Ambulatory Visit: Payer: Self-pay | Admitting: Nurse Practitioner

## 2024-04-23 DIAGNOSIS — F411 Generalized anxiety disorder: Secondary | ICD-10-CM

## 2024-04-24 ENCOUNTER — Other Ambulatory Visit: Payer: Self-pay | Admitting: Family

## 2024-04-24 MED ORDER — IBUPROFEN 800 MG PO TABS: 800.0000 mg | ORAL_TABLET | Freq: Three times a day (TID) | ORAL | 0 refills | Status: AC | PRN

## 2024-04-30 ENCOUNTER — Other Ambulatory Visit: Payer: Self-pay | Admitting: Family

## 2024-04-30 DIAGNOSIS — R519 Headache, unspecified: Secondary | ICD-10-CM

## 2024-05-14 ENCOUNTER — Encounter: Payer: Self-pay | Admitting: Family Medicine

## 2024-05-14 ENCOUNTER — Ambulatory Visit: Admitting: Family Medicine

## 2024-05-14 NOTE — Progress Notes (Signed)
 Patient did not keep appointment today. She may call to reschedule.

## 2024-05-15 ENCOUNTER — Encounter: Payer: Self-pay | Admitting: Neurology

## 2024-05-15 ENCOUNTER — Ambulatory Visit (INDEPENDENT_AMBULATORY_CARE_PROVIDER_SITE_OTHER): Admitting: Neurology

## 2024-05-15 VITALS — BP 109/70 | HR 87 | Ht 61.0 in | Wt 138.0 lb

## 2024-05-15 DIAGNOSIS — R413 Other amnesia: Secondary | ICD-10-CM | POA: Diagnosis not present

## 2024-05-15 DIAGNOSIS — R5383 Other fatigue: Secondary | ICD-10-CM | POA: Diagnosis not present

## 2024-05-15 DIAGNOSIS — E539 Vitamin B deficiency, unspecified: Secondary | ICD-10-CM

## 2024-05-15 DIAGNOSIS — R200 Anesthesia of skin: Secondary | ICD-10-CM

## 2024-05-15 DIAGNOSIS — E559 Vitamin D deficiency, unspecified: Secondary | ICD-10-CM

## 2024-05-15 DIAGNOSIS — R252 Cramp and spasm: Secondary | ICD-10-CM | POA: Diagnosis not present

## 2024-05-15 NOTE — Patient Instructions (Signed)
 Concussion, Adult  A concussion is a brain injury from a hard, direct hit (trauma) to the head or body. This direct hit causes the brain to shake quickly back and forth inside the skull. This can damage brain cells and cause chemical changes in the brain. A concussion may also be known as a mild traumatic brain injury (TBI). The effects of a concussion can be serious. If you have a concussion, you should be very careful to avoid having a second concussion. What are the causes? This condition is caused by: A direct hit to your head. Sudden movement of your body that causes your brain to move back and forth inside the skull, such as in a car crash. What are the signs or symptoms? The signs of a concussion can be hard to notice. Early on, they may be missed by you, family members, and health care providers. You may look fine on the outside but may act or feel differently. Every head injury is different. Symptoms are usually temporary but may last for days, weeks, or even months. Some symptoms appear right away, but other symptoms may not show up for hours or days. Physical symptoms Headaches. Dizziness and problems with coordination or balance. Sensitivity to light or noise. Nausea or vomiting. Tiredness (fatigue). Vision or hearing problems. Seizure. Mental and emotional symptoms Irritability or mood changes. Memory problems. Trouble concentrating, organizing, or making decisions. Changes in eating or sleeping patterns. Slowness in thinking, acting or reacting, speaking, or reading. Anxiety or depression. How is this diagnosed? This condition is diagnosed based on your symptoms and injury. You may also have tests, including: Imaging tests, such as a CT scan or an MRI. Neuropsychological tests. These measure your thinking, understanding, learning, and memory. How is this treated? Treatment for this condition includes: Stopping sports or activity if you are injured. Physical and  mental rest and careful observation, usually at home. Medicines to help with symptoms such as headaches, nausea, or difficulty sleeping. Referral to a concussion clinic or rehab center. Follow these instructions at home: Activity Limit activities that require a lot of thought or concentration, such as: Doing homework or job-related work. Watching TV. Using the computer or phone. Playing memory games and doing puzzles. Rest helps your brain heal. Make sure you: Get plenty of sleep. Most adults should get 7-9 hours of sleep each night. Rest during the day. Take naps or rest breaks when you feel tired. Avoid high-intensity exercise or physical activities that take a lot of effort. Stop any activity that worsens symptoms. Your health care provider may recommend light exercise such as walking. Do not do high-risk activities that could cause a second concussion, such as riding a bike or playing sports. Ask your health care provider when you can return to your normal activities, such as school, work, sports, and driving. Your ability to react may be slower after a brain injury. Never do these activities if you are dizzy. General instructions Take over-the-counter and prescription medicines only as told by your health care provider. Some medicines, such as blood thinners (anticoagulants) and aspirin, may increase the risk for complications, such as bleeding. Avoid taking opioid pain medicine while recovering from a concussion. Do not drink alcohol until your health care provider says you can. Drinking alcohol may slow your recovery and can put you at risk of further injury. Watch your symptoms and tell others around you to do the same. Complications sometimes occur after a concussion. Tell your work Production designer, theatre/television/film, teachers, Tax adviser, school  counselor, coach, or sports trainer about your injury, symptoms, and restrictions. See a mental health therapist if you feel anxious or depressed. Managing this  condition can be challenging. Keep all follow-up visits. Your health care provider will check on your recovery and give you a plan for returning to activities. How is this prevented? Avoiding another brain injury is very important. In rare cases, another injury can lead to permanent brain damage, brain swelling, or death. The risk of this is greatest during the first 7-10 days after a head injury. Avoid injuries by: Stopping activities that could lead to a second concussion, such as contact or recreational sports, until your health care provider says it is okay. Taking these actions once you have returned to sports or activities: Avoid plays or moves that can cause you to crash into another person. This is how most concussions occur. Follow the rules and be respectful of other players. Do not engage in violent or illegal plays. Getting regular exercise that includes strength and balance training. Wearing a properly fitting helmet during sports, biking, or other activities. Helmets can help protect you from serious skull and brain injuries, but they may not protect you from a concussion. Even when wearing a helmet, you should avoid being hit in the head. Where to find more information Centers for Disease Control and Prevention: [employer]no Contact a health care provider if: Your symptoms do not improve or get worse. You have new symptoms. You have another injury. Your coordination gets worse. You have unusual behavior changes. Get help right away if: You have a severe or worsening headache. You have weakness or numbness in any part of your body, slurred speech, vision changes, or confusion. You vomit repeatedly. You lose consciousness, are sleepier than normal, or are difficult to wake up. You have a seizure. These symptoms may be an emergency. Get help right away. Call 911. Do not wait to see if the symptoms will go away. Do not drive yourself to the hospital. Also, get help right away  if: You have thoughts of hurting yourself or others. Take one of these steps if you feel like you may hurt yourself or others, or have thoughts about taking your own life: Go to your nearest emergency room. Call 911. Call the National Suicide Prevention Lifeline at 917 341 5060 or 988. This is open 24 hours a day. Text the Crisis Text Line at 2168600709. This information is not intended to replace advice given to you by your health care provider. Make sure you discuss any questions you have with your health care provider. Document Revised: 12/21/2023 Document Reviewed: 01/14/2022 [employer]vier Patient Education  2025 [employer].
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
## 2024-05-16 ENCOUNTER — Encounter: Payer: Self-pay | Admitting: Neurology

## 2024-05-23 ENCOUNTER — Other Ambulatory Visit: Payer: Self-pay | Admitting: Family

## 2024-05-23 DIAGNOSIS — F411 Generalized anxiety disorder: Secondary | ICD-10-CM

## 2024-05-26 LAB — VITAMIN B6: Vitamin B6: 3.1 ug/L — ABNORMAL LOW (ref 3.4–65.2)

## 2024-05-26 LAB — VITAMIN B1: Thiamine: 97.1 nmol/L (ref 66.5–200.0)

## 2024-05-26 LAB — CBC WITH DIFFERENTIAL/PLATELET
Basophils Absolute: 0 x10E3/uL (ref 0.0–0.2)
Basos: 1 %
EOS (ABSOLUTE): 0.1 x10E3/uL (ref 0.0–0.4)
Eos: 1 %
Hematocrit: 45.5 % (ref 34.0–46.6)
Hemoglobin: 14.3 g/dL (ref 11.1–15.9)
Immature Grans (Abs): 0 x10E3/uL (ref 0.0–0.1)
Immature Granulocytes: 0 %
Lymphocytes Absolute: 2.9 x10E3/uL (ref 0.7–3.1)
Lymphs: 38 %
MCH: 28.9 pg (ref 26.6–33.0)
MCHC: 31.4 g/dL — ABNORMAL LOW (ref 31.5–35.7)
MCV: 92 fL (ref 79–97)
Monocytes Absolute: 0.6 x10E3/uL (ref 0.1–0.9)
Monocytes: 8 %
Neutrophils Absolute: 4 x10E3/uL (ref 1.4–7.0)
Neutrophils: 52 %
Platelets: 407 x10E3/uL (ref 150–450)
RBC: 4.94 x10E6/uL (ref 3.77–5.28)
RDW: 12.8 % (ref 11.7–15.4)
WBC: 7.6 x10E3/uL (ref 3.4–10.8)

## 2024-05-26 LAB — B12 AND FOLATE PANEL
Folate: 2.2 ng/mL — ABNORMAL LOW (ref >3.0)
Vitamin B-12: 275 pg/mL (ref 232–1245)

## 2024-05-26 LAB — COMPREHENSIVE METABOLIC PANEL WITH GFR
ALT: 19 IU/L (ref 0–32)
AST: 21 IU/L (ref 0–40)
Albumin: 4.6 g/dL (ref 3.9–4.9)
Alkaline Phosphatase: 84 IU/L (ref 44–121)
BUN/Creatinine Ratio: 13 (ref 9–23)
BUN: 8 mg/dL (ref 6–24)
Bilirubin Total: 0.7 mg/dL (ref 0.0–1.2)
CO2: 20 mmol/L (ref 20–29)
Calcium: 9.2 mg/dL (ref 8.7–10.2)
Chloride: 103 mmol/L (ref 96–106)
Creatinine, Ser: 0.64 mg/dL (ref 0.57–1.00)
Globulin, Total: 2.6 g/dL (ref 1.5–4.5)
Glucose: 77 mg/dL (ref 70–99)
Potassium: 4.2 mmol/L (ref 3.5–5.2)
Sodium: 136 mmol/L (ref 134–144)
Total Protein: 7.2 g/dL (ref 6.0–8.5)
eGFR: 114 mL/min/1.73 (ref >59)

## 2024-05-26 LAB — METHYLMALONIC ACID, SERUM: Methylmalonic Acid: 88 nmol/L (ref 0–378)

## 2024-05-26 LAB — MAGNESIUM: Magnesium: 2 mg/dL (ref 1.6–2.3)

## 2024-05-26 LAB — VITAMIN D 25 HYDROXY (VIT D DEFICIENCY, FRACTURES): Vit D, 25-Hydroxy: 35.5 ng/mL (ref 30.0–100.0)

## 2024-05-26 LAB — TSH RFX ON ABNORMAL TO FREE T4: TSH: 2.04 u[IU]/mL (ref 0.450–4.500)

## 2024-05-27 MED ORDER — CLONAZEPAM 0.5 MG PO TABS: 0.5000 mg | ORAL_TABLET | Freq: Every day | ORAL | 0 refills | Status: DC | PRN

## 2024-05-27 NOTE — Addendum Note (Signed)
 Addended by: WENDEE LYNWOOD HERO on: 05/27/2024 04:05 PM   Modules accepted: Orders

## 2024-06-03 MED ORDER — WEGOVY 2.4 MG/0.75ML ~~LOC~~ SOAJ: 2.4000 mg | SUBCUTANEOUS | 0 refills | Status: DC

## 2024-06-03 NOTE — Addendum Note (Signed)
 Addended by: WENDEE LYNWOOD HERO on: 06/03/2024 01:59 PM   Modules accepted: Orders

## 2024-06-10 ENCOUNTER — Ambulatory Visit: Payer: Self-pay | Admitting: *Deleted

## 2024-06-11 ENCOUNTER — Telehealth: Payer: Self-pay

## 2024-06-11 ENCOUNTER — Other Ambulatory Visit (HOSPITAL_COMMUNITY): Payer: Self-pay

## 2024-06-11 NOTE — Telephone Encounter (Signed)
 Iezzi, Shereka A -  MED[place]  Pharmacy Patient Advocate Encounter   Received notification from Onbase that prior authorization for Wegovy  2.4 is required/requested.   Insurance verification completed.   The patient is insured through [facility].   Per test claim: Effective October 1st, Medicaid will discontinue coverage of GLP1 medications for weight loss (such as Wegovy  and Zepbound), unless the patient has a documented history of a heart attack or stroke. Zepbound will continue to be covered only for patients with moderate to severe sleep apnea (AHI 15-30) and a BMI greater than 40. Because of this change, the prior authorization team will not be submitting new PA requests for GLP1 medications prescribed for weight loss, as patients will be unable to continue therapy under Medicaid coverage.

## 2024-06-12 ENCOUNTER — Telehealth: Payer: Self-pay

## 2024-06-12 ENCOUNTER — Other Ambulatory Visit (HOSPITAL_COMMUNITY): Payer: Self-pay

## 2024-06-12 NOTE — Telephone Encounter (Signed)
 Copied from CRM [id number]. Topic: Clinical - Medication Question >> Jun 11, 2024 12:49 PM Ahlexyia S wrote: Reason for CRM: Robin from [facility] pharmacy called in wanting to know if pt PCP can contact pt insurance to complete an appeal for coverage on medication semaglutide -weight management (WEGOVY ) 2.4 MG/0.75ML SOAJ SQ injection. Grayce stated that there was previously a prior auth put in and it was approved but there was a change with all medicaid plans for any type of weight loss medications. Grayce is requesting a callback when this has been completed.  Callback info: Grayce; [phone] hit [phone]

## 2024-06-12 NOTE — Telephone Encounter (Signed)
 Called and spoke with patient.  Pt states that her pharmacist requests for PCP to put in an appeal stating that the GLP1 has been working and with reason to keep taking the medication.  Pt states that the pharmacist will call the office to explain that with an appeal GLP1 for weight loss has been getting approved.  Please advise.

## 2024-06-12 NOTE — Telephone Encounter (Signed)
 Can send to the appeal departments

## 2024-06-12 NOTE — Telephone Encounter (Signed)
 See other result note. Sent to appeals department

## 2024-06-13 ENCOUNTER — Telehealth: Payer: Self-pay

## 2024-06-13 ENCOUNTER — Other Ambulatory Visit (HOSPITAL_COMMUNITY): Payer: Self-pay

## 2024-06-13 NOTE — Telephone Encounter (Signed)

## 2024-06-17 NOTE — Telephone Encounter (Signed)
 Can we let the patient know what the appeals department has said

## 2024-06-22 ENCOUNTER — Other Ambulatory Visit: Payer: Self-pay | Admitting: Nurse Practitioner

## 2024-06-22 DIAGNOSIS — F411 Generalized anxiety disorder: Secondary | ICD-10-CM

## 2024-06-24 MED ORDER — CLONAZEPAM 0.5 MG PO TABS: 0.5000 mg | ORAL_TABLET | Freq: Every day | ORAL | 0 refills | Status: DC | PRN

## 2024-06-24 NOTE — Addendum Note (Signed)
 Addended by: WENDEE LYNWOOD HERO on: 06/24/2024 01:13 PM   Modules accepted: Orders

## 2024-06-28 ENCOUNTER — Telehealth: Payer: Self-pay

## 2024-06-28 NOTE — Telephone Encounter (Signed)
 Heather [name]

## 2024-06-28 NOTE — Telephone Encounter (Signed)
 Error

## 2024-07-22 ENCOUNTER — Encounter: Payer: Self-pay | Admitting: Nurse Practitioner

## 2024-07-22 ENCOUNTER — Other Ambulatory Visit: Payer: Self-pay | Admitting: Nurse Practitioner

## 2024-07-22 DIAGNOSIS — F411 Generalized anxiety disorder: Secondary | ICD-10-CM

## 2024-07-22 DIAGNOSIS — F909 Attention-deficit hyperactivity disorder, unspecified type: Secondary | ICD-10-CM

## 2024-07-23 MED ORDER — AMPHETAMINE-DEXTROAMPHETAMINE 20 MG PO TABS: 20.0000 mg | ORAL_TABLET | Freq: Two times a day (BID) | ORAL | 0 refills | Status: DC

## 2024-08-05 ENCOUNTER — Ambulatory Visit: Admitting: Nurse Practitioner

## 2024-08-20 ENCOUNTER — Ambulatory Visit: Admitting: Nurse Practitioner

## 2024-08-20 ENCOUNTER — Encounter: Payer: Self-pay | Admitting: Nurse Practitioner

## 2024-08-20 VITALS — BP 102/60 | HR 79 | Temp 98.1°F | Ht 61.85 in | Wt 143.8 lb

## 2024-08-20 DIAGNOSIS — Z862 Personal history of diseases of the blood and blood-forming organs and certain disorders involving the immune mechanism: Secondary | ICD-10-CM

## 2024-08-20 DIAGNOSIS — F909 Attention-deficit hyperactivity disorder, unspecified type: Secondary | ICD-10-CM

## 2024-08-20 DIAGNOSIS — E538 Deficiency of other specified B group vitamins: Secondary | ICD-10-CM | POA: Diagnosis not present

## 2024-08-20 DIAGNOSIS — E663 Overweight: Secondary | ICD-10-CM

## 2024-08-20 DIAGNOSIS — F411 Generalized anxiety disorder: Secondary | ICD-10-CM

## 2024-08-20 DIAGNOSIS — Z131 Encounter for screening for diabetes mellitus: Secondary | ICD-10-CM | POA: Diagnosis not present

## 2024-08-20 DIAGNOSIS — Z0000 Encounter for general adult medical examination without abnormal findings: Secondary | ICD-10-CM

## 2024-08-20 DIAGNOSIS — K219 Gastro-esophageal reflux disease without esophagitis: Secondary | ICD-10-CM

## 2024-08-20 DIAGNOSIS — Z13220 Encounter for screening for lipoid disorders: Secondary | ICD-10-CM

## 2024-08-20 DIAGNOSIS — Z126 Encounter for screening for malignant neoplasm of bladder: Secondary | ICD-10-CM | POA: Diagnosis not present

## 2024-08-20 DIAGNOSIS — K2900 Acute gastritis without bleeding: Secondary | ICD-10-CM

## 2024-08-20 DIAGNOSIS — E559 Vitamin D deficiency, unspecified: Secondary | ICD-10-CM

## 2024-08-20 LAB — TSH: TSH: 2.14 u[IU]/mL (ref 0.35–5.50)

## 2024-08-20 LAB — COMPREHENSIVE METABOLIC PANEL WITH GFR
ALT: 10 U/L (ref 3–35)
AST: 11 U/L (ref 5–37)
Albumin: 4.4 g/dL (ref 3.5–5.2)
Alkaline Phosphatase: 68 U/L (ref 39–117)
BUN: 12 mg/dL (ref 6–23)
CO2: 25 meq/L (ref 19–32)
Calcium: 9.2 mg/dL (ref 8.4–10.5)
Chloride: 103 meq/L (ref 96–112)
Creatinine, Ser: 0.68 mg/dL (ref 0.40–1.20)
GFR: 108.2 mL/min (ref >60.00)
Glucose, Bld: 82 mg/dL (ref 70–99)
Potassium: 3.8 meq/L (ref 3.5–5.1)
Sodium: 136 meq/L (ref 135–145)
Total Bilirubin: 0.4 mg/dL (ref 0.2–1.2)
Total Protein: 6.8 g/dL (ref 6.0–8.3)

## 2024-08-20 LAB — CBC WITH DIFFERENTIAL/PLATELET
Basophils Absolute: 0.1 K/uL (ref 0.0–0.1)
Basophils Relative: 0.6 % (ref 0.0–3.0)
Eosinophils Absolute: 0.1 K/uL (ref 0.0–0.7)
Eosinophils Relative: 1.1 % (ref 0.0–5.0)
HCT: 42.9 % (ref 36.0–46.0)
Hemoglobin: 14.6 g/dL (ref 12.0–15.0)
Lymphocytes Relative: 30.7 % (ref 12.0–46.0)
Lymphs Abs: 2.7 K/uL (ref 0.7–4.0)
MCHC: 34 g/dL (ref 30.0–36.0)
MCV: 89 fl (ref 78.0–100.0)
Monocytes Absolute: 0.8 K/uL (ref 0.1–1.0)
Monocytes Relative: 8.8 % (ref 3.0–12.0)
Neutro Abs: 5.2 K/uL (ref 1.4–7.7)
Neutrophils Relative %: 58.8 % (ref 43.0–77.0)
Platelets: 357 K/uL (ref 150.0–400.0)
RBC: 4.82 Mil/uL (ref 3.87–5.11)
RDW: 14 % (ref 11.5–15.5)
WBC: 8.9 K/uL (ref 4.0–10.5)

## 2024-08-20 LAB — URINALYSIS, MICROSCOPIC ONLY

## 2024-08-20 LAB — LIPID PANEL
Cholesterol: 112 mg/dL (ref 28–200)
HDL: 41.9 mg/dL (ref >39.00)
LDL Cholesterol: 53 mg/dL (ref 10–99)
NonHDL: 69.93
Total CHOL/HDL Ratio: 3
Triglycerides: 83 mg/dL (ref 10.0–149.0)
VLDL: 16.6 mg/dL (ref 0.0–40.0)

## 2024-08-20 LAB — HEMOGLOBIN A1C: Hgb A1c MFr Bld: 4.9 % (ref 4.6–6.5)

## 2024-08-20 LAB — IBC + FERRITIN
Ferritin: 21.1 ng/mL (ref 10.0–291.0)
Iron: 64 ug/dL (ref 42–145)
Saturation Ratios: 20.6 % (ref 20.0–50.0)
TIBC: 310.8 ug/dL (ref 250.0–450.0)
Transferrin: 222 mg/dL (ref 212.0–360.0)

## 2024-08-20 MED ORDER — CLONAZEPAM 0.5 MG PO TABS: 0.5000 mg | ORAL_TABLET | Freq: Every day | ORAL | 0 refills | Status: DC | PRN

## 2024-08-20 MED ORDER — AMPHETAMINE-DEXTROAMPHETAMINE 20 MG PO TABS: 20.0000 mg | ORAL_TABLET | Freq: Two times a day (BID) | ORAL | 0 refills | Status: AC

## 2024-08-20 MED ORDER — ONDANSETRON HCL 4 MG PO TABS: 4.0000 mg | ORAL_TABLET | Freq: Three times a day (TID) | ORAL | 1 refills | Status: AC | PRN

## 2024-08-20 MED ORDER — PANTOPRAZOLE SODIUM 40 MG PO TBEC: 40.0000 mg | DELAYED_RELEASE_TABLET | Freq: Every day | ORAL | 0 refills | Status: AC

## 2024-08-20 NOTE — Assessment & Plan Note (Signed)
 Feels uncontrolled per patient report with epigastric tenderness will increase Protonix  from 20 mg to 40 mg for 90 days.

## 2024-08-20 NOTE — Assessment & Plan Note (Signed)
 Discussed age-appropriate immunizations and screening exams.  Did review patient's personal, surgical, social, family histories.  Patient is up-to-date on all age-appropriate vaccinations she would like.  Patient declined flu vaccine today.  Patient too young for CRC screening.  Up-to-date on breast cancer screening and cervical cancer screening.  Patient was given information at discharge about preventative healthcare maintenance with anticipatory guidance.

## 2024-08-20 NOTE — Assessment & Plan Note (Signed)
 Patient still with some epigastric tenderness query gastritis versus GERD versus postcholecystectomy scarring.  Will do Protonix  40 mg daily for 90 days anticipate trying to taper down to 20 mg thereafter.

## 2024-08-20 NOTE — Assessment & Plan Note (Signed)
 Check CBC and iron panels today

## 2024-08-20 NOTE — Assessment & Plan Note (Signed)
 History of the same reviewed most recent labs from neurology

## 2024-08-20 NOTE — Assessment & Plan Note (Signed)
 Patient was doing well on Wegovy  but not obtainable financially due to insurance changes.  Patient is concerned as she is gaining weight back and wanted to discuss weight loss medications.  Pending labs today

## 2024-08-20 NOTE — Progress Notes (Signed)
 Established Patient Office Visit  Subjective   Patient ID: Heather [name], female    DOB: 09/14/1980  Age: 43 y.o. MRN: [id number]  Chief Complaint  Patient presents with   Annual Exam   Medication Management    Klonopin  and adderall     HPI  ADHD: Patient currently maintained on Adderall 20 mg twice daily.  Patient is doing well with medication  Anxiety: Patient currently maintained on Klonopin  0.5 mg daily as needed. She was doing therapy from [facility] in the past she stopped doing it due to insurance.  Patient is requesting increase in Klonopin  due to recurrent stressors  Vitamin B12 deficiency: Currently maintained on cyanocobalamin  1000 mcg IM every 30 days.  Has been seen by neurology  GERD: Patient currently maintained on Protonix  20 mg daily. States that she is having break through acid. States that since her gallbaldder removed she uses it more often. States that she would use zofran  when she was on the wegovy .  for complete physical and follow up of chronic conditions.  Immunizations: -Tetanus: Completed in 2016 -Influenza:refused  -Shingles: Too young -Pneumonia: Too young -HPV: Followed by GYN  Diet: Fair diet. She is eating 1-2 eals a day and she will snack sometimes. States that off the wegovy  she is eating less to try and not gain weight. She is drinking soda Exercise: No regular exercise.  Eye exam: Completes annually.hx of astigmatism. She has glasses  Dental exam: Completes semi-annually    Colonoscopy: Too young, currently average risk Lung Cancer Screening: N/A, former smoker but too young  Pap smear: 03/01/2023 negative cells negative HPV followed by Bascom Birk, MD GYN  Mammogram: breast center in [place] 11/15/2023  Sleep: goinng to bed 1-2 in the am and will get up around 730-8 and then she is having trouble getting to work States taht her grandmother passed away in 23-Dec-2023. States that the estae is being hadndled by her sister.   Was 8-10  and getting up at 620      Review of Systems  Constitutional:  Negative for chills and fever.  Respiratory:  Negative for shortness of breath.   Cardiovascular:  Negative for chest pain and leg swelling.  Gastrointestinal:  Negative for abdominal pain, blood in stool, constipation, diarrhea, nausea and vomiting.  Genitourinary:  Negative for dysuria and hematuria.  Neurological:  Negative for tingling and headaches.  Psychiatric/Behavioral:  Negative for hallucinations and suicidal ideas.       Objective:     BP 102/60   Pulse 79   Temp 98.1 F (36.7 C) (Oral)   Ht 5' 1.85 (1.571 m)   Wt 143 lb 12.8 oz (65.2 kg)   SpO2 98%   BMI 26.43 kg/m    Physical Exam Vitals and nursing note reviewed.  Constitutional:      Appearance: Normal appearance.  HENT:     Right Ear: Tympanic membrane, ear canal and external ear normal.     Left Ear: Tympanic membrane, ear canal and external ear normal.     Mouth/Throat:     Mouth: Mucous membranes are moist.     Pharynx: Oropharynx is clear.  Eyes:     Extraocular Movements: Extraocular movements intact.     Pupils: Pupils are equal, round, and reactive to light.  Cardiovascular:     Rate and Rhythm: Normal rate and regular rhythm.     Pulses: Normal pulses.     Heart sounds: Normal heart sounds.  Pulmonary:  Effort: Pulmonary effort is normal.     Breath sounds: Normal breath sounds.  Abdominal:     General: Bowel sounds are normal. There is no distension.     Palpations: There is no mass.     Tenderness: There is generalized abdominal tenderness and tenderness in the right upper quadrant.     Hernia: No hernia is present.  Musculoskeletal:     Right lower leg: No edema.     Left lower leg: No edema.  Lymphadenopathy:     Cervical: No cervical adenopathy.  Skin:    General: Skin is warm.  Neurological:     General: No focal deficit present.     Mental Status: She is alert.     Deep Tendon Reflexes:     Reflex  Scores:      Bicep reflexes are 2+ on the right side and 2+ on the left side.      Patellar reflexes are 2+ on the right side and 2+ on the left side.    Comments: Bilateral upper and lower extremity strength 5/5  Psychiatric:        Mood and Affect: Mood normal.        Behavior: Behavior normal.        Thought Content: Thought content normal.        Judgment: Judgment normal.      No results found for any visits on 08/20/24.    The ASCVD Risk score (Arnett DK, et al., 2019) failed to calculate for the following reasons:   Cannot find a previous HDL lab   Cannot find a previous total cholesterol lab   * - Cholesterol units were assumed    Assessment & Plan:   Problem List Items Addressed This Visit       Digestive   Gastroesophageal reflux disease   Feels uncontrolled per patient report with epigastric tenderness will increase Protonix  from 20 mg to 40 mg for 90 days.      Relevant Medications   pantoprazole  (PROTONIX ) 40 MG tablet   ondansetron  (ZOFRAN ) 4 MG tablet   Gastritis without bleeding   Patient still with some epigastric tenderness query gastritis versus GERD versus postcholecystectomy scarring.  Will do Protonix  40 mg daily for 90 days anticipate trying to taper down to 20 mg thereafter.        Other   Preventative health care - Primary   Discussed age-appropriate immunizations and screening exams.  Did review patient's personal, surgical, social, family histories.  Patient is up-to-date on all age-appropriate vaccinations she would like.  Patient declined flu vaccine today.  Patient too young for CRC screening.  Up-to-date on breast cancer screening and cervical cancer screening.  Patient was given information at discharge about preventative healthcare maintenance with anticipatory guidance.      Relevant Orders   CBC with Differential/Platelet   Comprehensive metabolic panel with GFR   TSH   Generalized anxiety disorder   History of same maintained on  Klonopin  0.5 mg daily.  Refill provided today.  Ambulatory furl to psychology for talk therapy      Relevant Medications   clonazePAM  (KLONOPIN ) 0.5 MG tablet   Other Relevant Orders   TSH   Ambulatory referral to Psychology   DRUG MONITORING, PANEL 8 WITH CONFIRMATION, URINE   Vitamin B12 deficiency   History of the same reviewed most recent labs from neurology      Vitamin D  deficiency   History of the same reviewed most recent labs  from neurology      ADHD (attention deficit hyperactivity disorder)   History of the same.  Currently maintained on Adderall 20 mg twice daily.  PDMP reviewed.  UDS updated today.  Refills provided      Relevant Medications   amphetamine -dextroamphetamine  (ADDERALL) 20 MG tablet   amphetamine -dextroamphetamine  (ADDERALL) 20 MG tablet   amphetamine -dextroamphetamine  (ADDERALL) 20 MG tablet   Other Relevant Orders   TSH   DRUG MONITORING, PANEL 8 WITH CONFIRMATION, URINE   Overweight (BMI 25.0-29.9)   Patient was doing well on Wegovy  but not obtainable financially due to insurance changes.  Patient is concerned as she is gaining weight back and wanted to discuss weight loss medications.  Pending labs today      History of anemia   Check CBC and iron panels today      Relevant Orders   CBC with Differential/Platelet   IBC + Ferritin   Other Visit Diagnoses       Screening for diabetes mellitus       Relevant Orders   Hemoglobin A1c     Screening for lipid disorders       Relevant Orders   Lipid panel     Screening for bladder cancer       Relevant Orders   Urine Microscopic       Return in about 3 months (around 11/18/2024) for GAD/ADHD.    Adina Crandall, NP

## 2024-08-20 NOTE — Assessment & Plan Note (Signed)
 History of same maintained on Klonopin  0.5 mg daily.  Refill provided today.  Ambulatory furl to psychology for talk therapy

## 2024-08-20 NOTE — Assessment & Plan Note (Signed)
 History of the same.  Currently maintained on Adderall 20 mg twice daily.  PDMP reviewed.  UDS updated today.  Refills provided

## 2024-08-22 ENCOUNTER — Ambulatory Visit: Payer: Self-pay | Admitting: Nurse Practitioner

## 2024-08-22 DIAGNOSIS — R300 Dysuria: Secondary | ICD-10-CM

## 2024-08-22 LAB — DM TEMPLATE

## 2024-08-22 LAB — DRUG MONITORING, PANEL 8 WITH CONFIRMATION, URINE
6 Acetylmorphine: NEGATIVE ng/mL (ref <10)
Alcohol Metabolites: NEGATIVE ng/mL (ref <500)
Alphahydroxyalprazolam: NEGATIVE ng/mL (ref <25)
Alphahydroxymidazolam: NEGATIVE ng/mL (ref <50)
Alphahydroxytriazolam: NEGATIVE ng/mL (ref <50)
Aminoclonazepam: 254 ng/mL — ABNORMAL HIGH (ref <25)
Amphetamines: NEGATIVE ng/mL (ref <500)
Benzodiazepines: POSITIVE ng/mL — AB (ref <100)
Buprenorphine, Urine: NEGATIVE ng/mL (ref <5)
Cocaine Metabolite: NEGATIVE ng/mL (ref <150)
Creatinine: 111.6 mg/dL (ref >=20.0)
Hydroxyethylflurazepam: NEGATIVE ng/mL (ref <50)
Lorazepam: NEGATIVE ng/mL (ref <50)
MDMA: NEGATIVE ng/mL (ref <500)
Marijuana Metabolite: NEGATIVE ng/mL (ref <20)
Nordiazepam: NEGATIVE ng/mL (ref <50)
Opiates: NEGATIVE ng/mL (ref <100)
Oxazepam: NEGATIVE ng/mL (ref <50)
Oxidant: NEGATIVE ug/mL (ref <200)
Oxycodone: NEGATIVE ng/mL (ref <100)
Temazepam: NEGATIVE ng/mL (ref <50)
pH: 5.8 (ref 4.5–9.0)

## 2024-08-23 NOTE — Addendum Note (Signed)
 Addended by: WENDEE LYNWOOD HERO on: 08/23/2024 04:44 PM   Modules accepted: Orders

## 2024-08-23 NOTE — Telephone Encounter (Signed)
 Placed future orders if she wants to do a urine sample

## 2024-09-02 ENCOUNTER — Ambulatory Visit: Admitting: Nurse Practitioner

## 2024-09-04 ENCOUNTER — Encounter: Payer: Self-pay | Admitting: Nurse Practitioner

## 2024-09-04 DIAGNOSIS — F411 Generalized anxiety disorder: Secondary | ICD-10-CM

## 2024-09-04 NOTE — Telephone Encounter (Signed)
 Can we get the wegovy  reps info and see if there is an update on the oral wegovy  pill?

## 2024-09-10 NOTE — Telephone Encounter (Signed)
 Pt has been advised to come in for Xray. She will come in as able.

## 2024-09-11 NOTE — Telephone Encounter (Signed)
 Sent Matt wegovy  rep information,  Wegovy    [employer], Obesity, Sales     Contact the rep [phone] [email or web address]

## 2024-09-18 ENCOUNTER — Ambulatory Visit: Payer: Self-pay

## 2024-09-18 DIAGNOSIS — K8080 Other cholelithiasis without obstruction: Secondary | ICD-10-CM

## 2024-09-18 NOTE — Telephone Encounter (Signed)
 FYI Only or Action Required?: Action required by provider: clinical question for provider and update on patient condition.  Patient was last seen in primary care on 08/20/2024 by Heather Lynwood HERO, NP.  Called Nurse Triage reporting Abdominal Pain.  Symptoms began several weeks ago.  Interventions attempted: Nothing.  Symptoms are: gradually worsening.  Triage Disposition: See Physician Within 24 Hours  Patient/caregiver understands and will follow disposition?: No, wishes to speak with PCP     Copied from CRM #[id number]. Topic: Clinical - Red Word Triage >> Sep 18, 2024 12:03 PM Heather [name] wrote: Red Word that prompted transfer to Nurse Triage: severe pain in stomach at site where gallbladder was removed    Reason for Disposition  [1] MODERATE pain (e.g., interferes with normal activities) AND [2] pain comes and goes (cramps) AND [3] present > 24 hours  (Exception: Pain with Vomiting or Diarrhea - see that Guideline.)  Answer Assessment - Initial Assessment Questions Pt requesting imaging orders for abdominal pain r/t gallbladder removal 6 months ago. Pt reports she did f/u with surgeon at [facility] that suggested imaging but referred her back to PCP. Pt states she had gallbladder removed but 22 stones were left. Pt had second surgery for stone removal and stent placement to pancreas. Pt states since initial surgery she has pain in incision site despite being cleared by surgeon. She states pain is similar to gallbladder attacks, pain 10/10 but does ease with time to 5-6/10. Pt would like imaging to r/o any remaining stones or complications as she was told she is having pain r/t scar tissue. Pt would like f/u call from office if orders able to be placed and sent to [facility] imaging center.     1. LOCATION: Where does it hurt?      Along gallbladder incision site; pt reports gallbladder was removed 6 months ago but 22 stones were left behind. Second surgery to remove stones and stent  placed in pancreas  2. RADIATION: Does the pain shoot anywhere else? (e.g., chest, back)     No   3. ONSET: When did the pain begin? (e.g., minutes, hours or days ago)      Pt states pain comes and goes but always severe at onset. States she feels like she is having a gallbladder attack, pain severe then eases with time to 5/6  4. SUDDEN: Gradual or sudden onset?     Sudden   5. PATTERN Does the pain come and go, or is it constant?     Comes and goes   6. SEVERITY: How bad is the pain?  (e.g., Scale 1-10; mild, moderate, or severe)     At this time 5-6; at time of call prior to transfer to NT, severe   7. RECURRENT SYMPTOM: Have you ever had this type of stomach pain before? If Yes, ask: When was the last time? and What happened that time?      Yes; pt states these attacks have been present since gallbladder removed   8. CAUSE: What do you think is causing the stomach pain? (e.g., gallstones, recent abdominal surgery)     Gallstones vs. Complications from gallbladder surgery   9. RELIEVING/AGGRAVATING FACTORS: What makes it better or worse? (e.g., antacids, bending or twisting motion, bowel movement)     No   10. OTHER SYMPTOMS: Do you have any other symptoms? (e.g., back pain, diarrhea, fever, urination pain, vomiting)       No  Protocols used: Abdominal Pain - Female-A-AH

## 2024-09-18 NOTE — Telephone Encounter (Signed)
 I have placed an order for MRCP at [facility]. Please give UC and ED precautions and if she worsens she needs to be seen by me in office

## 2024-09-19 NOTE — Telephone Encounter (Signed)
 Spoke with pt. She is aware that Adina has placed this order. UC and ED precautions have been gone over with the pt.

## 2024-09-23 NOTE — Telephone Encounter (Signed)
 Can we set up a video visit for weight loss conversation

## 2024-09-24 ENCOUNTER — Other Ambulatory Visit: Payer: Self-pay | Admitting: Nurse Practitioner

## 2024-09-24 DIAGNOSIS — F411 Generalized anxiety disorder: Secondary | ICD-10-CM

## 2024-09-25 ENCOUNTER — Telehealth: Admitting: Nurse Practitioner

## 2024-09-25 DIAGNOSIS — E669 Obesity, unspecified: Secondary | ICD-10-CM | POA: Diagnosis not present

## 2024-09-25 MED ORDER — WEGOVY 1.5 MG PO TABS: 1.5000 mg | ORAL_TABLET | Freq: Every day | ORAL | 0 refills | Status: AC

## 2024-09-25 MED ORDER — CLONAZEPAM 0.5 MG PO TABS: 0.5000 mg | ORAL_TABLET | Freq: Every day | ORAL | 0 refills | Status: AC | PRN

## 2024-09-25 NOTE — Addendum Note (Signed)
 Addended by: WENDEE LYNWOOD HERO on: 09/25/2024 10:56 AM   Modules accepted: Orders

## 2024-09-25 NOTE — Progress Notes (Signed)
 " Ph: [phone] Fax: [phone]   Patient ID: Heather [name], female    DOB: Feb 10, 1981, 44 y.o.   MRN: [id number]  Virtual visit completed through MyChart, a video enabled telemedicine application. Due to national recommendations of social distancing due to COVID-19, a virtual visit is felt to be most appropriate for this patient at this time. Reviewed limitations, risks, security and privacy concerns of performing a virtual visit and the availability of in person appointments. I also reviewed that there may be a patient responsible charge related to this service. The patient agreed to proceed.   Patient location: home Provider location: [place] at [facility], office Persons participating in this virtual visit: patient, provider   If any vitals were documented, they were collected by patient at home unless specified below.    There were no vitals taken for this visit.   CC: Weight loss Subjective:   HPI: Heather [name] is a 44 y.o. female presenting on 09/25/2024 for Medication Management     Discussed the use of AI scribe software for clinical note transcription with the patient, who gave verbal consent to proceed.  History of Present Illness Heather [name] is a 44 year old female who presents for follow-up regarding weight management and medication adjustment.  She has experienced significant weight loss using the injectable medication Wegovy , which was discontinued by her insurance in October. Her weight decreased from a high of 164 pounds to a low of 138 pounds while on the medication. Since stopping Wegovy , her weight has increased to approximately 160 pounds.  She recently attempted to restart Wegovy  at a 2.4 mg dose on Saturday, which resulted in severe nausea and vomiting, requiring the use of four Zofran  tablets for relief. She describes the experience as 'real sick' and 'blew chunks.'  Her current eating habits include two to three meals a day with  snacking, and she has noticed an increase in cravings since stopping the medication. Her beverage intake includes regular [facility], and she acknowledges the need to increase her water  intake. Her physical activity is minimal, as her work is sedentary.  She has successfully quit smoking after 27 years and is motivated to improve her water  intake.  She is currently taking Klonopin , which she finds beneficial when taken in the morning, although she does not take it daily. She also uses Adderall, and there is a concern about timing refills with an upcoming cruise in March. She has a longstanding relationship with her pharmacist at [facility], where she prefers to fill her prescriptions.    Relevant past medical, surgical, family and social history reviewed and updated as indicated. Interim medical history since our last visit reviewed. Allergies and medications reviewed and updated. Outpatient Medications Prior to Visit  Medication Sig Dispense Refill   amphetamine -dextroamphetamine  (ADDERALL) 20 MG tablet Take 1 tablet (20 mg total) by mouth 2 (two) times daily. 60 tablet 0   amphetamine -dextroamphetamine  (ADDERALL) 20 MG tablet Take 1 tablet (20 mg total) by mouth 2 (two) times daily. 60 tablet 0   amphetamine -dextroamphetamine  (ADDERALL) 20 MG tablet Take 1 tablet (20 mg total) by mouth 2 (two) times daily. 60 tablet 0   clonazePAM  (KLONOPIN ) 0.5 MG tablet Take 1 tablet (0.5 mg total) by mouth daily as needed for anxiety. 30 tablet 0   cyanocobalamin  (VITAMIN B12) 1000 MCG/ML injection Inject 1 mL (1,000 mcg total) into the muscle every 14 (fourteen) days. Inject 1 ml 1000 mcg total into muscle once a month for  three months 2 mL 2   docusate sodium  (COLACE) 50 MG capsule Take 1 capsule (50 mg total) by mouth daily as needed for mild constipation. 10 capsule 0   ibuprofen  (ADVIL ) 800 MG tablet Take 1 tablet (800 mg total) by mouth every 8 (eight) hours as needed. 30 tablet 0   ondansetron  (ZOFRAN )  4 MG tablet Take 1 tablet (4 mg total) by mouth every 8 (eight) hours as needed for nausea or vomiting. 20 tablet 1   pantoprazole  (PROTONIX ) 40 MG tablet Take 1 tablet (40 mg total) by mouth daily. 90 tablet 0   semaglutide -weight management (WEGOVY ) 2.4 MG/0.75ML SOAJ SQ injection Inject 2.4 mg into the skin once a week. 9 mL 0   acetaminophen  (TYLENOL ) 325 MG tablet Take 2 tablets (650 mg total) by mouth every 6 (six) hours as needed for mild pain (pain score 1-3) or fever (or temp > 100).     No facility-administered medications prior to visit.     Per HPI unless specifically indicated in ROS section below Review of Systems  Constitutional:  Negative for chills and fever.  Respiratory:  Negative for shortness of breath.   Cardiovascular:  Negative for chest pain.   Objective:  There were no vitals taken for this visit.  Wt Readings from Last 3 Encounters:  08/20/24 143 lb 12.8 oz (65.2 kg)  05/15/24 138 lb (62.6 kg)  04/18/24 137 lb 12.8 oz (62.5 kg)       Physical exam: Gen: alert, NAD, not ill appearing Pulm: speaks in complete sentences without increased work of breathing Psych: normal mood, normal thought content      Results for orders placed or performed in visit on 08/20/24  CBC with Differential/Platelet   Collection Time: 08/20/24 10:49 AM  Result Value Ref Range   WBC 8.9 4.0 - 10.5 K/uL   RBC 4.82 3.87 - 5.11 Mil/uL   Hemoglobin 14.6 12.0 - 15.0 g/dL   HCT 57.0 63.9 - 53.9 %   MCV 89.0 78.0 - 100.0 fl   MCHC 34.0 30.0 - 36.0 g/dL   RDW 85.9 88.4 - 84.4 %   Platelets 357.0 150.0 - 400.0 K/uL   Neutrophils Relative % 58.8 43.0 - 77.0 %   Lymphocytes Relative 30.7 12.0 - 46.0 %   Monocytes Relative 8.8 3.0 - 12.0 %   Eosinophils Relative 1.1 0.0 - 5.0 %   Basophils Relative 0.6 0.0 - 3.0 %   Neutro Abs 5.2 1.4 - 7.7 K/uL   Lymphs Abs 2.7 0.7 - 4.0 K/uL   Monocytes Absolute 0.8 0.1 - 1.0 K/uL   Eosinophils Absolute 0.1 0.0 - 0.7 K/uL   Basophils Absolute  0.1 0.0 - 0.1 K/uL  Comprehensive metabolic panel with GFR   Collection Time: 08/20/24 10:49 AM  Result Value Ref Range   Sodium 136 135 - 145 mEq/L   Potassium 3.8 3.5 - 5.1 mEq/L   Chloride 103 96 - 112 mEq/L   CO2 25 19 - 32 mEq/L   Glucose, Bld 82 70 - 99 mg/dL   BUN 12 6 - 23 mg/dL   Creatinine, Ser 9.31 0.40 - 1.20 mg/dL   Total Bilirubin 0.4 0.2 - 1.2 mg/dL   Alkaline Phosphatase 68 39 - 117 U/L   AST 11 5 - 37 U/L   ALT 10 3 - 35 U/L   Total Protein 6.8 6.0 - 8.3 g/dL   Albumin 4.4 3.5 - 5.2 g/dL   GFR 891.79 >39.99 mL/min  Calcium 9.2 8.4 - 10.5 mg/dL  Hemoglobin J8r   Collection Time: 08/20/24 10:49 AM  Result Value Ref Range   Hgb A1c MFr Bld 4.9 4.6 - 6.5 %  Urine Microscopic   Collection Time: 08/20/24 10:49 AM  Result Value Ref Range   WBC, UA 0-2/hpf 0-2/hpf   RBC / HPF 0-2/hpf 0-2/hpf   Mucus, UA Presence of (A) None   Squamous Epithelial / HPF Rare(0-4/hpf) Rare(0-4/hpf)   Bacteria, UA Rare(<10/hpf) (A) None  TSH   Collection Time: 08/20/24 10:49 AM  Result Value Ref Range   TSH 2.14 0.35 - 5.50 uIU/mL  Lipid panel   Collection Time: 08/20/24 10:49 AM  Result Value Ref Range   Cholesterol 112 28 - 200 mg/dL   Triglycerides 16.9 89.9 - 149.0 mg/dL   HDL 58.09 >60.99 mg/dL   VLDL 83.3 0.0 - 59.9 mg/dL   LDL Cholesterol 53 10 - 99 mg/dL   Total CHOL/HDL Ratio 3    NonHDL 69.93   IBC + Ferritin   Collection Time: 08/20/24 10:49 AM  Result Value Ref Range   Iron 64 42 - 145 ug/dL   Transferrin 777.9 787.9 - 360.0 mg/dL   Saturation Ratios 79.3 20.0 - 50.0 %   Ferritin 21.1 10.0 - 291.0 ng/mL   TIBC 310.8 250.0 - 450.0 mcg/dL  DRUG MONITORING, PANEL 8 WITH CONFIRMATION, URINE   Collection Time: 08/20/24 10:49 AM  Result Value Ref Range   Alcohol Metabolites NEGATIVE <500 ng/mL   Amphetamines NEGATIVE <500 ng/mL   Benzodiazepines POSITIVE (A) <100 ng/mL   Alphahydroxyalprazolam NEGATIVE <25 ng/mL   Alphahydroxymidazolam NEGATIVE <50 ng/mL    Alphahydroxytriazolam NEGATIVE <50 ng/mL   Aminoclonazepam 254 (H) <25 ng/mL   Hydroxyethylflurazepam NEGATIVE <50 ng/mL   Lorazepam NEGATIVE <50 ng/mL   Nordiazepam NEGATIVE <50 ng/mL   Oxazepam NEGATIVE <50 ng/mL   Temazepam NEGATIVE <50 ng/mL   Benzodiazepines Comments     Buprenorphine, Urine NEGATIVE <5 ng/mL   Cocaine Metabolite NEGATIVE <150 ng/mL   6 Acetylmorphine NEGATIVE <10 ng/mL   Marijuana Metabolite NEGATIVE <20 ng/mL   MDMA NEGATIVE <500 ng/mL   Opiates NEGATIVE <100 ng/mL   Oxycodone  NEGATIVE <100 ng/mL   Creatinine 111.6 > or = 20.0 mg/dL   pH 5.8 4.5 - 9.0   Oxidant NEGATIVE <200 mcg/mL  DM TEMPLATE   Collection Time: 08/20/24 10:49 AM  Result Value Ref Range   Notes and Comments     Assessment & Plan:   Obesity (BMI 30-39.9)  Other orders -     Wegovy ; Take 1 tablet (1.5 mg total) by mouth daily. Daily in AM on an empty stomach with 4 oz of water . Do not eat or drink for 30 minutes after dose.  Dispense: 30 tablet; Refill: 0    Assessment and Plan Assessment & Plan Overweight Weight increased to 160 lbs after stopping Wegovy . Transitioning to oral Wegovy  due to cost. Increased cravings and snacking noted. No structured exercise routine. - Prescribed oral Wegovy  1.5 mg daily, escalate monthly as tolerated. - Instructed to take oral Wegovy  with 4 oz water , no other intake for 30 minutes, remain upright for 30 minutes. - Advised 64 oz daily water  intake for constipation. - Encouraged reduction of [facility]. - Discussed incorporating structured exercise.  Attention Deficit Hyperactivity Disorder Managed with Adderall 20 mg twice daily. Requires early refills for travel. - Ensured early refill of Adderall for travel.  Generalized Anxiety Disorder Managed with Klonopin  0.5 mg as needed. Improved mood  with morning dose. Discussed dosing schedule adjustment. - Continue Klonopin  0.5 mg as needed. - Consider half tablet in morning and half later if  needed.  I discussed the assessment and treatment plan with the patient. The patient was provided an opportunity to ask questions and all were answered. The patient agreed with the plan and demonstrated an understanding of the instructions. The patient was advised to call back or seek an in-person evaluation if the symptoms worsen or if the condition fails to improve as anticipated.  Follow up plan: Return if symptoms worsen or fail to improve, for as scheduled .  Adina Crandall, NP   "

## 2024-09-26 ENCOUNTER — Ambulatory Visit

## 2024-09-27 ENCOUNTER — Ambulatory Visit

## 2024-10-01 ENCOUNTER — Other Ambulatory Visit (HOSPITAL_COMMUNITY): Payer: Self-pay

## 2024-10-04 ENCOUNTER — Ambulatory Visit: Admitting: Nurse Practitioner

## 2024-10-07 ENCOUNTER — Other Ambulatory Visit (HOSPITAL_COMMUNITY): Payer: Self-pay

## 2024-10-07 ENCOUNTER — Encounter: Payer: Self-pay | Admitting: Pharmacy Technician

## 2024-10-07 NOTE — Telephone Encounter (Signed)
 Error

## 2024-10-14 ENCOUNTER — Ambulatory Visit: Admitting: Nurse Practitioner

## 2024-11-18 ENCOUNTER — Ambulatory Visit: Admitting: Nurse Practitioner
